# Patient Record
Sex: Female | Born: 2000 | Race: White | Hispanic: No | Marital: Married | Smoking: Never smoker
Health system: Southern US, Community
[De-identification: ages and names within clinical notes are randomized; demographics above are authoritative.]

## PROBLEM LIST (undated history)

## (undated) DIAGNOSIS — E0590 Thyrotoxicosis, unspecified without thyrotoxic crisis or storm: Secondary | ICD-10-CM

## (undated) HISTORY — DX: Thyrotoxicosis, unspecified without thyrotoxic crisis or storm: E05.90

## (undated) HISTORY — PX: NO PAST SURGERIES: SHX2092

---

## 2020-09-17 NOTE — L&D Delivery Note (Addendum)
Delivery Note:   [id number] at [redacted]w[redacted]d  Admitting diagnosis: Encounter for induction of labor [Z34.90] Risks:  History of IUFD Language barrier  Hypothyroidism no meds Anemia affecting pregnancy in third trimester-folate/b12 and Fe deficiency;  Polyhydramnios affecting pregnancy  Onset of labor: 2312 IOL/Augmentation: AROM and Pitocin ROM: 12/28 0103  Complete dilation at 09/13/2021  0102 Onset of pushing at 0108 FHR second stage 105  Analgesia /Anesthesia intrapartum:Epidural  Pushing in lithotomy  position with CNM and L&D staff support, husband present for birth and supportive.  Delivery of a Live born female  Birth Weight:  3430g APGAR: 8, 9  Newborn Delivery   Birth date/time: 09/13/2021 01:22:00 Delivery type: Vaginal, Spontaneous      in cephalic presentation, position ROA. Gentle traction applied. Shoulder dystocia noted and NICU called to room. Maneuvers used McRoberts, suprapubic pressure, and posterior arm delivered. Then baby was delivered and placed onto mom's chest. Baby was pink and crying. Placenta delivered with ease.   APGAR:1 min-8 , 5 min-9   Nuchal Cord: No  Cord double clamped after cessation of pulsation, cut by husband.  Collection of cord blood for typing completed. Cord blood donation-None  Arterial cord blood sample-No    Placenta delivered-Spontaneous  with 3 vessels . Uterotonics: IV Pitocin Placenta to L&D Uterine tone firm-midline  Bleeding light  None  laceration identified.  Episiotomy:None  Local analgesia: N/A  Repair: N/A Est. Blood Loss (mL):300.00   Complications: None  Mom to postpartum.  Baby boy Shoaibullah to Couplet care / Skin to Skin.  Delivery Report:  Review the Delivery Report for details.    Iline Oven White,SNM 09/13/2021, 1:34 AM   I was present and gloved with the student for the entire delivery. I agree with the note above.  Thressa Sheller DNP, CNM  09/13/21  4:17 AM

## 2020-10-18 ENCOUNTER — Ambulatory Visit (HOSPITAL_COMMUNITY)
Admission: EM | Admit: 2020-10-18 | Discharge: 2020-10-18 | Disposition: A | Discharge status: Home / Self Care | Payer: Self-pay | Attending: Emergency Medicine | Admitting: Emergency Medicine

## 2020-10-18 ENCOUNTER — Encounter (HOSPITAL_COMMUNITY): Payer: Self-pay | Admitting: Urgent Care

## 2020-10-18 ENCOUNTER — Other Ambulatory Visit: Payer: Self-pay

## 2020-10-18 DIAGNOSIS — H00015 Hordeolum externum left lower eyelid: Secondary | ICD-10-CM

## 2020-10-18 DIAGNOSIS — Z3202 Encounter for pregnancy test, result negative: Secondary | ICD-10-CM

## 2020-10-18 DIAGNOSIS — N926 Irregular menstruation, unspecified: Secondary | ICD-10-CM

## 2020-10-18 LAB — POCT URINALYSIS DIPSTICK, ED / UC
Bilirubin Urine: NEGATIVE
Glucose, UA: NEGATIVE mg/dL
Leukocytes,Ua: NEGATIVE
Nitrite: NEGATIVE
Protein, ur: NEGATIVE mg/dL
Specific Gravity, Urine: >=1.03 (ref 1.005–1.030)
Urobilinogen, UA: 0.2 mg/dL (ref 0.0–1.0)
pH: 5 (ref 5.0–8.0)

## 2020-10-18 LAB — POC URINE PREG, ED: Preg Test, Ur: NEGATIVE

## 2020-10-18 MED ORDER — ERYTHROMYCIN 5 MG/GM OP OINT: TOPICAL_OINTMENT | OPHTHALMIC | 0 refills | Status: DC

## 2020-10-18 NOTE — Discharge Instructions (Addendum)
Start doing warm compresses 4 times a day. You may get some baby shampoo, dilute this with warm water, and gently wipe your upper and lower eyelid while you are taking a shower. This will help prevent the recurrence of any styes or chalazions. Follow up with ophthalmologist if this does not get better within several weeks Try not to rub your eyes. You may use Systane as often as you want for comfort. Return immediately to the ER for fever above 100.4, if you have pain moving your eyes, any visual changes, nausea, vomiting, headaches, a rash around your eye, or any other concerns.  Go to www.goodrx.com to look up your medications. This will give you a list of where you can find your prescriptions at the most affordable prices. Or ask the pharmacist what the cash price is, or if they have any other discount programs available to help make your medication more affordable. This can be less expensive than what you would pay with insurance.    Below is a list of primary care practices who are taking new patients for you to follow-up with.  [facility] internal medicine clinic Ground Floor - [facility], [address] [place], [place], [place] [address] [phone]  [facility] Primary Care at [facility] [address] [place], [place] [address] [phone]  [facility] [address] [place], [place] [address] [phone]  [facility] [phone] [address] [place] [address]  [facility]: [address] [place] [address]  [phone]  [facility]: [address] [place] [address]   [phone]  [facility] Family Medicine: [address] [place] [address]  [phone]  [facility] : [address] [place] [address] [phone]  [facility] Primary  Care: [address] [place] [address] [phone]  [facility]: [address] [place] [address] [phone]  Dr. Oneal Grout [address] [facility] [place] [address]  [phone]  Dr. Jackie Plum, [facility]. [address] [place], [place] [address]  [phone]

## 2020-10-18 NOTE — ED Triage Notes (Signed)
With assistance of interpreter Pt asked questions for Pt intake. Pt has a bump on Lt lower eye lid . Pt reported both eyes are watery and hurt sometimes. Pt reports she does not wear glasses.

## 2020-10-18 NOTE — ED Provider Notes (Signed)
HPI  SUBJECTIVE:  Jessica Robbins is a 20 y.o. female who presents with 4 days of painful swelling of her left lower eyelid.  She states the pain is located in the eyelid, not the eye.  She reports increased tearing.  No purulent drainage.  No visual changes.  She does not wear contacts or glasses.  Symptoms worse with palpation.  No alleviating factors.  She has not tried anything for this.  Past medical history: None.  LMP: 1.5 months ago.  Unsure if she could be pregnant.  PMD: None.  She arrived from [place] a month and a half ago.  All history obtained through language line.    History reviewed. No pertinent past medical history.  History reviewed. No pertinent surgical history.  History reviewed. No pertinent family history.     No current facility-administered medications for this encounter.  Current Outpatient Medications:  .  erythromycin ophthalmic ointment, 1 cm ribbon to affected eyelid qid x 10 days, Disp: 5 g, Rfl: 0  No Known Allergies   ROS  As noted in HPI.   Physical Exam  BP 113/85 (BP Location: Left Arm)   Pulse 79   Temp 98 F (36.7 C) (Oral)   Resp 16   LMP  (LMP Unknown) Comment: Pt reports unsure if she is preg.  SpO2 97%   Constitutional: Well developed, well nourished, no acute distress Eyes:  EOMI, conjunctiva normal bilaterally PERRLA.  No direct or consensual photophobia.  Large tender incoming stye left lower eyelid.     HENT: Normocephalic, atraumatic,mucus membranes moist Respiratory: Normal inspiratory effort Cardiovascular: Normal rate GI: nondistended skin: No rash, skin intact Musculoskeletal: no deformities Neurologic: Alert & oriented x 3, no focal neuro deficits Psychiatric: Speech and behavior appropriate   ED Course   Medications - No data to display  Orders Placed This Encounter  Procedures  . POC Urinalysis dipstick    Standing Status:   Standing    Number of Occurrences:   1  . POC urine pregnancy    Standing  Status:   Standing    Number of Occurrences:   1    Results for orders placed or performed during the hospital encounter of 10/18/20 (from the past 24 hour(s))  POC Urinalysis dipstick     Status: Abnormal   Collection Time: 10/18/20  4:52 PM  Result Value Ref Range   Glucose, UA NEGATIVE NEGATIVE mg/dL   Bilirubin Urine NEGATIVE NEGATIVE   Ketones, ur TRACE (A) NEGATIVE mg/dL   Specific Gravity, Urine >=1.030 1.005 - 1.030   Hgb urine dipstick TRACE (A) NEGATIVE   pH 5.0 5.0 - 8.0   Protein, ur NEGATIVE NEGATIVE mg/dL   Urobilinogen, UA 0.2 0.0 - 1.0 mg/dL   Nitrite NEGATIVE NEGATIVE   Leukocytes,Ua NEGATIVE NEGATIVE  POC urine pregnancy     Status: None   Collection Time: 10/18/20  4:54 PM  Result Value Ref Range   Preg Test, Ur NEGATIVE NEGATIVE   No results found.  ED Clinical Impression  1. Hordeolum externum of left lower eyelid   2. Missed period   3. Negative pregnancy test      ED Assessment/Plan  Patient is not pregnant.  Patient has a stye in the left lower eyelid.  Advised warm compresses, lid hygiene, erythromycin ointment.  Will order primary care referral for continued care and provide primary care list.  Follow-up with Dr. Allyne Gee, ophthalmology on-call if not better in about 2 weeks  Using the language line,  discussed labs, MDM, treatment plan, and plan for follow-up with patient.  Answered all questions. patient agrees with plan.   Meds ordered this encounter  Medications  . erythromycin ophthalmic ointment    Sig: 1 cm ribbon to affected eyelid qid x 10 days    Dispense:  5 g    Refill:  0    *This clinic note was created using Scientist, clinical (histocompatibility and immunogenetics). Therefore, there may be occasional mistakes despite careful proofreading.   ?    Domenick Gong, MD 10/18/20 1731

## 2020-10-19 ENCOUNTER — Ambulatory Visit (INDEPENDENT_AMBULATORY_CARE_PROVIDER_SITE_OTHER): Payer: Self-pay | Admitting: Obstetrics and Gynecology

## 2020-10-19 ENCOUNTER — Encounter: Payer: Self-pay | Admitting: Obstetrics and Gynecology

## 2020-10-19 DIAGNOSIS — N912 Amenorrhea, unspecified: Secondary | ICD-10-CM | POA: Diagnosis not present

## 2020-10-19 DIAGNOSIS — Z1159 Encounter for screening for other viral diseases: Secondary | ICD-10-CM

## 2020-10-19 NOTE — Progress Notes (Signed)
   History:  Ms. Jessica Robbins is a 20 y.o. [id number] who presents to clinic today for assessment of amenorrhea.    Neg UPT yesterday. No period for past month and a half. She just finished breastfeeding in the past month. Not sexually active at the moment. Reports that her husband 'does not want her getting pregnant' but also that he does not want her taking birth control.   Arrived from [place] 1.5 months ago. Also endorses headaches and fatigue. [place]. Reports that she has had other times in her life where she has missed her period. Endorses food insecurity prior to arrival in [place]. Currently supported, has great host family.   The following portions of the patient's history were reviewed and updated as appropriate: allergies, current medications, family history, past medical history, social history, past surgical history and problem list.  Review of Systems:  Review of Systems  Constitutional: Positive for malaise/fatigue. Negative for chills and fever.  Cardiovascular: Negative for chest pain.  Gastrointestinal: Negative for abdominal pain and diarrhea.  Neurological: Positive for headaches. Negative for dizziness.      Objective:  Physical Exam LMP  (LMP Unknown) Comment: Pt reports unsure if she is preg. Physical Exam Constitutional:      Appearance: Normal appearance.  HENT:     Head:     Comments: Stye left eye  Pulmonary:     Effort: Pulmonary effort is normal.  Abdominal:     General: Abdomen is flat. There is no distension.     Palpations: Abdomen is soft. There is no mass.     Tenderness: There is no abdominal tenderness.  Neurological:     Mental Status: She is alert.       Labs and Imaging Results for orders placed or performed during the hospital encounter of 10/18/20 (from the past 24 hour(s))  POC Urinalysis dipstick     Status: Abnormal   Collection Time: 10/18/20  4:52 PM  Result Value Ref Range   Glucose, UA NEGATIVE  NEGATIVE mg/dL   Bilirubin Urine NEGATIVE NEGATIVE   Ketones, ur TRACE (A) NEGATIVE mg/dL   Specific Gravity, Urine >=1.030 1.005 - 1.030   Hgb urine dipstick TRACE (A) NEGATIVE   pH 5.0 5.0 - 8.0   Protein, ur NEGATIVE NEGATIVE mg/dL   Urobilinogen, UA 0.2 0.0 - 1.0 mg/dL   Nitrite NEGATIVE NEGATIVE   Leukocytes,Ua NEGATIVE NEGATIVE  POC urine pregnancy     Status: None   Collection Time: 10/18/20  4:54 PM  Result Value Ref Range   Preg Test, Ur NEGATIVE NEGATIVE    No results found.   Assessment & Plan:   Amenorrhea - suspect due to stress, recent move to [place]. Also had been breastfeeding until recently. Will check CBC, CMP, TSH given associated symptoms. -also discussed birth control options, etc. She feels safe with husband. Declines BC at this time.    Health Maintenance - welcomed to practice, discussed routine screening. Declined pap smear today. Will also screen for HCV, HIV.   Gita Kudo, MD 10/19/2020 1:05 PM    Casper Harrison, MD [facility] Family Medicine Fellow, [facility] for [facility], [facility]

## 2020-10-19 NOTE — Patient Instructions (Signed)
I think that you are not having a period right now due to stress. This can be a normal reaction to stressful events. If you continue to not have a period for 3 or more months, please follow up for further evaluation. We checked some basic labs today and I will follow up with you with the results.

## 2020-10-19 NOTE — Progress Notes (Signed)
BP 117/81 pulse 82 weight 150 lb 5'1 height  Patient reports period 1.5 months ago and has never missed a period before.

## 2020-10-20 LAB — COMPREHENSIVE METABOLIC PANEL
ALT: 18 IU/L (ref 0–32)
AST: 19 IU/L (ref 0–40)
Albumin/Globulin Ratio: 1.3 (ref 1.2–2.2)
Albumin: 4.7 g/dL (ref 3.9–5.0)
Alkaline Phosphatase: 99 IU/L (ref 44–121)
BUN/Creatinine Ratio: 17 (ref 9–23)
BUN: 9 mg/dL (ref 6–20)
Bilirubin Total: 0.2 mg/dL (ref 0.0–1.2)
CO2: 20 mmol/L (ref 20–29)
Calcium: 9.7 mg/dL (ref 8.7–10.2)
Chloride: 101 mmol/L (ref 96–106)
Creatinine, Ser: 0.54 mg/dL — ABNORMAL LOW (ref 0.57–1.00)
GFR calc Af Amer: 155 mL/min/{1.73_m2} (ref >59)
GFR calc non Af Amer: 134 mL/min/{1.73_m2} (ref >59)
Globulin, Total: 3.7 g/dL (ref 1.5–4.5)
Glucose: 101 mg/dL — ABNORMAL HIGH (ref 65–99)
Potassium: 4.9 mmol/L (ref 3.5–5.2)
Sodium: 139 mmol/L (ref 134–144)
Total Protein: 8.4 g/dL (ref 6.0–8.5)

## 2020-10-20 LAB — HCV AB W REFLEX TO QUANT PCR: HCV Ab: <0.1 s/co ratio (ref 0.0–0.9)

## 2020-10-20 LAB — CBC
Hematocrit: 31.8 % — ABNORMAL LOW (ref 34.0–46.6)
Hemoglobin: 10.8 g/dL — ABNORMAL LOW (ref 11.1–15.9)
MCH: 27.3 pg (ref 26.6–33.0)
MCHC: 34 g/dL (ref 31.5–35.7)
MCV: 80 fL (ref 79–97)
Platelets: 201 10*3/uL (ref 150–450)
RBC: 3.96 x10E6/uL (ref 3.77–5.28)
RDW: 13.4 % (ref 11.7–15.4)
WBC: 5.8 10*3/uL (ref 3.4–10.8)

## 2020-10-20 LAB — HCV INTERPRETATION

## 2020-10-20 LAB — THYROID CASCADE PROFILE: TSH: 19.1 u[IU]/mL — ABNORMAL HIGH (ref 0.450–4.500)

## 2020-10-20 LAB — HIV ANTIBODY (ROUTINE TESTING W REFLEX): HIV Screen 4th Generation wRfx: NONREACTIVE

## 2020-10-20 LAB — THYROXINE (T4) FREE, DIRECT, S: T4,Free (Direct): 0.8 ng/dL — ABNORMAL LOW (ref 0.82–1.77)

## 2020-10-21 ENCOUNTER — Other Ambulatory Visit: Payer: Self-pay | Admitting: Obstetrics and Gynecology

## 2020-10-21 MED ORDER — LEVOTHYROXINE SODIUM 100 MCG PO TABS: 100.0000 ug | ORAL_TABLET | Freq: Every day | ORAL | 11 refills | Status: DC

## 2020-10-24 ENCOUNTER — Telehealth: Payer: Self-pay | Admitting: Lactation Services

## 2020-10-24 NOTE — Telephone Encounter (Signed)
-----   Message from Gita Kudo, MD sent at 10/21/2020  1:56 PM EST ----- Patient's labs demonstrate that she is hypothyroid. I have sent levothyroxine to the pharmacy for her. Please instruct her to start taking this medication in the morning and that she should have a follow up visit with repeat labs in six weeks.    Casper Harrison, MD

## 2020-10-24 NOTE — Telephone Encounter (Signed)
Attempted to call patient with results of Thyroid labs. Not able to get a [name] via [employer] at this time, will need to try again at a later time.

## 2020-10-25 NOTE — Telephone Encounter (Signed)
Called patient with assistance of [employer], # [id number].   Female answered the phone and reports he is on the bus and is on the way to the house and will be there in about 20 minutes. He reports he will be at home the rest of the afternoon and patient can be called at that time. Will need to call again in the afternoon.

## 2020-10-25 NOTE — Telephone Encounter (Signed)
Called pt with [employer] # [phone] and informed pt that her labs show that she has hypothyroidism and that a prescription of Synthroid has been prescribed and that we have sent it her [facility] on [address] [facility] [employer]  Informed pt that someone from the front office will call her with an appt in 6 weeks to make sure that medication dosage effective. I also advised pt that she will take daily in the am.  Pt verbalized understanding.    Leonette Nutting  10/25/20

## 2020-12-06 ENCOUNTER — Ambulatory Visit: Payer: Self-pay | Admitting: Obstetrics and Gynecology

## 2020-12-14 ENCOUNTER — Encounter: Payer: Self-pay | Admitting: Obstetrics and Gynecology

## 2020-12-14 ENCOUNTER — Other Ambulatory Visit: Payer: Self-pay

## 2020-12-14 ENCOUNTER — Ambulatory Visit (INDEPENDENT_AMBULATORY_CARE_PROVIDER_SITE_OTHER): Payer: Self-pay | Admitting: Obstetrics and Gynecology

## 2020-12-14 VITALS — BP 101/66 | HR 80 | Wt 158.6 lb

## 2020-12-14 DIAGNOSIS — E039 Hypothyroidism, unspecified: Secondary | ICD-10-CM

## 2020-12-14 DIAGNOSIS — L299 Pruritus, unspecified: Secondary | ICD-10-CM

## 2020-12-14 DIAGNOSIS — N912 Amenorrhea, unspecified: Secondary | ICD-10-CM

## 2020-12-14 MED ORDER — HYDROXYZINE HCL 25 MG PO TABS: 25.0000 mg | ORAL_TABLET | Freq: Four times a day (QID) | ORAL | 2 refills | Status: DC | PRN

## 2020-12-14 MED ORDER — CETIRIZINE HCL 10 MG PO TABS: 10.0000 mg | ORAL_TABLET | Freq: Every day | ORAL | 0 refills | Status: DC

## 2020-12-14 MED ORDER — HYDROCORTISONE 1 % EX OINT: 1.0000 "application " | TOPICAL_OINTMENT | Freq: Two times a day (BID) | CUTANEOUS | 0 refills | Status: DC

## 2020-12-14 NOTE — Patient Instructions (Addendum)
  Cetaphil moisturizing lotion pr any lotion without a scent  START Zyrtec 10mg  daily Continue Synthroid daily  Take Hydroxyzine AS NEEDED for itching. Can take up to four times daily.   Cetaphil ?  ?    ?~   10 ??   ~?  100 ?~ ??   ? ?~??    ? ?   ~?. ?

## 2020-12-14 NOTE — Progress Notes (Signed)
Patient complains of rashes all over the body, headaches and back pain

## 2020-12-14 NOTE — Progress Notes (Signed)
   History:  Ms. Jessica Robbins is a 20 y.o. [id number] who presents to clinic today for follow up for hypothyroidism.  Patient seen back in February for amenorrhea. Had recently stopped breastfeeding, also had recently migrated from [place] and under significant stress. Labs drawn demonstrated hypothyroidism (TSH 19.100, T4  0.80). She was started on levothyroxine 100 mcg. Reports doing well since taking medication. Period started a few days ago. No reported side effects of medication.   Patient mainly wants to discuss diffuse itching. Says it started when she arrived in [place] and has not stopped. No one else in household is experiencing itching. No bugs or concern for infestation at home. Reports sometimes having a slight red rash on back and extremities but it is mild, never severe.   The following portions of the patient's history were reviewed and updated as appropriate: allergies, current medications, family history, past medical history, social history, past surgical history and problem list.  Review of Systems:  Review of Systems  Constitutional: Negative for chills and fever.  Cardiovascular: Negative for chest pain.  Gastrointestinal: Negative for abdominal pain.  Skin: Positive for itching and rash.      Objective:  Physical Exam There were no vitals taken for this visit. Physical Exam Vitals and nursing note reviewed.  Constitutional:      Appearance: Normal appearance.  Pulmonary:     Effort: Pulmonary effort is normal.  Abdominal:     Palpations: Abdomen is soft.  Skin:    Comments: Areas of excoriation noted on bilateral shoulders, hips. Possibly ?healing macular rash on chest, back. No papules or burrows noted.   Neurological:     Mental Status: She is alert.  Psychiatric:        Mood and Affect: Mood normal.        Behavior: Behavior normal.       Labs and Imaging No results found for this or any previous visit (from the past 24 hour(s)).  No results  found.   Assessment & Plan:  1. Hypothyroidism, unspecified type -recheck Thyroid labs today, continue synthroid   2. Amenorrhea -Improved  3. Pruritus -unclear etiology, possibly atopic dermatitis, seasonal allergies. No clear offending agent but discussed using neutral soaps, detergents. Will start daily zyretc, prn hydroxyzine, topical hydrocortisone. If no improvement would consider referral to dermatology.     Gita Kudo, MD 12/14/2020 8:57 AM    Casper Harrison, MD [facility] Family Medicine Fellow, [facility] for [facility], [facility]

## 2020-12-15 LAB — THYROID PEROXIDASE (TPO) AB: Thyroid Peroxidase (TPO) Ab: 485 IU/mL — ABNORMAL HIGH (ref 0–34)

## 2020-12-15 LAB — THYROID CASCADE PROFILE: TSH: 6.71 u[IU]/mL — ABNORMAL HIGH (ref 0.450–4.500)

## 2020-12-15 LAB — THYROXINE (T4) FREE, DIRECT, S: T4,Free (Direct): 1.23 ng/dL (ref 0.82–1.77)

## 2021-02-07 ENCOUNTER — Ambulatory Visit (INDEPENDENT_AMBULATORY_CARE_PROVIDER_SITE_OTHER): Payer: Medicaid Other

## 2021-02-07 ENCOUNTER — Other Ambulatory Visit: Payer: Self-pay

## 2021-02-07 VITALS — BP 105/65 | HR 90 | Wt 154.5 lb

## 2021-02-07 DIAGNOSIS — Z3201 Encounter for pregnancy test, result positive: Secondary | ICD-10-CM | POA: Diagnosis not present

## 2021-02-07 DIAGNOSIS — R4589 Other symptoms and signs involving emotional state: Secondary | ICD-10-CM

## 2021-02-07 LAB — POCT PREGNANCY, URINE: Preg Test, Ur: POSITIVE — AB

## 2021-02-07 MED ORDER — PROMETHAZINE HCL 25 MG PO TABS: 25.0000 mg | ORAL_TABLET | Freq: Four times a day (QID) | ORAL | 0 refills | Status: DC | PRN

## 2021-02-07 NOTE — Progress Notes (Signed)
Pt here today for pregnancy test resulting positive.  Pt reports last period was two months ago. Per chart review pt reported LMP on 12/14/20 visit of 12/13/20.  Pt denies vaginal bleeding or pain.  Pt reports nausea- Phenergan e-prescribed per standing protocol.  Pt encouraged to start PNV.  Medications/allergies reviewed.  Pt reports dizziness at times.  I advised pt to make sure that she is eating small frequent meals and drinking plenty of water.  Pt advised to start prenatal care.  Pt states that she stopped taking the Synthroid.  I encouraged her to continue taking the Synthroid as pregnancy can change the hormone imbalance and that the provider at her OB visit will reevaluate.  Pt verbalized understanding with no further questions.   Addison Naegeli, RN  02/07/21

## 2021-02-07 NOTE — Progress Notes (Signed)
I agree with the findings and care plan as documented in the RN note.    Franny Selvage, MD OB Family Medicine Fellow, [facility], [facility]  

## 2021-02-23 DIAGNOSIS — Z0289 Encounter for other administrative examinations: Secondary | ICD-10-CM | POA: Diagnosis not present

## 2021-02-23 DIAGNOSIS — Z114 Encounter for screening for human immunodeficiency virus [HIV]: Secondary | ICD-10-CM | POA: Diagnosis not present

## 2021-02-27 ENCOUNTER — Ambulatory Visit (INDEPENDENT_AMBULATORY_CARE_PROVIDER_SITE_OTHER): Payer: Medicaid Other | Admitting: *Deleted

## 2021-02-27 ENCOUNTER — Other Ambulatory Visit: Payer: Self-pay

## 2021-02-27 DIAGNOSIS — Z789 Other specified health status: Secondary | ICD-10-CM | POA: Insufficient documentation

## 2021-02-27 DIAGNOSIS — O0990 Supervision of high risk pregnancy, unspecified, unspecified trimester: Secondary | ICD-10-CM

## 2021-02-27 DIAGNOSIS — Z8759 Personal history of other complications of pregnancy, childbirth and the puerperium: Secondary | ICD-10-CM

## 2021-02-27 NOTE — Progress Notes (Addendum)
New OB Intake  Patient in office today with interpreter. Sponsor in lobby at patient preference.   I explained I am completing New OB Intake today. We discussed her EDD of 09/19/21 that is based on LMP of 12/13/20. Pt is G4/P2102. I reviewed her allergies, medications, Medical/Surgical/OB history, and appropriate screenings. I informed her of [facility] services. Based on history, this is a/an  pregnancy complicated by history IUFD  .  Patient states not taking any medications . Asked her to bring all meds she has taken or takes sometimes to next ob appointment for review.   Patient Active Problem List   Diagnosis Date Noted   Supervision of high risk pregnancy, antepartum 02/27/2021   History of IUFD 02/27/2021   Language barrier 02/27/2021    Concerns addressed today  Delivery Plans:  Plans to deliver at [facility].    Blood Pressure Cuff  Unclear what insurance patient has will follow up at next visit.  Explained after first prenatal appt pt will check  bp weekly   Weight scale: Patient    does not have weight scale. Will need to order Weight scale once insurance clarified.   Anatomy US Explained first scheduled [place] will be around 19 weeks. Anatomy US scheduled for 04/25/21 at 2:15. Pt notified to arrive at 2:00.  Labs Discussed Avelina Laine genetic screening with patient. Patient declines genetic testing.  Routine prenatal labs needed and drawn today.   Covid Vaccine Patient has covid vaccine.   Social Determinants of Health Food Insecurity: Patient denies food insecurity. WIC Referral: Patient is not interested in referral to [facility]. Patient already has WIC.  Transportation: Patient denies transportation needs. Childcare: Discussed no children allowed at ultrasound appointments. Offered childcare services; patient declines childcare services at this time.  First visit review I reviewed new OB appt with pt. I explained she will have a pelvic exam, and PAP smear. Explained pt will be seen by  Nolene Bernheim  at first visit; encounter routed to appropriate provider. Explained that patient will be seen by pregnancy navigator following visit with provider. [facility] information placed in AVS.   Alla Sloma,RN 02/27/2021  4:00 PM    Patient was assessed and managed by nursing staff during this encounter. I have reviewed the chart and agree with the documentation and plan.   Currie Paris, NP 02/27/2021 11:15 PM

## 2021-02-28 LAB — CBC/D/PLT+RPR+RH+ABO+RUBIGG...
Antibody Screen: NEGATIVE
Basophils Absolute: 0 10*3/uL (ref 0.0–0.2)
Basos: 0 %
EOS (ABSOLUTE): 0.3 10*3/uL (ref 0.0–0.4)
Eos: 3 %
HCV Ab: <0.1 s/co ratio (ref 0.0–0.9)
HIV Screen 4th Generation wRfx: NONREACTIVE
Hematocrit: 35.1 % (ref 34.0–46.6)
Hemoglobin: 11.8 g/dL (ref 11.1–15.9)
Hepatitis B Surface Ag: NEGATIVE
Immature Grans (Abs): 0 10*3/uL (ref 0.0–0.1)
Immature Granulocytes: 1 %
Lymphocytes Absolute: 2.2 10*3/uL (ref 0.7–3.1)
Lymphs: 25 %
MCH: 27.5 pg (ref 26.6–33.0)
MCHC: 33.6 g/dL (ref 31.5–35.7)
MCV: 82 fL (ref 79–97)
Monocytes Absolute: 0.6 10*3/uL (ref 0.1–0.9)
Monocytes: 7 %
Neutrophils Absolute: 5.7 10*3/uL (ref 1.4–7.0)
Neutrophils: 64 %
Platelets: 324 10*3/uL (ref 150–450)
RBC: 4.29 x10E6/uL (ref 3.77–5.28)
RDW: 14 % (ref 11.7–15.4)
RPR Ser Ql: NONREACTIVE
Rh Factor: POSITIVE
Rubella Antibodies, IGG: 28.6 index (ref >0.99)
WBC: 8.9 10*3/uL (ref 3.4–10.8)

## 2021-02-28 LAB — HEMOGLOBIN A1C
Est. average glucose Bld gHb Est-mCnc: 111 mg/dL
Hgb A1c MFr Bld: 5.5 % (ref 4.8–5.6)

## 2021-02-28 LAB — HCV INTERPRETATION

## 2021-02-28 NOTE — BH Specialist Note (Signed)
Integrated Behavioral Health Initial In-Person Visit  MRN: [id number] Name: Lakina Mcintire  Number of Integrated Behavioral Health Clinician visits:: 1/6 Session Start time: 9:25  Session End time: 10:30 Total time:  65  minutes  Types of Service: Individual psychotherapy  Interpretor:Yes.   Interpretor Name and Language: Personal assistant, in-person, Hameed Safi   Warm Hand Off Completed.         Subjective: Henreitta Spittler is a 20 y.o. female accompanied by  Sponsor and [name] Patient was referred by Nolene Bernheim, NP for positive depression screening. Patient reports the following symptoms/concerns: Pt states her primary concerns today are:  feeling scared at night alone with children while husband is at work,  wanting to find out gender of baby,  nausea/vomiting,  headache for past two days that did not go away after taking Tylenol,  numbness and tingling in hands and legs,  blue spots coming up on her legs (not bruises) when stressed,  difficulty hearing and seeing upon waking Has not been taking medication prescribed (Synthroid), as it's making her feel more nausea Has not been taking prenatal vitamin Duration of problem: Current pregnancy; Severity of problem:  moderately severe  Objective: Mood: Depressed and Affect: Depressed Risk of harm to self or others: No plan to harm self or others  Life Context: Family and Social: Pt lives with her husband and children (about 1yo and 3yo boys) School/Work: - Self-Care: - Life Changes: Current pregnancy; recent move to [place] from [place]  Patient and/or Family's Strengths/Protective Factors: Sense of purpose  Goals Addressed: Patient will: Reduce symptoms of: anxiety, depression, and stress Increase knowledge and/or ability of: healthy habits  Demonstrate ability to: Increase healthy adjustment to current life circumstances, Increase motivation to adhere to plan of care, and Improve medication  compliance  Progress towards Goals: Ongoing  Interventions: Interventions utilized: Solution-Focused Strategies and Psychoeducation and/or Health Education  Standardized Assessments completed:  PHQ9/GAD7 given in past two weeks  Patient and/or Family Response: Pt agrees to talk to medical person today regarding medical concerns, and agrees to follow up visit with [facility]  Patient Centered Plan: Patient is on the following Treatment Plan(s):  IBH  Assessment: Patient currently experiencing Adjustment disorder with mixed anxious and depressed mood.   Patient may benefit from psychoeducation and brief therapeutic interventions regarding coping with symptoms of depression and anxiety .  Plan: Follow up with behavioral health clinician on : Two weeks Behavioral recommendations:  -Talk to medical provider today regarding medical concerns -Begin taking all medications as prescribed (prenatal vitamin, phenergan, levothyroxine Referral(s): Integrated [employer] [facility] (In Clinic)  [facility] [place], [place]  Depression screen [facility] 2/9 02/27/2021 12/14/2020  Decreased Interest 3 1  Down, Depressed, Hopeless 3 1  PHQ - 2 Score 6 2  Altered sleeping 3 2  Tired, decreased energy 3 2  Change in appetite 3 2  Feeling bad or failure about yourself  0 2  Trouble concentrating 1 2  Moving slowly or fidgety/restless 0 0  Suicidal thoughts 0 0  PHQ-9 Score 16 12   GAD 7 : Generalized Anxiety Score 02/27/2021 12/14/2020  Nervous, Anxious, on Edge 3 2  Control/stop worrying 3 2  Worry too much - different things 3 2  Trouble relaxing 0 2  Restless 0 2  Easily annoyed or irritable 1 2  Afraid - awful might happen 3 2  Total GAD 7 Score 13 14

## 2021-03-01 LAB — CULTURE, OB URINE

## 2021-03-01 LAB — URINE CULTURE, OB REFLEX

## 2021-03-06 ENCOUNTER — Ambulatory Visit (INDEPENDENT_AMBULATORY_CARE_PROVIDER_SITE_OTHER): Payer: Medicaid Other | Admitting: Nurse Practitioner

## 2021-03-06 ENCOUNTER — Other Ambulatory Visit (HOSPITAL_COMMUNITY)
Admission: RE | Admit: 2021-03-06 | Discharge: 2021-03-06 | Disposition: A | Discharge status: Home / Self Care | Payer: Medicaid Other | Source: Ambulatory Visit | Attending: Nurse Practitioner | Admitting: Nurse Practitioner

## 2021-03-06 ENCOUNTER — Other Ambulatory Visit: Payer: Self-pay

## 2021-03-06 VITALS — BP 102/64 | HR 93 | Wt 155.2 lb

## 2021-03-06 DIAGNOSIS — L299 Pruritus, unspecified: Secondary | ICD-10-CM | POA: Diagnosis not present

## 2021-03-06 DIAGNOSIS — Z3A11 11 weeks gestation of pregnancy: Secondary | ICD-10-CM

## 2021-03-06 DIAGNOSIS — Z5941 Food insecurity: Secondary | ICD-10-CM | POA: Diagnosis not present

## 2021-03-06 DIAGNOSIS — R519 Headache, unspecified: Secondary | ICD-10-CM

## 2021-03-06 DIAGNOSIS — O0990 Supervision of high risk pregnancy, unspecified, unspecified trimester: Secondary | ICD-10-CM | POA: Diagnosis not present

## 2021-03-06 DIAGNOSIS — O26899 Other specified pregnancy related conditions, unspecified trimester: Secondary | ICD-10-CM | POA: Diagnosis not present

## 2021-03-06 DIAGNOSIS — O99891 Other specified diseases and conditions complicating pregnancy: Secondary | ICD-10-CM

## 2021-03-06 DIAGNOSIS — E039 Hypothyroidism, unspecified: Secondary | ICD-10-CM | POA: Diagnosis not present

## 2021-03-06 DIAGNOSIS — Z124 Encounter for screening for malignant neoplasm of cervix: Secondary | ICD-10-CM | POA: Insufficient documentation

## 2021-03-06 DIAGNOSIS — M549 Dorsalgia, unspecified: Secondary | ICD-10-CM | POA: Diagnosis not present

## 2021-03-06 DIAGNOSIS — Z603 Acculturation difficulty: Secondary | ICD-10-CM

## 2021-03-06 DIAGNOSIS — Z789 Other specified health status: Secondary | ICD-10-CM

## 2021-03-06 MED ORDER — VITAFOL ULTRA 29-0.6-0.4-200 MG PO CAPS: 1.0000 | ORAL_CAPSULE | Freq: Every day | ORAL | 11 refills | Status: AC

## 2021-03-06 MED ORDER — BLOOD PRESSURE KIT DEVI: 1.0000 | 0 refills | Status: DC

## 2021-03-06 MED ORDER — ACETAMINOPHEN 325 MG PO TABS: 650.0000 mg | ORAL_TABLET | Freq: Four times a day (QID) | ORAL | 1 refills | Status: DC | PRN

## 2021-03-06 MED ORDER — DOXYLAMINE-PYRIDOXINE 10-10 MG PO TBEC: DELAYED_RELEASE_TABLET | ORAL | 2 refills | Status: DC

## 2021-03-06 NOTE — Progress Notes (Signed)
Subjective:   Jessica Robbins is a 20 y.o. [id number] at 24w6dby LMP being seen today for her first obstetrical visit.  Her obstetrical history is significant for  refugee from [place] language barrier, hx of IUFD in first pregnancy, hypothyroidism - not currently taking medication . Patient does intend to breast feed. Pregnancy history fully reviewed.  Patient reports  headache for 3 days, nausea and vomiting especially when she is cooking .  HISTORY: OB History  Gravida Para Term Preterm AB Living  _0 0 2  SAB IAB Ectopic Multiple Live Births  0 0 0 0 2    # Outcome Date GA Lbr Len/2nd Weight Sex Delivery Anes PTL Lv  4 Current           3 Preterm  310w0d  Vag-Spont   FD  2 Term      Vag-Spont   LIV  1 Term      Vag-Spont   LIV   Past Medical History:  Diagnosis Date   Hyperthyroidism    Past Surgical History:  Procedure Laterality Date   NO PAST SURGERIES     No family history on file. Social History   Tobacco Use   Smoking status: Never   Smokeless tobacco: Never  Vaping Use   Vaping Use: Never used  Substance Use Topics   Alcohol use: Never   Drug use: Never   No Known Allergies Current Outpatient Medications on File Prior to Visit  Medication Sig Dispense Refill   cetirizine (ZYRTEC ALLERGY) 10 MG tablet Take 1 tablet (10 mg total) by mouth daily. (Patient not taking: No sig reported) 30 tablet 0   erythromycin ophthalmic ointment 1 cm ribbon to affected eyelid qid x 10 days (Patient not taking: No sig reported) 5 g 0   hydrocortisone 1 % ointment Apply 1 application topically 2 (two) times daily. (Patient not taking: No sig reported) 30 g 0   hydrOXYzine (ATARAX/VISTARIL) 25 MG tablet Take 1 tablet (25 mg total) by mouth every 6 (six) hours as needed for itching. (Patient not taking: Reported on 03/06/2021) 30 tablet 2   levothyroxine (SYNTHROID) 100 MCG tablet Take 1 tablet (100 mcg total) by mouth daily. (Patient not taking: Reported on 03/06/2021) 30  tablet 11   promethazine (PHENERGAN) 25 MG tablet Take 1 tablet (25 mg total) by mouth every 6 (six) hours as needed for nausea or vomiting. (Patient not taking: No sig reported) 30 tablet 0   No current facility-administered medications on file prior to visit.     Exam   Vitals:   03/06/21 0911  BP: 102/64  Pulse: 93  Weight: 155 lb 3.2 oz (70.4 kg)   Fetal Heart Rate (bpm): 156  Uterus:     Pelvic Exam: Perineum: no hemorrhoids, normal perineum   Vulva: normal external genitalia, no lesions   Vagina:  normal mucosa, normal discharge   Cervix: no lesions and normal, pap smear done.    Adnexa: normal adnexa and no mass, fullness, tenderness   Bony Pelvis: average  System: General: well-developed, well-nourished female in no acute distress   Breast:  normal appearance, no masses or tenderness   Skin: normal coloration and turgor, no rashes   Neurologic: oriented, normal, negative, normal mood   Extremities: normal strength, tone, and muscle mass, ROM of all joints is normal   HEENT extraocular movement intact and sclera clear, anicteric   Mouth/Teeth mucous membranes moist, pharynx normal without lesions  and dental hygiene goo   Neck supple and no masses, normal thyroid   Cardiovascular: regular rate and rhythm   Respiratory:  no respiratory distress, normal breath sounds   Abdomen: soft, non-tender; no masses,  no organomegaly     Assessment:   Pregnancy: [id number] Patient Active Problem List   Diagnosis Date Noted   Supervision of high risk pregnancy, antepartum 02/27/2021   History of IUFD 02/27/2021   Language barrier 02/27/2021     Plan:  1. Supervision of high risk pregnancy, antepartum Has had a dental cleaning. Consents to Pap smear today - she was quite anxious and expecting it to be painful, but tolerated well and reports it was not painful.  Cervix barely visualized, but pap completed.  - CBC/D/Plt+RPR+Rh+ABO+RubIgG... - Hemoglobin A1c - Culture, OB  Urine - Genetic Screening - TSH - T3 - T4, free - Blood Pressure Monitoring (BLOOD PRESSURE KIT) DEVI; 1 each by Does not apply route once a week.  Dispense: 1 each; Refill: 0  2. Language barrier In person interpreter present for the entire visit Requests AVS to be entered into google translate but office is not currently using this process  3. Headache in pregnancy, antepartum Likely cause is dehydration and lack of food due to nausea. Has not taken any medication to relieve headache. Drink at least 8 8-oz glasses of water every day. Take Tylenol 325 mg 2 tablets by mouth every 4 hours if needed for pain. Client has many ideas about her headache - thinking it came from hydroxyzine and seems reluctant to try these measures to relieve her headache  4. Back pain affecting pregnancy, antepartum Reports a fall in the past few months that is continuing to cause back pain and cramping No bruising seen on her back Likely her cramping is from mild dehydration but client does not make the connection  5. Itching Does not take the prescribed meds for itching No rash seen - skin clear Perhaps itching is due to dry skin from hypothyroidism  6. Hypothyroidism, unspecified type Only took one pill of levothroxine when prescribed - stopped taking as she was not sure she needed it. Reviewed the importance of taking this medication daily.  - TSH - T3 - T4, free  7. Food insecurity Went to the food market at her visit today.  - AMBULATORY REFERRAL TO [facility]  8.  History of IUFD This was her first pregnancy with stillbirth at 53 months of pregnancy. Has had 2 normal pregnancies since then. Has 2 boys who are almost 22 year old and 22 years of age.  Initial labs drawn. Continue prenatal vitamins. Genetic Screening discussed, NIPS: ordered. Ultrasound discussed; fetal anatomic survey:  scheduled . Problem list reviewed and updated. The nature of [facility] with multiple MDs and other Advanced Practice Providers was explained to patient; also emphasized that residents, students are part of our team. Routine obstetric precautions reviewed. Return in about 4 weeks (around 04/03/2021) for in person ROB .  Total face-to-face time with patient: 40 minutes.  Over 50% of encounter was spent on counseling and coordination of care.     Earlie Server, FNP Family Nurse Practitioner, [facility] for [employer], [facility] 03/06/2021 12:14 PM

## 2021-03-06 NOTE — Patient Instructions (Addendum)
64 ounces of water a day is needed to treat a headache Take 2 tablets of Tylenol every 6 hours as needed for headache. Take your Levothyroxine every day Vitalfol is the prenatal vitamin to take every day.

## 2021-03-06 NOTE — Progress Notes (Signed)
Patient complains of period like cramps in pelvic and that she experience this pain everyday along with lower back pains. Patient also stated that she has been having intense headaches for that past couple of days  Jessica Robbins [facility]  03/06/21

## 2021-03-07 LAB — HCV INTERPRETATION

## 2021-03-07 LAB — CBC/D/PLT+RPR+RH+ABO+RUBIGG...
Antibody Screen: NEGATIVE
Basophils Absolute: 0 10*3/uL (ref 0.0–0.2)
Basos: 0 %
EOS (ABSOLUTE): 0.2 10*3/uL (ref 0.0–0.4)
Eos: 3 %
HCV Ab: <0.1 s/co ratio (ref 0.0–0.9)
HIV Screen 4th Generation wRfx: NONREACTIVE
Hematocrit: 34.6 % (ref 34.0–46.6)
Hemoglobin: 12 g/dL (ref 11.1–15.9)
Hepatitis B Surface Ag: NEGATIVE
Immature Grans (Abs): 0 10*3/uL (ref 0.0–0.1)
Immature Granulocytes: 0 %
Lymphocytes Absolute: 1.7 10*3/uL (ref 0.7–3.1)
Lymphs: 20 %
MCH: 28 pg (ref 26.6–33.0)
MCHC: 34.7 g/dL (ref 31.5–35.7)
MCV: 81 fL (ref 79–97)
Monocytes Absolute: 0.6 10*3/uL (ref 0.1–0.9)
Monocytes: 7 %
Neutrophils Absolute: 5.9 10*3/uL (ref 1.4–7.0)
Neutrophils: 70 %
Platelets: 329 10*3/uL (ref 150–450)
RBC: 4.28 x10E6/uL (ref 3.77–5.28)
RDW: 12.9 % (ref 11.7–15.4)
RPR Ser Ql: NONREACTIVE
Rh Factor: POSITIVE
Rubella Antibodies, IGG: 26.5 index (ref >0.99)
WBC: 8.5 10*3/uL (ref 3.4–10.8)

## 2021-03-07 LAB — HEMOGLOBIN A1C
Est. average glucose Bld gHb Est-mCnc: 105 mg/dL
Hgb A1c MFr Bld: 5.3 % (ref 4.8–5.6)

## 2021-03-07 LAB — T4, FREE: Free T4: 1 ng/dL (ref 0.82–1.77)

## 2021-03-07 LAB — T3: T3, Total: 233 ng/dL — ABNORMAL HIGH (ref 71–180)

## 2021-03-07 LAB — TSH: TSH: 4.23 u[IU]/mL (ref 0.450–4.500)

## 2021-03-08 ENCOUNTER — Encounter (HOSPITAL_COMMUNITY): Payer: Self-pay | Admitting: Emergency Medicine

## 2021-03-08 ENCOUNTER — Other Ambulatory Visit: Payer: Self-pay | Admitting: *Deleted

## 2021-03-08 ENCOUNTER — Emergency Department (HOSPITAL_COMMUNITY)
Admission: EM | Admit: 2021-03-08 | Discharge: 2021-03-08 | Disposition: A | Discharge status: Home / Self Care | Payer: Medicaid Other | Attending: Emergency Medicine | Admitting: Emergency Medicine

## 2021-03-08 ENCOUNTER — Encounter: Payer: Self-pay | Admitting: *Deleted

## 2021-03-08 ENCOUNTER — Ambulatory Visit (INDEPENDENT_AMBULATORY_CARE_PROVIDER_SITE_OTHER): Payer: Medicaid Other | Admitting: Clinical

## 2021-03-08 ENCOUNTER — Other Ambulatory Visit: Payer: Self-pay

## 2021-03-08 DIAGNOSIS — M79641 Pain in right hand: Secondary | ICD-10-CM | POA: Diagnosis not present

## 2021-03-08 DIAGNOSIS — O219 Vomiting of pregnancy, unspecified: Secondary | ICD-10-CM | POA: Insufficient documentation

## 2021-03-08 DIAGNOSIS — M79606 Pain in leg, unspecified: Secondary | ICD-10-CM | POA: Insufficient documentation

## 2021-03-08 DIAGNOSIS — Z3201 Encounter for pregnancy test, result positive: Secondary | ICD-10-CM

## 2021-03-08 DIAGNOSIS — O26891 Other specified pregnancy related conditions, first trimester: Secondary | ICD-10-CM | POA: Diagnosis not present

## 2021-03-08 DIAGNOSIS — R202 Paresthesia of skin: Secondary | ICD-10-CM | POA: Diagnosis not present

## 2021-03-08 DIAGNOSIS — Z3A11 11 weeks gestation of pregnancy: Secondary | ICD-10-CM | POA: Diagnosis not present

## 2021-03-08 DIAGNOSIS — O99891 Other specified diseases and conditions complicating pregnancy: Secondary | ICD-10-CM | POA: Diagnosis not present

## 2021-03-08 DIAGNOSIS — R2240 Localized swelling, mass and lump, unspecified lower limb: Secondary | ICD-10-CM | POA: Diagnosis not present

## 2021-03-08 DIAGNOSIS — R230 Cyanosis: Secondary | ICD-10-CM | POA: Diagnosis not present

## 2021-03-08 DIAGNOSIS — F4323 Adjustment disorder with mixed anxiety and depressed mood: Secondary | ICD-10-CM | POA: Diagnosis not present

## 2021-03-08 LAB — CYTOLOGY - PAP
Chlamydia: NEGATIVE
Comment: NEGATIVE
Comment: NORMAL
Diagnosis: NEGATIVE
Neisseria Gonorrhea: NEGATIVE

## 2021-03-08 LAB — CULTURE, OB URINE

## 2021-03-08 LAB — URINE CULTURE, OB REFLEX

## 2021-03-08 MED ORDER — PROMETHAZINE HCL 25 MG PO TABS: 25.0000 mg | ORAL_TABLET | Freq: Four times a day (QID) | ORAL | 0 refills | Status: DC | PRN

## 2021-03-08 NOTE — ED Provider Notes (Signed)
[facility] EMERGENCY DEPARTMENT Provider Note   CSN: [id number] Arrival date & time: 03/08/21  1101     History No chief complaint on file.   Jessica Robbins is a 20 y.o. female.  HPI Patient is a 20 year old G21 female who is about [redacted] weeks pregnant who presents to the emergency department with multiple complaints.  History obtained via [name].  She states she has been experiencing intermittent numbness and tingling in the hands that is worse with activity.  Also complaining of leg cramping as well as intermittent leg discoloration.  She states that when she stands for long periods of time she will get leg swelling but this quickly resolves when off of her feet.  She is right-hand dominant.  No trauma.  She has been also experiencing intermittent nausea and vomiting since she became pregnant.  She was taking doxylamine pyridoxine but when her nausea/vomiting resolved she discontinued taking this medication.  Patient is a refugee from [place] and her sponsor is at bedside.  Her sponsor states that she had an abnormal TSH and was started on Synthroid but she discontinued this medication due to worsening nausea/vomiting when taking it.  She states that she recently had her TSH rechecked and was told she could discontinue this.  She denies any URI symptoms, chest pain, shortness of breath, abdominal pain, vaginal bleeding, vaginal discharge.    Past Medical History:  Diagnosis Date   Hyperthyroidism     Patient Active Problem List   Diagnosis Date Noted   Supervision of high risk pregnancy, antepartum 02/27/2021   History of IUFD 02/27/2021   Language barrier 02/27/2021    Past Surgical History:  Procedure Laterality Date   NO PAST SURGERIES       OB History     Gravida  4   Para  3   Term  2   Preterm  1   AB  0   Living  2      SAB  0   IAB  0   Ectopic  0   Multiple  0   Live Births  2           No family history on  file.  Social History   Tobacco Use   Smoking status: Never   Smokeless tobacco: Never  Vaping Use   Vaping Use: Never used  Substance Use Topics   Alcohol use: Never   Drug use: Never    Home Medications Prior to Admission medications   Medication Sig Start Date End Date Taking? Authorizing Provider  acetaminophen (TYLENOL) 325 MG tablet Take 2 tablets (650 mg total) by mouth every 6 (six) hours as needed. 03/06/21   Virginia Rochester, NP  Blood Pressure Monitoring (BLOOD PRESSURE KIT) DEVI 1 each by Does not apply route once a week. 03/06/21   Burleson, Rona Ravens, NP  cetirizine (ZYRTEC ALLERGY) 10 MG tablet Take 1 tablet (10 mg total) by mouth daily. Patient not taking: No sig reported 12/14/20   Janet Berlin, MD  Doxylamine-Pyridoxine (DICLEGIS) 10-10 MG TBEC Take 2 tablets at bedtime and one in the morning and one in the afternoon as needed for nausea. 03/06/21   Virginia Rochester, NP  erythromycin ophthalmic ointment 1 cm ribbon to affected eyelid qid x 10 days Patient not taking: No sig reported 10/18/20   Melynda Ripple, MD  hydrocortisone 1 % ointment Apply 1 application topically 2 (two) times daily. Patient not taking: No sig reported 12/14/20  Janet Berlin, MD  hydrOXYzine (ATARAX/VISTARIL) 25 MG tablet Take 1 tablet (25 mg total) by mouth every 6 (six) hours as needed for itching. Patient not taking: Reported on 03/06/2021 12/14/20   Janet Berlin, MD  levothyroxine (SYNTHROID) 100 MCG tablet Take 1 tablet (100 mcg total) by mouth daily. Patient not taking: Reported on 03/06/2021 10/21/20 10/21/21  Janet Berlin, MD  Prenat-Fe Poly-Methfol-FA-DHA (VITAFOL ULTRA) 29-0.6-0.4-200 MG CAPS Take 1 capsule by mouth daily. 03/06/21 04/05/21  Virginia Rochester, NP  promethazine (PHENERGAN) 25 MG tablet Take 1 tablet (25 mg total) by mouth every 6 (six) hours as needed for nausea or vomiting. 03/08/21   Griffin Basil, MD    Allergies    Patient has no known  allergies.  Review of Systems   Review of Systems  All other systems reviewed and are negative. Ten systems reviewed and are negative for acute change, except as noted in the HPI.   Physical Exam Updated Vital Signs BP (!) 101/59 (BP Location: Right Arm)   Pulse 78   Temp (!) 97.4 F (36.3 C) (Oral)   Resp 15   LMP 12/13/2020 (Exact Date)   SpO2 99%   Physical Exam Vitals and nursing note reviewed.  Constitutional:      General: She is not in acute distress.    Appearance: Normal appearance. She is normal weight. She is not ill-appearing, toxic-appearing or diaphoretic.  HENT:     Head: Normocephalic and atraumatic.     Right Ear: External ear normal.     Left Ear: External ear normal.     Nose: Nose normal.     Mouth/Throat:     Mouth: Mucous membranes are moist.     Pharynx: Oropharynx is clear. No oropharyngeal exudate or posterior oropharyngeal erythema.  Eyes:     Extraocular Movements: Extraocular movements intact.  Cardiovascular:     Rate and Rhythm: Normal rate and regular rhythm.     Pulses: Normal pulses.  Pulmonary:     Effort: Pulmonary effort is normal.  Abdominal:     General: Abdomen is flat.     Palpations: Abdomen is soft.     Tenderness: There is no abdominal tenderness.  Musculoskeletal:        General: Normal range of motion.     Cervical back: Normal range of motion and neck supple. No tenderness.     Right lower leg: No edema.     Left lower leg: No edema.     Comments: No pedal edema appreciated.  2+ DP pulses.  No discoloration noted in the lower extremities.  Skin:    General: Skin is warm and dry.  Neurological:     General: No focal deficit present.     Mental Status: She is alert and oriented to person, place, and time.     Comments: A&O x3.  Moving all 4 extremities with ease.  Strength is 5/5 in all 4 extremities.  Distal sensation intact.  Positive Tinel's sign in the right wrist.  Psychiatric:        Mood and Affect: Mood normal.         Behavior: Behavior normal.   ED Results / Procedures / Treatments   Labs (all labs ordered are listed, but only abnormal results are displayed) Labs Reviewed - No data to display  EKG None  Radiology No results found.  Procedures Procedures   Medications Ordered in ED Medications - No data to display  ED Course  I have reviewed the triage vital signs and the nursing notes.  Pertinent labs & imaging results that were available during my care of the patient were reviewed by me and considered in my medical decision making (see chart for details).    MDM Rules/Calculators/A&P                          Patient is a 20 year old female who presents to the emergency department due to numbness/tingling in the hands as well as intermittent leg cramping.  Symptoms all worsen with activity.  Physical exam mostly reassuring.  She does have a positive Tinel's sign on the right.  She is also right-hand dominant and states that she frequently makes bread and her symptoms worsen after doing this.  Feel that she likely is experiencing a degree of carpal tunnel syndrome in both hands, right greater than left.  We discussed stretching the wrist.  Reduction in activity.  She was given a wrist splint for comfort.  She is a refugee from [place] and her sponsor is at bedside.  Her sponsor states she is going to speak to her husband about helping out with chores around the home and states that she will also help find her a bread maker to help reduce stress to the hands.  Patient also experiencing intermittent nausea and vomiting since her pregnancy began.  She was taking doxylamine pyridoxine but states that she discontinued this medication when her nausea/vomiting resolved.  I told patient as well as her sponsor that she needs to restart this medication and for it to adequately work she will need to continually take this.  Per records, it appears that she was also prescribed a course of Phenergan  for nausea/vomiting.  Per records, patient had her TSH and T4 rechecked 2 days ago and they were within normal limits.  Agreed that patient can discontinue synthroid and discussed this with patient and her sponsor.   Recommended that she follow-up with her OB/GYN as needed.  Return to the emergency department if her symptoms should worsen.  Feel that she is stable for discharge at this time and both patient and her sponsor are agreeable.  They verbalized understanding of the above plan.  Their questions were answered and they were amicable at the time of discharge.  Final Clinical Impression(s) / ED Diagnoses Final diagnoses:  Right hand pain   Rx / DC Orders ED Discharge Orders     None        Rayna Sexton, PA-C 03/08/21 1251    Dorie Rank, MD 03/09/21 [phone]

## 2021-03-08 NOTE — ED Triage Notes (Signed)
Pt here from home with c/o right arm pain and numbness along with pain in her legs when she walks ,pt is [redacted] weeks pregnant

## 2021-03-08 NOTE — Progress Notes (Addendum)
Patient in office seeing [facility]  with complaints of headache, pain and numbness, blue spot on leg, needs refill. Asked to talk to patient about these complaints. Jessica Robbins has her sponsor present and Interpreter [employer], Language resources. Jessica Robbins c/o headache for a few days,severe at times, takes tylenol sometimes without relief. Also requests refill phenergan, which I informed her I can send in. She also c/o pain in right shoulder and numbness in legs , right hand sometimes. I discussed with Dr. Donavan Foil and we discussed she is [redacted] weeks pregnant with hypothyroid recently restarted meds.  We discussed this is likely not an ob problem and we recommend she go to [facility] ER to be evaluated for the pain and numbness, and headache. Patient worried about her other children. Her sponsor said she will work with husband to work something out and will take patient to [facility] ER now. Patient voices understanding. Kari Kerth,RN  Addendum: patient walking, talking, without difficulty. Shyheem Whitham,RN

## 2021-03-08 NOTE — Discharge Instructions (Addendum)
I have placed you in a right wrist splint.  Please wear this throughout the day for support and hopefully to help with your pain.  I would recommend you continue stretching the right wrist as well.  If you develop worsening symptoms you can always come back to the emergency department.  Please restart taking your nausea/vomiting medication that you are prescribed by your doctor.  It was a pleasure to meet you both.  ###############  ??? ????? ?? ????? ????? ??????. ???? ?????? ??? ???? ????? ?????? ??? ????? ????? ?? ????? ?? ????. ??? ?? ????? ?????????? ?? ?? ???? ?????? ?????.  ??? ???? ???? ????? ??????? ? ?????? ?????? ?????? ??? ??? ???????.  ???? ????? ????? ???? ??????? / ????? ???? ???? ?? ?????.  ??? ?? ????? ????? ?????????. laqad wadaetuk fi jabirat miesamuk alyumnaa. yurjaa airtida'an hadha tawal alyawm lilhusul ealaa aldaem wanamal 'an tusaeid fi 'alumka. 'awadu 'an 'uwsiak bialaistimrar fi madi rusghik al'ayman aydan.

## 2021-03-14 NOTE — BH Specialist Note (Deleted)
Integrated Behavioral Health Follow Up In-Person Visit  MRN: [id number] Name: Jessica Robbins  Number of Integrated Behavioral Health Clinician visits: 2/6 Session Start time: 2:45***  Session End time: 3:45*** Total time: {IBH Total Time:21014050} minutes  Types of Service: Individual psychotherapy  Interpretor:Yes.   Interpretor Name and Language: ***  Subjective: Jessica Robbins is a 20 y.o. female accompanied by {Patient accompanied [phone]} Patient was referred by *** for ***. Patient reports the following symptoms/concerns: *** Duration of problem: ***; Severity of problem: {Mild/Moderate/Severe:20260}  Objective: Mood: {BHH MOOD:22306} and Affect: {BHH AFFECT:22307} Risk of harm to self or others: {CHL AMB BH Suicide Current Mental Status:21022748}  Life Context: Family and Social: *** School/Work: *** Self-Care: *** Life Changes: ***  Patient and/or Family's Strengths/Protective Factors: {CHL AMB BH PROTECTIVE FACTORS:[phone]}  Goals Addressed: Patient will:  Reduce symptoms of: {IBH Symptoms:21014056}   Increase knowledge and/or ability of: {IBH Patient Tools:21014057}   Demonstrate ability to: {IBH Goals:21014053}  Progress towards Goals: {CHL AMB BH PROGRESS TOWARDS [phone]}  Interventions: Interventions utilized:  {IBH Interventions:21014054} Standardized Assessments completed: {IBH Screening Tools:21014051}  Patient and/or Family Response: ***  Patient Centered Plan: Patient is on the following Treatment Plan(s): *** Assessment: Patient currently experiencing ***.   Patient may benefit from ***.  Plan: Follow up with behavioral health clinician on : *** Behavioral recommendations: *** Referral(s): {IBH Referrals:21014055} "From scale of 1-10, how likely are you to follow plan?": ***  Jessica Close Mareta Chesnut, LCSW

## 2021-03-16 ENCOUNTER — Other Ambulatory Visit: Payer: Self-pay

## 2021-03-17 ENCOUNTER — Encounter: Payer: Self-pay | Admitting: *Deleted

## 2021-03-23 ENCOUNTER — Other Ambulatory Visit: Payer: Self-pay

## 2021-03-23 ENCOUNTER — Encounter: Payer: Self-pay | Admitting: *Deleted

## 2021-03-28 ENCOUNTER — Other Ambulatory Visit: Payer: Self-pay

## 2021-03-29 NOTE — BH Specialist Note (Addendum)
Integrated Behavioral Health Follow Up In-Person Visit  MRN: [id number] Name: Jessica Robbins  Number of Integrated Behavioral Health Clinician visits: 2/6 Session Start time: 9:30  Session End time: 10:30 Total time: 60 minutes  Types of Service: Individual psychotherapy  Interpretor:Yes.   Interpretor Name and Language: In-person, Personal assistant (did not get name)  Subjective: Jessica Robbins is a 20 y.o. female accompanied by  [name] Interpreter Patient was referred by Nolene Bernheim, NP for anxiety and depression. Patient reports the following symptoms/concerns: Pt states her primary concerns today are fear of "feeling like someone is behind me", difficulty with memory, lack of concentration, anxious, worry, depressed, sleep difficulty (5-6 hours throughout day/night), and numbness in hands prior to headache; noticing more nausea and vomiting with headache; symptoms all began after arriving in [place].  Duration of problem: Increase over past 6 months; Severity of problem:  moderately severe  Objective: Mood: Anxious and Depressed and Affect: Appropriate Risk of harm to self or others: No plan to harm self or others  Life Context: Family and Social: Pt lives with her husband and children (1yo; 3yo boys) School/Work: - Self-Care: - Life Changes: Move to the [place] from [place] and current pregnancy   Patient and/or Family's Strengths/Protective Factors: Social connections  Goals Addressed: Patient will:  Reduce symptoms of: anxiety, depression, insomnia, and stress   Increase knowledge and/or ability of: healthy habits and self-management skills   Demonstrate ability to: Increase healthy adjustment to current life circumstances and Increase motivation to adhere to plan of care  Progress towards Goals: Ongoing  Interventions: Interventions utilized:  Mindfulness or Management consultant, Sleep Hygiene, and Psychoeducation and/or Health Education Standardized Assessments completed: Not  Needed  Patient and/or Family Response: Pt agrees to treatment plan  Patient Centered Plan: Patient is on the following Treatment Plan(s): IBH Assessment: Patient currently experiencing continued psychoeducation and brief therapeutic interventions regarding coping with symptoms of anxiety and depression .   Patient may benefit from continued psychoeducation and brief therapeutic interventions regarding coping with symptoms of anxiety, depression, stress .  Plan: Follow up with behavioral health clinician on : Two weeks Behavioral recommendations:  -CALM relaxation breathing exercise twice daily(morning; at bedtime); consider using this when feeling stress in body -Begin taking prenatal vitamin and prescribed medications as directed by medical provider -Consider keeping track of headache days (how often are they happening and does anything make them better or worse?) Referral(s): Integrated [facility] (In Clinic)  Valetta Close [place], [place]  Depression screen [facility] 2/9 03/06/2021 02/27/2021 12/14/2020  Decreased Interest 1 3 1   Down, Depressed, Hopeless 1 3 1   PHQ - 2 Score 2 6 2   Altered sleeping 1 3 2   Tired, decreased energy 1 3 2   Change in appetite 0 3 2  Feeling bad or failure about yourself  0 0 2  Trouble concentrating 3 1 2   Moving slowly or fidgety/restless 0 0 0  Suicidal thoughts 0 0 0  PHQ-9 Score 7 16 12    GAD 7 : Generalized Anxiety Score 03/06/2021 02/27/2021 12/14/2020  Nervous, Anxious, on Edge 2 3 2   Control/stop worrying 2 3 2   Worry too much - different things 2 3 2   Trouble relaxing 2 0 2  Restless 2 0 2  Easily annoyed or irritable 2 1 2   Afraid - awful might happen 2 3 2   Total GAD 7 Score 14 13 14

## 2021-04-04 ENCOUNTER — Other Ambulatory Visit: Payer: Self-pay

## 2021-04-04 ENCOUNTER — Encounter: Payer: Medicaid Other | Admitting: Student

## 2021-04-04 ENCOUNTER — Ambulatory Visit (INDEPENDENT_AMBULATORY_CARE_PROVIDER_SITE_OTHER): Payer: Medicaid Other | Admitting: Student

## 2021-04-04 VITALS — BP 108/69 | HR 90 | Wt 155.3 lb

## 2021-04-04 DIAGNOSIS — E0590 Thyrotoxicosis, unspecified without thyrotoxic crisis or storm: Secondary | ICD-10-CM | POA: Insufficient documentation

## 2021-04-04 DIAGNOSIS — O0990 Supervision of high risk pregnancy, unspecified, unspecified trimester: Secondary | ICD-10-CM

## 2021-04-04 DIAGNOSIS — Z8759 Personal history of other complications of pregnancy, childbirth and the puerperium: Secondary | ICD-10-CM

## 2021-04-04 DIAGNOSIS — Z3A16 16 weeks gestation of pregnancy: Secondary | ICD-10-CM

## 2021-04-04 LAB — POCT URINALYSIS DIP (DEVICE)
Bilirubin Urine: NEGATIVE
Glucose, UA: NEGATIVE mg/dL
Hgb urine dipstick: NEGATIVE
Ketones, ur: NEGATIVE mg/dL
Leukocytes,Ua: NEGATIVE
Nitrite: NEGATIVE
Protein, ur: NEGATIVE mg/dL
Specific Gravity, Urine: 1.02 (ref 1.005–1.030)
Urobilinogen, UA: 0.2 mg/dL (ref 0.0–1.0)
pH: 7 (ref 5.0–8.0)

## 2021-04-04 NOTE — Progress Notes (Signed)
   PRENATAL VISIT NOTE  Subjective:  Jessica Robbins is a 20 y.o. [id number] at [redacted]w[redacted]d being seen today for ongoing prenatal care.  She is currently monitored for the following issues for this high-risk pregnancy and has Supervision of high risk pregnancy, antepartum; History of IUFD; Language barrier; and Hyperthyroidism on their problem list.  Patient reports backache and feeling dizzy, anxious.  . She reports no appetite. She reports that she is only taking prenatal and "another one". SHe does not take her other medicines: vistaril, certizine, synthroid. She does not know when she last took her medicine. She has a rash that is bothering her but she has not been using her cream.   Contractions: Not present. Vag. Bleeding: None.  Movement: Absent. Denies leaking of fluid.   The following portions of the patient's history were reviewed and updated as appropriate: allergies, current medications, past family history, past medical history, past social history, past surgical history and problem list.   Objective:   Vitals:   04/04/21 0905  BP: 108/69  Pulse: 90  Weight: 155 lb 4.8 oz (70.4 kg)    Fetal Status: Fetal Heart Rate (bpm): 148   Movement: Absent     General:  Alert, oriented and cooperative. Patient is in no acute distress.  Skin: Skin is warm and dry. No rash noted.   Cardiovascular: Normal heart rate noted  Respiratory: Normal respiratory effort, no problems with respiration noted  Abdomen: Soft, gravid, appropriate for gestational age.  Pain/Pressure: Present     Pelvic: Cervical exam deferred        Extremities: Normal range of motion.  Edema: None  Mental Status: Normal mood and affect. Normal behavior. Normal judgment and thought content.   Assessment and Plan:  Pregnancy: [id number] at [redacted]w[redacted]d 1. Supervision of high risk pregnancy, antepartum -will redraw TSH labs today; explained that her thyroid may be out of order and that could cause some of her symptoms. Also encouraged her to  drink liquids and take her nausea medicine.  -patient to bring all of her medicines with her next visit to discuss with provider -patient to go to drugstore and get prescriptions filled ; explained that her RX are waiting for her and she should fill them and take them -discussed death of baby 5 years ago; support and reassurance given, explained that we will be doing [place] and testing at 24 weeks  Preterm labor symptoms and general obstetric precautions including but not limited to vaginal bleeding, contractions, leaking of fluid and fetal movement were reviewed in detail with the patient. Please refer to After Visit Summary for other counseling recommendations.   Return in about 1 week (around 04/11/2021), or RN to talk about medicines and go over what she is taking.  Future Appointments  Date Time Provider Department Center  04/11/2021  9:15 AM [facility] HEALTH CLINICIAN [facility]  04/14/2021  9:55 AM Allayne Stack, DO [facility]  04/27/2021  8:45 AM [facility] NURSE [facility]  04/27/2021  9:00 AM [facility] US1 [facility]    Marylene Land, CNM

## 2021-04-04 NOTE — Patient Instructions (Signed)
AREA PEDIATRIC/FAMILY PRACTICE PHYSICIANS  Central/Southeast [place] ([address]) [facility] Chambliss, MD; Eniola, MD; Hale, MD; Hensel, MD; McDiarmid, MD; McIntyer, MD; Neal, MD; Walden, MD [address] [place], [place] [address] [phone] Mon-Fri 8:30-12:30, 1:30-5:00 Providers come to see babies at [facility] Accepting Medicaid [facility] providers who accept newborns: Koirala, MD; Morrow, MD; Wolters, MD [address], [place], [place] [address] [phone] Mon-Fri 8:00-5:30 Babies seen by providers at [facility] Does NOT accept Medicaid Please call early in hospitalization for appointment (limited availability)  [facility] Mulberry, MD [address] [place], [place] [address] [phone] Mon, Tue, Thur, Fri 8:30-5:00, Wed 10:00-7:00 (closed 1-2pm) Babies seen by [facility] providers Accepting Medicaid Rubin - Pediatrician Rubin, MD [address], [place], [place] [address] [phone] Mon-Fri 8:30-5:00, Sat 8:30-12:00 Provider comes to see babies at [facility] Accepting Medicaid Must have been referred from current patients or contacted office prior to delivery [facility] ([facility]) Brown, MD; Chandler, MD; Ettefagh, MD; Grant, MD; Lester, MD; McCormick, MD; McQueen, MD; Prose, MD; Simha, MD; Stanley, MD; Stryffeler, NP; Tebben, NP [address], [place], [place] [address] [phone] Mon, Tue, Thur, Fri 8:30-5:30, Wed 9:30-5:30, Sat 8:30-12:30 Babies seen by [facility] providers Accepting Medicaid Only accepting infants of first-time parents or siblings of current patients Hospital discharge coordinator will make follow-up appointment Jack Amos [address], [place], [place]  [address] [phone]   Fax - [phone] [facility] [address], [place], [place]  [address] Phone - [phone]   Fax - [phone] Shilpa Gosrani [address], [place], [place]  [address] [phone]  East/Northeast [place] ([address]) [facility] Bates, MD; Brassfield, MD; Cooper, Cox, MD; MD; Davis, MD; Dovico, MD; Ettefaugh, MD; Little, MD; Lowe, MD; Keiffer, MD; Melvin, MD; Sumner, MD; Williams, MD [address], [place], [place] [address] [phone] Mon-Fri 8:30-5:00 (extended evenings Mon-Thur as needed), Sat-Sun 10:00-1:00 Providers come to see babies at [facility] Accepting Medicaid for families of first-time babies and families with all children in the household age 3 and under. Must register with office prior to making appointment (M-F only). [facility] Henson, NP; Knapp, MD; Lalonde, MD; Tysinger, PA [address] [place], [place] [address] [phone] Mon-Fri 8:00-5:00 Babies seen by providers at [facility] Does NOT accept Medicaid/Commercial Insurance Only [facility] ([facility])  Artis, MD; Barnes, MD; Bratton, MD; Coccaro, MD; Lockett Gardner, MD; Kramer, MD; Marshall, MD; Netherton, MD; Poleto, MD; Skinner, MD [address] [place], [place] [address] [phone] Mon-Fri 8:30-5:30, Sat (Oct.-Mar.) 9:00-1:00 Babies seen by providers at [facility] Accepting Medicaid  [place] ([address]) [facility] Reid, MD; Warner, MD [address], [place], [place] [address] [phone] Mon-Fri 8:30-5:00, Sat 8:30-12:00 Providers come to see babies at [facility] Does NOT accept Medicaid [facility] Becker, PA; Hagler, MD; Scifres, PA; Sun, MD; Swayne, MD [address], [place], [place] [address] [phone] Mon-Fri 8:00-5:00 Babies seen by providers at [facility] Does NOT accept Medicaid Only accepting babies of parents who  are patients Please call early in hospitalization for appointment (limited availability) [facility] Clark, MD; Frye, MD; Kelleher, MD; Mack, NP; Miller, MD; O'Keller, MD; Patterson, NP; Pudlo, MD; Puzio, MD; Thomas, MD; Tucker, MD; Twiselton, MD [address], [place], [place] [address] [phone] Mon-Fri 8:00-5:00, Sat 9:00-12:00 Providers come to see babies at [facility] Does NOT accept Medicaid  [place] ([address]) [facility] providers accepting new patients: Brake, NP; Wharton, PA [address], [place], [place] [address] [phone] Mon-Fri 8:00-5:00 Babies seen by providers at [facility] Does NOT accept Medicaid Only accepting babies of parents who are patients Please call early in hospitalization for appointment (limited availability) [facility] Gay, MD; Quinlan, MD [address] [place], [place] [address] [phone] (press 1 to schedule appointment) Mon-Fri 8:00-5:00 Providers come to see babies at [facility] Does NOT accept Medicaid [facility] Mazer, MD [address] [place], [place] [address] [phone] Mon-Fri 8:30-5:00 (lunch 12:30-1:00), extended hours by appointment only Wed 5:00-6:30 Babies seen by [facility] providers Accepting Medicaid [facility] Banks, MD; Jordan, MD; Koberlein, MD [address], [place], [place] [address] [phone] Mon-Fri 8:00-5:00 Babies seen by [facility] providers Does NOT accept Medicaid [facility] Parker, MD; Hunter, MD; Wallace, DO [address] [place], [place] [address] [phone] Mon-Fri 8:00-5:00 Babies seen by [facility] providers Does NOT accept Medicaid [facility] Brandon, PA; Brecken, PA; Christy, NP; Dees, MD; DeClaire, MD; DeWeese, MD; Hansen, NP; Mills, NP; Parrish, NP; Smoot, NP; Summer, MD; Vapne,  MD [address] [place], [place] [address] [phone] Mon-Fri 8:30-5:00, Sat 10:00-1:00 Providers come to see babies at [facility] Does NOT accept Medicaid Free prenatal information session Tuesdays at 4:45pm [facility] Bouska, MD; Gordon, PA; Jeffery, PA; Weber, PA [address] [place] [address] [phone] Mon-Fri 7:30-5:30 Babies seen by [facility] providers [facility] [address], [place], [place]  [address] [phone]   Fax - [phone]  [place] ([address] & [address]) [facility] [place] [address] [place], [place] [address] [phone] Mon-Thur 8:00-6:00 Providers come to see babies at [facility] Accepting Medicaid [facility] Anderson, NP; Badger, MD; Beal, PA; Spencer, PA [address] [place], [place] [address] [phone] Mon-Thur 7:30-7:30, Fri 7:30-4:30 Babies seen by [facility] providers Accepting Medicaid [facility] Agbuya, MD; Klett, NP; Romgoolam, MD [address], [place], [place] [address] [phone] Mon-Fri 8:30-5:00, Sat 8:30-12:00 Providers come to see babies at [facility] Accepting Medicaid Must have "Meet & Greet" appointment at office prior to delivery [facility] ([facility]) McCord, MD; Wallace, MD; Wood, MD [address], [place], [place] [address] [phone] Mon-Wed 8:00-6:00, Thur-Fri 8:00-5:00, Sat 9:00-12:00 Providers come to see babies at [facility] Does NOT accept Medicaid Only accepting siblings of current patients [facility]  [address], [place], [place]  [address] [phone]   Fax - [phone] [facility] [address], [place], [place]  [address] [phone]   Fax -  [phone]  [place]west [place] ([address] & [address]) [facility] Cirigliano, DO; Matthews, DO [address] [place], [place] [address] [phone] Mon-Fri 7:00-5:00 Babies seen by [facility] providers Does NOT accept Medicaid [facility] Briscoe, MD; Howley, PA; Moreira, PA [address], [place],  [address] [phone] Mon-Fri 8:00-5:00 Babies seen by [facility] providers Accepting Medicaid [facility] Boyd, MD; Church, PA; Jones, NP; Osborn, PA [address], ,  [address] [phone] Mon-Fri 8:00-5:00 Babies seen by providers at [facility] Accepting Medicaid  [place] ([address]) [facility] [place] Wendling, DO [address] [place], [place] [address] [phone] Mon-Fri 8:00-5:00 Babies seen by [facility] providers Does NOT accept Medicaid Limited availability, please call early in hospitalization to schedule follow-up [facility] Calderon, PA; Cummings, MD; Dillard, MD; Martin, PA; Olson, MD; VanDeven, PA [address], [place], [place] [address] [phone] Mon-Fri 8:30-5:00, Sat 9:00-12:00 Babies seen by providers at [facility] Accepting Medicaid Please register online then schedule online or call office [email or web address]) Hunter, NP; Kumar, MD; Martin Rogers, PA [address], [place], [place] [address] [phone] Mon-Fri 8:00-5:00 Babies seen by providers at [facility] Accepting Medicaid [facility]) Glasgow, MD; Kristi Fleenor, NP; West, MD [address], [place], [place] [address] [phone] Mon-Fri 8:00-5:30, Sat&Sun by appointment (phones open at  8:30) Babies seen by [facility] providers Accepting Medicaid Must be a first-time baby or sibling of current patient [facility] [place]  [address], [place], [place]  [address] [phone]   Fax - [phone]  [place] ([address]) [facility] [place] [facility] Brown, PA; Cowen, PA; Rice, MD; Helton, PA; Spry, MD [address] [place], [place] [address] [phone] Mon-Thur 8:00-7:00, Fri 8:00-5:00, Sat 8:00-12:00, Sun 9:00-12:00 Babies seen by [facility] providers Accepting Medicaid [facility] [place] Coe-Goins, MD; Marshall, MD; Pierre-Louis, MD [address], [place], [place] [address] [phone] Mon-Thur 8:00-5:00 Babies seen by providers at [facility] Accepting Medicaid [facility] [place] Brat[place], MD; Coe-Goins, MD; Hayes, MD; Lewis, MD; List, MD; Lott, MD; Marshall, MD; Moran, MD; O'Neal, MD; Pierre-Louis, MD; Pitonzo, MD; Scholer, MD; Spangle, MD [address] [place], [place] [address] [phone] Mon-Fri 8:00-5:30, Sat (Oct.-Mar.) 9:00-1:00 Babies seen by providers at [facility] Accepting Medicaid Must fill out new patient packet, available online at [email or web address] [facility] ([facility]) Friddle, NP; Harris, NP; Kelly, NP; Logan, MD; Melvin, PA; Poth, MD; Ramadoss, MD; Stanton, NP [address], [place], [place] [address] [phone] Mon-Thur 8:00-5:30, Fri 8:00-5:00 Babies seen by providers at [facility] Accepting Medicaid  [place] ([address]) [facility] Dixon, PA; Kalaheo, MD; Pickard, MD; Tapia, PA [address], [place], [place] [address] [phone] Mon-Fri 8:00-5:00 Babies seen by providers at [facility] Accepting Medicaid   [place] ([address]) [facility] [place] Masneri, DO; Meyers, MD; Nelson, PA [address], [place], [place] [address] [phone] Mon-Fri 8:00-5:00 Babies seen by providers at [facility] Does NOT accept Medicaid Limited appointment availability, please call early in hospitalization   [facility] [place] Kunedd, DO; McGowen, MD [address], [place], [place] [address] [phone] Mon-Fri 8:00-5:00 Babies seen by [facility] providers Does NOT accept Medicaid [facility] [place] Cameron, MD; MacDonald, MD; Michaels, PA; Nayak, MD [address] [place] [address], [place], [place] [address] [phone] Mon-Fri 8:00-5:00 After hours clinic ([address] [place], [place] [address]) [phone] Mon-Fri 5:00-8:00, Sat 12:00-6:00, Sun 10:00-4:00 Babies seen by [facility] providers Accepting Medicaid [facility] [place] [address] [place], [place]  [address] [phone]   Fax - [phone]  [place] [phone]) Adult nurse [facility], MD [address] [place], [place], [place] [address] [phone] Mon-Fri 8:00-5:00 Babies seen by [facility] providers Does NOT accept Medicaid [facility] [place] [facility] [place]) Rene Kocher, MD [address], [place], [place] [address] [phone] Mon-Thur 8:00-7:00, Fri 8:00-5:00, Sat 8:00-12:00 Babies seen by providers at [facility] Accepting Medicaid - but does not have vaccinations in office (must be received elsewhere) Limited availability, please call early in hospitalization  [facility] [phone]) [facility]  Wyvonne Lenz, MD [address], [place] [address] [phone]  Fax [phone]  [facility]  Lyndel Safe, MD, [place], [place], [place], [place] [address] [place], [place]  [address] [phone] [facility]  [address] Sherian Maroon [place], [place] [address] [phone] [address], [place], [place] [address] [phone] [facility] Office)  [facility] [address] [place], [place] [address] [phone] [facility] [address], [place], [place] [address] [phone] [facility] [address], [place], [place] [address] [phone] [facility] [address], [place], [place] [address] [phone] [facility] [address], [place], [place] [address] [phone] [facility] [address], [place], [place] [address] [phone] [facility] Pediatrics  [address], [place], [place] [address] [phone] Dr. Belia Heman. Little [address], [place], [place] [address] [phone] [facility] [address], [place], [place] [address] [phone] [facility] [place] [address] [place], [place] [address] [phone]        AREA FAMILY PRACTICE PHYSICIANS  Central/Southeast [place] [phone]) [facility] [address] [place], [place] [address] [phone] Mon-Fri 8:30-12:30, 1:30-5:00 Accepting [facility] [address], [place], [place] [address] [phone] Mon-Fri 8:00-5:30 [facility] [address], [place], [place] [address] [phone] Farris Has, Thur, Fri 8:30-5:00, Wed 10:00-7:00 (closed 1-2pm) Accepting Medicaid [facility] [address], [place], [place]  [address] Phone - [phone]   Fax - [phone]  East/[place]ortheast [place] [phone]) [facility] [address] [place], [place] [address] [phone] Mon-Fri 8:00-5:00 [facility])  [address] Sherian Mar[place], [place]  [address] [phone] Mon-Fri 8:30-5:30, Sat (Oct.-Mar.) 9:00-1:00 Accepting Medicaid  [place] [phone]) [facility] [address] [place], [place] [address] [phone] Mon-Fri 8:00-5:00  [place] [phone]) [facility] [address], [place], [place] [address] [phone] Mon-Fri 8:00-5:00 [facility] [address] [place], [place], [place] [address] [phone] Mon-Fri 8:00-5:00 [facility] [address] [place], [place] [address] [phone] Mon-Fri 8:00-5:00 [facility] [address] [place] [address] [phone] Mon-Fri 7:30-5:30  [place] [phone] & [phone]) [facility] [address] [place], [place] [address] [phone] Mon-Thur 8:00-6:00 Accepting [facility] [address] [place], [place] [address] [phone] Mon-Thur 7:30-7:30, Fri 7:30-4:30 Accepting [facility] [address], [place], [place]  [address] [phone]   Fax - [phone]  [place] [phone] & [phone]) Adult nurse [facility] [address] [place], [place] [address] [phone] Mon-Fri 7:00-5:00 [facility] [address], [place],  [place] [address] [phone] Mon-Fri 8:00-5:00 Accepting Medicaid [facility] [address] [place], [place], [place] [address] [phone] Mon-Fri 8:00-5:00 Accepting Medicaid  [facility] [place] [phone]) [facility] [address] [place], [place] [address] [phone] Mon-Fri 8:00-5:00 [facility] at [facility]) [address], [place], [place]  [address] [phone] Mon-Fri 8:00-5:00 Accepting Medicaid [facility] [place] ([facility]) [address], [place], [place] [address] [phone] Mon-Fri 8:00-5:30, Sat&Sun by appointment (phones open at 8:30) Accepting [facility] [phone] & [phone]) [facility] [address] [place], [place] [address] [phone] Mon-Thur 8:00-7:00, Fri 8:00-5:00, Sat 8:00-12:00, Sun 9:00-12:00 Accepting Medicaid [facility] at [facility] [address] Matt[address] [place], [place] [address] [phone] Mon-Thur 8:00-5:00 Accepting Medicaid [facility] [place] [address] Sherian Maroon [place], [place] [address] [phone] Mon-Fri 8:00-5:30, Sat (Oct.-Mar.) 9:00-1:00 Accepting [facility]  [place] [phone]) [facility] [address] Edis [place], [place] [address] [phone] Mon-Fri 8:00-5:00 Accepting [facility]   [place] [phone]) [place] [facility] [address], [place], [place] [address] [phone] Mon-Fri 8:00-5:00 [facility] [address] Clint Lipps [place], [place] [address] [phone] Mon-Fri 8:00-5:00 [facility] [place] [address], [place], [place] [address] [phone] Mon-Fri 8:00-5:00 After hours clinic [facility] [place], [place] [address]) [phone] Mon-Fri 5:00-8:00, Sat 12:00-6:00, Sun 10:00-4:00 Accepting Medicaid [facility]. [address] [place], [place]  [address] [phone]   Fax - [phone]  [place] [phone]) Adult nurse HealthCare at [facility] [address] [place], [place], [place] [address] [phone] Mon-Fri 8:00-5:00 [facility] [place] [facility] [place]) [address] [place] [address] [place], [place] [address] [phone] Mon-Thur  8:00-7:00, Fri 8:00-5:00, Sat 8:00-12:00

## 2021-04-04 NOTE — Progress Notes (Signed)
Pt states is having stomach & back pain, also is feeling dizziness & weak.Very stressed ,having anxiety.Pt has appt with Asher Muir on 04/11/21.

## 2021-04-05 LAB — TSH: TSH: 3.37 u[IU]/mL (ref 0.450–4.500)

## 2021-04-05 LAB — T4, FREE: Free T4: 0.92 ng/dL (ref 0.82–1.77)

## 2021-04-05 LAB — T3: T3, Total: 285 ng/dL — ABNORMAL HIGH (ref 71–180)

## 2021-04-11 ENCOUNTER — Other Ambulatory Visit: Payer: Self-pay

## 2021-04-11 ENCOUNTER — Other Ambulatory Visit: Payer: Self-pay | Admitting: Family Medicine

## 2021-04-11 ENCOUNTER — Ambulatory Visit (INDEPENDENT_AMBULATORY_CARE_PROVIDER_SITE_OTHER): Payer: Medicaid Other | Admitting: Clinical

## 2021-04-11 DIAGNOSIS — F4323 Adjustment disorder with mixed anxiety and depressed mood: Secondary | ICD-10-CM | POA: Diagnosis not present

## 2021-04-11 DIAGNOSIS — O0990 Supervision of high risk pregnancy, unspecified, unspecified trimester: Secondary | ICD-10-CM

## 2021-04-11 DIAGNOSIS — Z658 Other specified problems related to psychosocial circumstances: Secondary | ICD-10-CM

## 2021-04-11 MED ORDER — PREPLUS 27-1 MG PO TABS: 1.0000 | ORAL_TABLET | Freq: Every day | ORAL | 10 refills | Status: DC

## 2021-04-11 MED ORDER — PRENATAL 27-0.8 MG PO TABS: 1.0000 | ORAL_TABLET | Freq: Every day | ORAL | 0 refills | Status: DC

## 2021-04-11 NOTE — BH Specialist Note (Signed)
Integrated Behavioral Health Follow Up In-Person Visit  MRN: [id number] Name: Jessica Robbins  Number of Integrated Behavioral Health Clinician visits: 3/6 Session Start time: 9:56  Session End time: 10:55 Total time:  59  minutes (part of time with clinical person in room for medical concerns)  Types of Service: Individual psychotherapy  Interpretor:Yes.   Interpretor Name and Language: Mahin, Language Resources, in-person, Dari Language  Subjective: Jessica Robbins is a 20 y.o. female accompanied by  in-person interpreter Patient was referred by Nolene Bernheim, NP for anxiety and depression. Patient reports the following symptoms/concerns: Pt states her primary concern today is sadness over father's recent cancer diagnosis, as well as increase in headaches, waking with "ringing in ears", lack of quality sleep(wakes up suddenly after 1-2 hours) numbness in hands, pain on sides of stomach. Pt requesting only female providers, at ob/gyn office and at hospital when she has baby.  Duration of problem: Increase in pregnancy; Severity of problem: moderate  Objective: Mood: Anxious and Depressed and Affect: Tearful Risk of harm to self or others: No plan to harm self or others  Life Context: Family and Social: Pt lives with husband and two sons;  (1yo; 3yo) School/Work: - Self-Care: - Life Changes: Current pregnancy; move from [place] to [place]; father's recent cancer diagnosis  Patient and/or Family's Strengths/Protective Factors: Social connections, Concrete supports in place (healthy food, safe environments, etc.), and Sense of purpose  Goals Addressed: Patient will:  Reduce symptoms of: anxiety, depression, insomnia, and stress   Increase knowledge and/or ability of: stress reduction   Demonstrate ability to: Increase healthy adjustment to current life circumstances and Increase motivation to adhere to plan of care  Progress towards Goals: Ongoing  Interventions: Interventions  utilized:  Sleep Hygiene, Psychoeducation and/or Health Education, and Supportive Reflection Standardized Assessments completed: GAD-7 and PHQ 9  Patient and/or Family Response: Pt agrees with treatment plan  Patient Centered Plan: Patient is on the following Treatment Plan(s): IBH Assessment: Patient currently experiencing Adjustment disorder with mixed anxious and depressed mood and Psychosocial stress.   Patient may benefit from continued psychoeducation and brief therapeutic interventions regarding coping with symptoms of anxiety, depression, life stress .  Plan: Follow up with behavioral health clinician on : Two weeks Behavioral recommendations:  -Follow medical provider advice today (obtain wrist splints; begin taking Flexeril as prescribed) -Continue taking prenatal vitamin daily -Notice if Flexeril helps improve sleep the next two weeks -Use breathing exercise as needed throughout the day -Consider prioritizing self-care in next two weeks (healthy sleep, healthy meals, stay well-hydrated) Referral(s): [facility] [employer] [facility] (In Clinic)   Valetta Close [place], [place]  Depression screen [facility] 2/9 03/06/2021 02/27/2021 12/14/2020  Decreased Interest 1 3 1   Down, Depressed, Hopeless 1 3 1   PHQ - 2 Score 2 6 2   Altered sleeping 1 3 2   Tired, decreased energy 1 3 2   Change in appetite 0 3 2  Feeling bad or failure about yourself  0 0 2  Trouble concentrating 3 1 2   Moving slowly or fidgety/restless 0 0 0  Suicidal thoughts 0 0 0  PHQ-9 Score 7 16 12    GAD 7 : Generalized Anxiety Score 03/06/2021 02/27/2021 12/14/2020  Nervous, Anxious, on Edge 2 3 2   Control/stop worrying 2 3 2   Worry too much - different things 2 3 2   Trouble relaxing 2 0 2  Restless 2 0 2  Easily annoyed or irritable 2 1 2   Afraid - awful might happen 2 3 2   Total  GAD 7 Score 14 13 14

## 2021-04-14 ENCOUNTER — Other Ambulatory Visit: Payer: Self-pay

## 2021-04-14 ENCOUNTER — Ambulatory Visit (INDEPENDENT_AMBULATORY_CARE_PROVIDER_SITE_OTHER): Payer: Medicaid Other | Admitting: Family Medicine

## 2021-04-14 ENCOUNTER — Encounter: Payer: Self-pay | Admitting: Family Medicine

## 2021-04-14 VITALS — BP 110/61 | HR 81 | Wt 155.9 lb

## 2021-04-14 DIAGNOSIS — Z789 Other specified health status: Secondary | ICD-10-CM

## 2021-04-14 DIAGNOSIS — E038 Other specified hypothyroidism: Secondary | ICD-10-CM

## 2021-04-14 DIAGNOSIS — E063 Autoimmune thyroiditis: Secondary | ICD-10-CM

## 2021-04-14 DIAGNOSIS — Z5941 Food insecurity: Secondary | ICD-10-CM

## 2021-04-14 DIAGNOSIS — E039 Hypothyroidism, unspecified: Secondary | ICD-10-CM | POA: Insufficient documentation

## 2021-04-14 DIAGNOSIS — O0990 Supervision of high risk pregnancy, unspecified, unspecified trimester: Secondary | ICD-10-CM

## 2021-04-14 DIAGNOSIS — Z8759 Personal history of other complications of pregnancy, childbirth and the puerperium: Secondary | ICD-10-CM

## 2021-04-14 DIAGNOSIS — Z3A17 17 weeks gestation of pregnancy: Secondary | ICD-10-CM

## 2021-04-14 DIAGNOSIS — Z603 Acculturation difficulty: Secondary | ICD-10-CM

## 2021-04-14 NOTE — Progress Notes (Signed)
    Subjective:  Jessica Robbins is a 20 y.o. [id number] at [redacted]w[redacted]d being seen today for ongoing prenatal care.  She is currently monitored for the following issues for this high-risk pregnancy and has Supervision of high risk pregnancy, antepartum; History of IUFD; Language barrier; and Hypothyroidism on their problem list.  1 week follow up to check in on medications. She bought in her synthroid and prenatal vitamins. Taking prenatal everyday. Synthroid was from 12/2020 for #30 and had 29 tablets remaining. States she takes one more medicine for nausea but not sure what it is. Not taking zyrtec or hydroxyzine for her generalized pruritis-but states she isn't have itching recently. Did not divulge her mood concerns any further today (following with behavioral specialist).   Patient reports backache and nausea. Lower lumbar back pain without change in bowel/bladder, weakness, or numbness. Come and goes (unclear on duration, at least since last visit). Tender with palpation, massage with oil makes it better. Worse with going up stairs. Contractions: Not present. Vag. Bleeding: None.  Movement: Absent. Denies leaking of fluid.   The following portions of the patient's history were reviewed and updated as appropriate: allergies, current medications, past family history, past medical history, past social history, past surgical history and problem list.   Objective:   Vitals:   04/14/21 1002 04/14/21 1041  BP: (!) 75/57 110/61  Pulse: 87 81  Weight: 155 lb 14.4 oz (70.7 kg)     Fetal Status: Fetal Heart Rate (bpm): 139   Movement: Absent     General:  Alert, oriented and cooperative. Patient is in no acute distress.  Skin: Skin is warm and dry. No rash noted.   Cardiovascular: Normal heart rate noted  Respiratory: Normal respiratory effort, no problems with respiration noted  Abdomen: Soft, gravid, appropriate for gestational age. Pain/Pressure: Present     Pelvic:  Cervical exam deferred         Extremities: Lumbar: tenderness to palpation to lumbar paraspinal musculature around L4-L5 without overlying erythema, swelling, or bruising. Normal ROM and 5/5 strength. Normal gait.  Edema: None  Mental Status: Normal mood and affect. Normal behavior. Normal judgment and thought content.    Assessment and Plan:  Pregnancy: [id number] at [redacted]w[redacted]d  1. Supervision of high risk pregnancy, antepartum Anatomy U/S scheduled for 8/11.  2. History of IUFD Will start growth U/S at 24 weeks, antenatal testing at 46.    3. Language barrier [name] used for duration of visit.   4. Hypothyroidism due to Hashimoto's thyroiditis Reviewed thyroid labs from 7/19 with TSH in appropriate range with previous elevated TPO antibodies. Off of synthroid since 12/2020 and asx. Will continue off for now and recheck in TSH in the next 4 or so weeks. Discussed with Dr. Vergie Living.   5. Food insecurity "Grab and go" bag given.  - AMBULATORY REFERRAL TO [facility]  6. Low back pain MSK in nature. Provided with lumbar exercises. Encouraging daily walking. May continue to massage with oil.   7. Low blood pressure on initial read Suspect incorrect reading, recheck shortly after appropriate. No concerning sx.   Preterm labor symptoms and general obstetric precautions including but not limited to vaginal bleeding, contractions, leaking of fluid and fetal movement were reviewed in detail with the patient. Please refer to After Visit Summary for other counseling recommendations.  Return in about 2 weeks (around 04/28/2021) for HROB.   Allayne Stack, DO

## 2021-04-14 NOTE — Progress Notes (Signed)
Patient complains of lower back pain 

## 2021-04-14 NOTE — Patient Instructions (Signed)
Do not take the synthroid right now, but we will check your thyroid every couple of weeks to make sure it is stable.   Try the exercises as needed everyday.

## 2021-04-25 ENCOUNTER — Ambulatory Visit: Payer: Medicaid Other

## 2021-04-25 ENCOUNTER — Other Ambulatory Visit: Payer: Self-pay

## 2021-04-25 ENCOUNTER — Ambulatory Visit (INDEPENDENT_AMBULATORY_CARE_PROVIDER_SITE_OTHER): Payer: Medicaid Other | Admitting: Clinical

## 2021-04-25 DIAGNOSIS — F4323 Adjustment disorder with mixed anxiety and depressed mood: Secondary | ICD-10-CM

## 2021-04-25 DIAGNOSIS — O0990 Supervision of high risk pregnancy, unspecified, unspecified trimester: Secondary | ICD-10-CM

## 2021-04-25 DIAGNOSIS — Z658 Other specified problems related to psychosocial circumstances: Secondary | ICD-10-CM

## 2021-04-25 MED ORDER — CYCLOBENZAPRINE HCL 10 MG PO TABS
10.0000 mg | ORAL_TABLET | Freq: Three times a day (TID) | ORAL | 0 refills | Status: DC | PRN
Start: 2021-04-25 — End: 2021-08-17

## 2021-04-25 NOTE — Progress Notes (Signed)
Patient here today for [facility] appt. Pt reports concerns to [facility], who gives reports to Camelia Eng, NP and Fleet Contras, Charity fundraiser. Terri NP recommends Flexeril for headache and wrist splint for carpel tunnel like symptoms. RN to pt's room to talk with patient. Concerns addressed listed below:   Headache: patient reports ongoing headaches to [facility]. Does not improve with Tylenol. Pt endorses eating and drinking regularly. Terri, NP gives verbal order for Flexeril.  Round ligament pain: patient reporting pain to both sides of abdomen. Explained this is likely round ligament pain. Severity of this can worsen with each pregnancy. Encouraged pt to take warm baths and take Tylenol as needed. Explained this may not relieve symptoms as sometimes this will only resolve with time. Instructed pt to present to MAU for any severe pain. Shortness of breath and increased heart rate with exertion: pt reports shortness of breath only with vigorous activity. Explained that as pregnancy progresses lung space decreases due to size of uterus. Encouraged pt that she should contact the office with any shortness of breath or heart racing at rest. Numbness is hands when waking up: reviewed possibility of carpel tunnel. Recommended patient purchase wrist splint to wear overnight. Picture of correct splint provided in AVS.   Fleet Contras RN 04/25/21

## 2021-04-25 NOTE — Patient Instructions (Signed)
Purchase 2 wrist splints to wear at night while sleeping.
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
## 2021-04-27 ENCOUNTER — Other Ambulatory Visit: Payer: Medicaid Other

## 2021-04-27 ENCOUNTER — Ambulatory Visit: Payer: Medicaid Other

## 2021-05-01 ENCOUNTER — Other Ambulatory Visit: Payer: Self-pay | Admitting: Obstetrics

## 2021-05-01 ENCOUNTER — Other Ambulatory Visit: Payer: Self-pay

## 2021-05-01 ENCOUNTER — Encounter: Payer: Self-pay | Admitting: *Deleted

## 2021-05-01 ENCOUNTER — Ambulatory Visit: Payer: Medicaid Other | Admitting: *Deleted

## 2021-05-01 ENCOUNTER — Ambulatory Visit: Payer: Medicaid Other | Attending: Nurse Practitioner

## 2021-05-01 VITALS — BP 105/55 | HR 73

## 2021-05-01 DIAGNOSIS — Z789 Other specified health status: Secondary | ICD-10-CM

## 2021-05-01 DIAGNOSIS — E039 Hypothyroidism, unspecified: Secondary | ICD-10-CM

## 2021-05-01 DIAGNOSIS — O0990 Supervision of high risk pregnancy, unspecified, unspecified trimester: Secondary | ICD-10-CM | POA: Diagnosis not present

## 2021-05-01 DIAGNOSIS — Z8759 Personal history of other complications of pregnancy, childbirth and the puerperium: Secondary | ICD-10-CM | POA: Diagnosis not present

## 2021-05-01 DIAGNOSIS — O09299 Supervision of pregnancy with other poor reproductive or obstetric history, unspecified trimester: Secondary | ICD-10-CM

## 2021-05-01 IMAGING — US US MFM OB DETAIL+14 WK
1 series · 12 of 28 positions shown · non-contrast
Comparison: none

[Series 1: us mfm ob detail+14 wk · 12 of 133 slices shown]
[im 5/133]
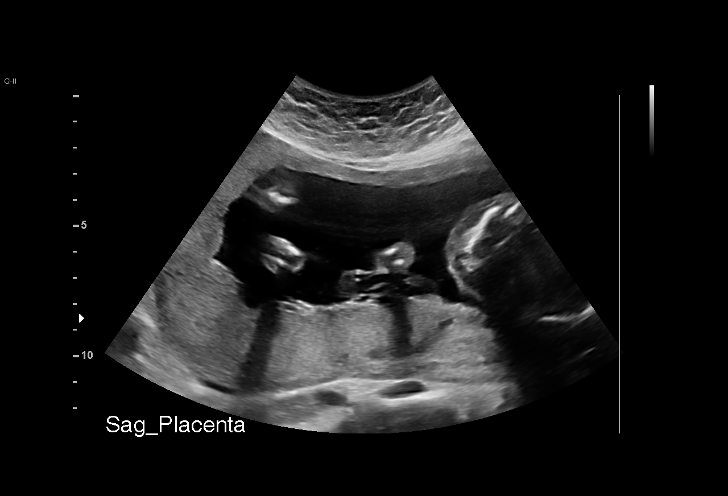
[im 15/133]
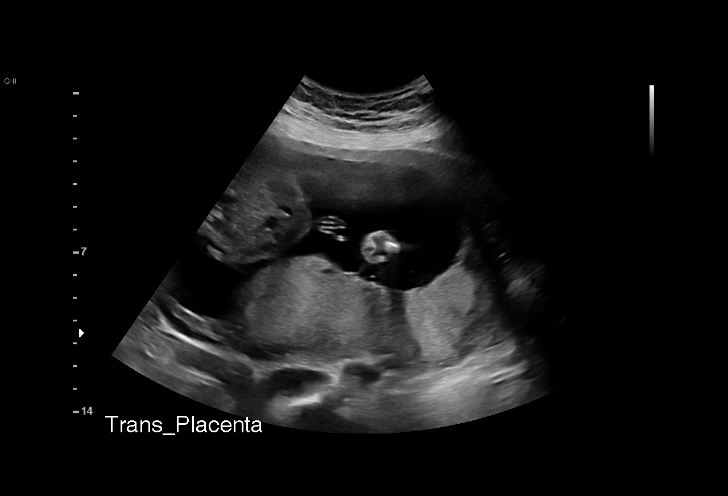
[im 25/133]
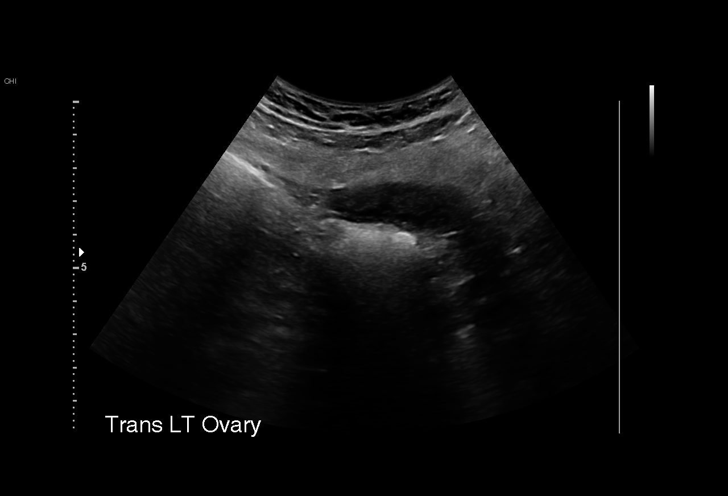
[im 40/133]
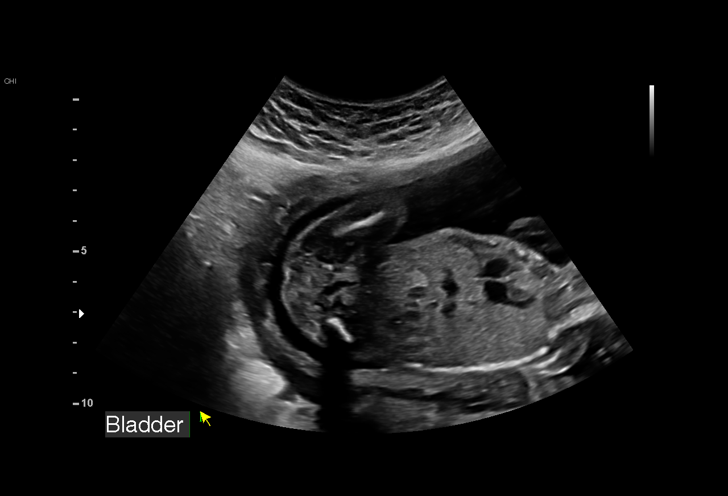
[im 49/133]
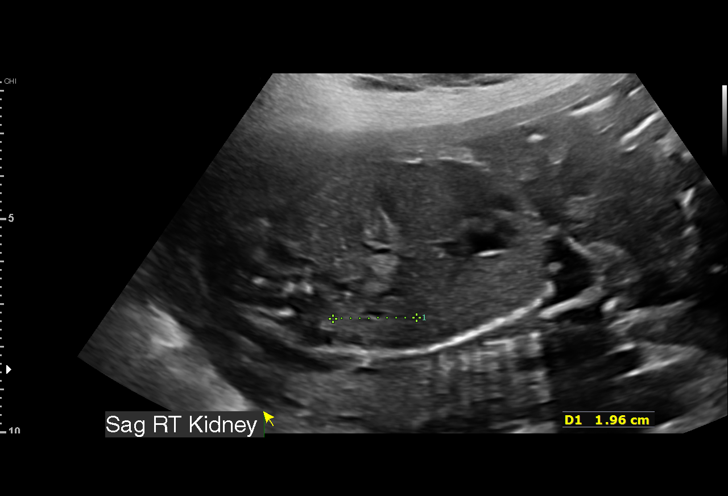
[im 59/133]
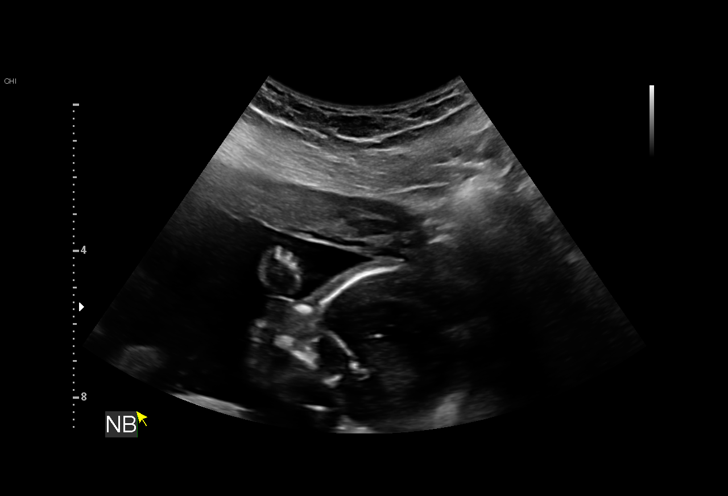
[im 74/133]
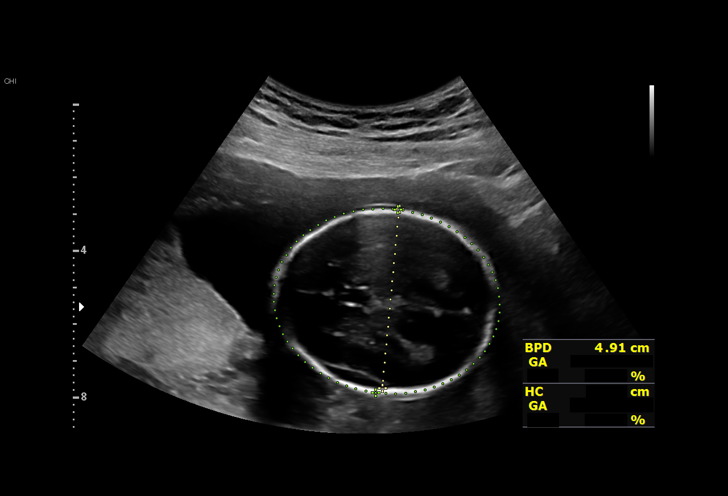
[im 84/133]
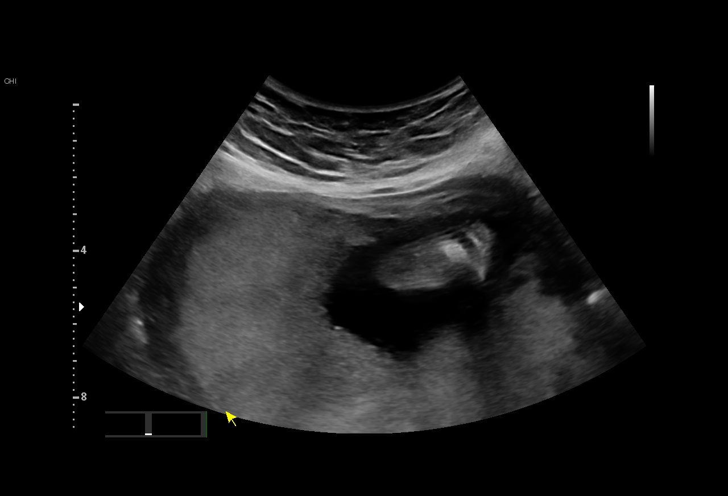
[im 93/133]
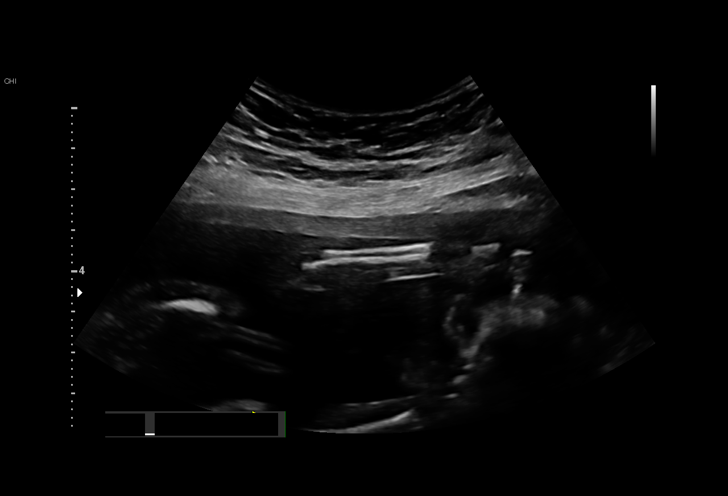
[im 108/133]
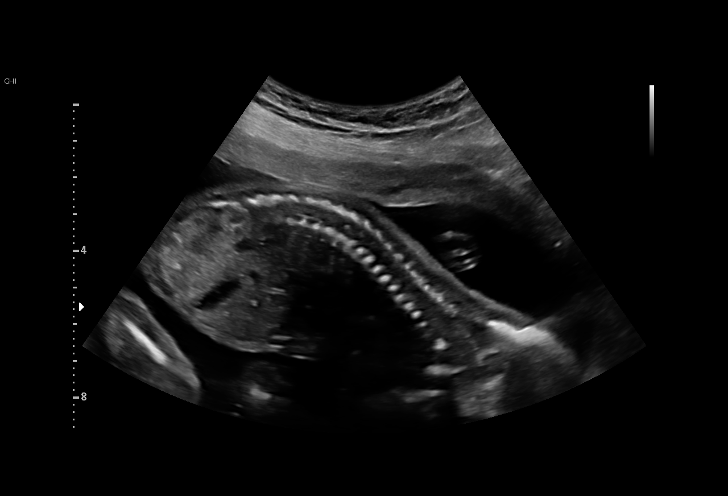
[im 118/133]
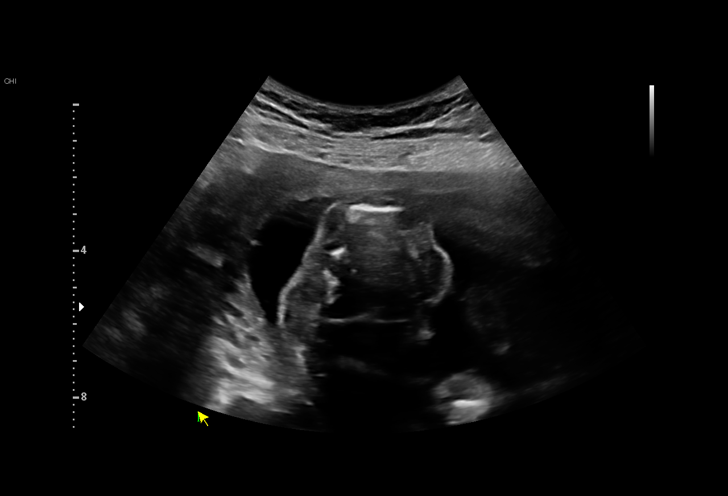
[im 128/133]
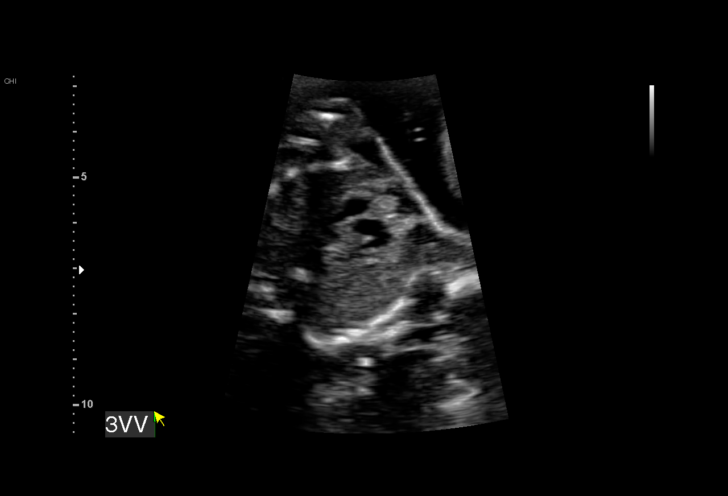

[12 of 28 positions shown; findings below may reference images not displayed]

TURQUI NP

Indications

 Poor obstetric history: Previous IUFD          [UE]
 (stillbirth)
 Thyroid disease in pregnancy (hypothyroid)     O99.280, [UE]
 Encounter for antenatal screening for          [UE]
 malformations
 19 weeks gestation of pregnancy
 No genetic testing.
Fetal Evaluation

 Num Of Fetuses:         1
 Fetal Heart Rate(bpm):  158
 Cardiac Activity:       Observed
 Presentation:           Cephalic
 Placenta:               Posterior
 P. Cord Insertion:      Visualized, central

 Amniotic Fluid
 AFI FV:      Within normal limits

                             Largest Pocket(cm)

Biometry

 BPD:        49  mm     G. Age:  20w 6d         85  %    CI:        75.74   %    70 - 86
                                                         FL/HC:      18.6   %    16.8 -
 HC:      178.5  mm     G. Age:  20w 2d         64  %    HC/AC:      1.12        1.09 -
 AC:      158.8  mm     G. Age:  21w 0d         80  %    FL/BPD:     67.8   %
 FL:       33.2  mm     G. Age:  20w 3d         62  %    FL/AC:      20.9   %    20 - 24
 HUM:      32.4  mm     G. Age:  20w 6d         80  %
 CER:      21.3  mm     G. Age:  20w 1d         81  %
 NFT:       3.7  mm

 LV:        8.7  mm
 CM:          6  mm

 Est. FW:     371  gm    0 lb 13 oz      88  %
OB History

 Gravidity:    4         Term:   2        Prem:   1
 Living:       2
Gestational Age

 LMP:           19w 6d        Date:  [DATE]                 EDD:   [DATE]
 U/S Today:     20w 5d                                        EDD:   [DATE]
 Best:          19w 6d     Det. By:  LMP  ([DATE])          EDD:   [DATE]
Anatomy

 Cranium:               Appears normal         LVOT:                   Appears normal
 Cavum:                 Appears normal         Aortic Arch:            Appears normal
 Ventricles:            Right lat vent ULN     Ductal Arch:            Appears normal
 Choroid Plexus:        Appears normal         Diaphragm:              Not well visualized
 Cerebellum:            Appears normal         Stomach:                Appears normal, left
                                                                       sided
 Posterior Fossa:       Appears normal         Abdomen:                Appears normal
 Nuchal Fold:           Appears normal         Abdominal Wall:         Appears nml (cord
                                                                       insert, abd wall)
 Face:                  Appears normal         Cord Vessels:           Appears normal (3
                        (orbits and profile)                           vessel cord)
 Lips:                  Appears normal         Kidneys:                Appear normal
 Palate:                Not well visualized    Bladder:                Appears normal
 Thoracic:              Appears normal         Spine:                  Appears normal
 Heart:                 Appears normal         Upper Extremities:      Appears normal
                        (4CH, axis, and
                        situs)
 RVOT:                  Appears normal         Lower Extremities:      Appears normal

 Other:  Fetus appears to be a male. Nasal bone and lenses visualized.
         Heels/feet and open hands visualized.  3VV visualized. Technically
         difficult due to fetal position.
Cervix Uterus Adnexa

 Cervix
 Length:           3.13  cm.
 Normal appearance by transabdominal scan.

 Uterus
 No abnormality visualized.

 Right Ovary
 Within normal limits.

 Left Ovary
 Within normal limits.
 Cul De Sac
 No free fluid seen.

 Adnexa
 No abnormality visualized.
Comments

 This patient was seen for a detailed fetal anatomy scan due
 to a history of hypothyroidism and prior history of an IUFD at
 around 8 months.  The patient reports that the IUFD occurred
 in [place].  The cause of the IUFD remains
 undetermined.  Her most recent thyroid function tests were
 within normal limits and she is not treated with any
 medications for hypothyroidism.
 She denies any other significant past medical history and
 denies any problems in her current pregnancy.
 She had a cell free DNA test earlier in her pregnancy which
 indicated a low risk for trisomy 21, 18, and 13. A female fetus
 is predicted. An MSAFP was  MoM.
 She was informed that the fetal growth and amniotic fluid
 level were appropriate for her gestational age.
 There were no obvious fetal anomalies noted on today's
 ultrasound exam.  However, today's exam was limited due to
 the fetal position.
 The views of the fetal anatomy were obtained by the
 sonographer only as I was not allowed to scan the patient
 myself due to her religious beliefs.
 The patient was informed that anomalies may be missed due
 to technical limitations. If the fetus is in a suboptimal position
 or maternal habitus is increased, visualization of the fetus in
 the maternal uterus may be impaired.
 Due to her history of hypothyroidism and IUFD, we will
 continue to follow her with growth ultrasounds throughout her
 pregnancy.  Weekly fetal testing should also be started at
 around 32 weeks.
 A follow-up exam was scheduled in 4 weeks to complete the
 views of the fetal anatomy and to assess the fetal growth.
 All conversations were held with the patient today with the
 help of an TURQUI interpreter.

## 2021-05-04 ENCOUNTER — Encounter: Payer: Medicaid Other | Admitting: Obstetrics and Gynecology

## 2021-05-08 ENCOUNTER — Other Ambulatory Visit: Payer: Self-pay | Admitting: Family Medicine

## 2021-05-08 DIAGNOSIS — O0990 Supervision of high risk pregnancy, unspecified, unspecified trimester: Secondary | ICD-10-CM

## 2021-05-09 ENCOUNTER — Ambulatory Visit: Payer: Medicaid Other | Admitting: Clinical

## 2021-05-09 ENCOUNTER — Other Ambulatory Visit: Payer: Self-pay

## 2021-05-09 ENCOUNTER — Ambulatory Visit (INDEPENDENT_AMBULATORY_CARE_PROVIDER_SITE_OTHER): Payer: Medicaid Other | Admitting: Certified Nurse Midwife

## 2021-05-09 VITALS — BP 114/64 | HR 87 | Wt 157.2 lb

## 2021-05-09 DIAGNOSIS — Z3A21 21 weeks gestation of pregnancy: Secondary | ICD-10-CM

## 2021-05-09 DIAGNOSIS — O0992 Supervision of high risk pregnancy, unspecified, second trimester: Secondary | ICD-10-CM

## 2021-05-09 DIAGNOSIS — L240 Irritant contact dermatitis due to detergents: Secondary | ICD-10-CM

## 2021-05-09 DIAGNOSIS — F4323 Adjustment disorder with mixed anxiety and depressed mood: Secondary | ICD-10-CM

## 2021-05-09 NOTE — Patient Instructions (Addendum)
Please use hydrocortisone cream on the itchy areas of your fingers and use gloves when washing dishes until the itching/irritation has gone away.  Next visit she will need to be fasting, nothing to eat or drink after midnight the morning of her appointment.

## 2021-05-10 NOTE — Progress Notes (Signed)
   PRENATAL VISIT NOTE  Subjective:  Jessica Robbins is a 20 y.o. [id number] at [redacted]w[redacted]d being seen today for ongoing prenatal care.  She is currently monitored for the following issues for this high-risk pregnancy and has Supervision of high risk pregnancy, antepartum; History of IUFD; Language barrier; and Hypothyroidism on their problem list.  Patient reports  no pregnancy complaints. Has a small cut (like a papercut) on her left index finger, noted that after she got the cut she was washing dishes and developed an itchy rash around the cut and on her fingertips.   Contractions: Not present. Vag. Bleeding: None.  Movement: Present. Denies leaking of fluid.   The following portions of the patient's history were reviewed and updated as appropriate: allergies, current medications, past family history, past medical history, past social history, past surgical history and problem list.   [name] Raynelle Fanning) present throughout visit for interpretation services. Objective:   Vitals:   05/09/21 1019  BP: 114/64  Pulse: 87  Weight: 157 lb 3.2 oz (71.3 kg)   Fetal Status: Fetal Heart Rate (bpm): 142   Movement: Present     General:  Alert, oriented and cooperative. Patient is in no acute distress.  Skin: Skin is warm and dry. No rash noted.   Cardiovascular: Normal heart rate noted  Respiratory: Normal respiratory effort, no problems with respiration noted  Abdomen: Soft, gravid, appropriate for gestational age.  Pain/Pressure: Absent     Pelvic: Cervical exam deferred        Extremities: Normal range of motion.  Edema: None  Mental Status: Normal mood and affect. Normal behavior. Normal judgment and thought content.   Assessment and Plan:  Pregnancy: [id number] at [redacted]w[redacted]d 1. Supervision of high risk pregnancy in second trimester - Doing well, feeling regular and vigorous fetal movement  2. [redacted] weeks gestation of pregnancy - Routine OB care - Has U/S scheduled for 9/13 - Next visit at 26wks,  anticipatory guidance given re GTT and need for fasting, also put in AVS  3. Irritant contact dermatitis due to detergent - Suggested use of hydrocortisone cream and dishwashing gloves until the area clears up  Preterm labor symptoms and general obstetric precautions including but not limited to vaginal bleeding, contractions, leaking of fluid and fetal movement were reviewed in detail with the patient. Please refer to After Visit Summary for other counseling recommendations.   Return in about 5 weeks (around 06/13/2021) for IN-PERSON, LOB/GTT.  Future Appointments  Date Time Provider Department Center  05/30/2021  2:45 PM [facility] NURSE [facility]  05/30/2021  3:00 PM [facility] US1 [facility]  06/21/2021  9:35 AM Anyanwu, Jethro Bastos, MD [facility]  06/21/2021 10:45 AM [facility] CLINICIAN [facility]    Bernerd Limbo, CNM

## 2021-05-30 ENCOUNTER — Ambulatory Visit: Payer: Medicaid Other | Admitting: *Deleted

## 2021-05-30 ENCOUNTER — Other Ambulatory Visit: Payer: Self-pay | Admitting: *Deleted

## 2021-05-30 ENCOUNTER — Ambulatory Visit: Payer: Medicaid Other | Attending: Obstetrics

## 2021-05-30 ENCOUNTER — Encounter: Payer: Self-pay | Admitting: *Deleted

## 2021-05-30 ENCOUNTER — Other Ambulatory Visit: Payer: Self-pay

## 2021-05-30 VITALS — BP 125/56 | HR 83

## 2021-05-30 DIAGNOSIS — Z362 Encounter for other antenatal screening follow-up: Secondary | ICD-10-CM | POA: Diagnosis not present

## 2021-05-30 DIAGNOSIS — Z789 Other specified health status: Secondary | ICD-10-CM

## 2021-05-30 DIAGNOSIS — E079 Disorder of thyroid, unspecified: Secondary | ICD-10-CM | POA: Diagnosis not present

## 2021-05-30 DIAGNOSIS — O0990 Supervision of high risk pregnancy, unspecified, unspecified trimester: Secondary | ICD-10-CM | POA: Diagnosis present

## 2021-05-30 DIAGNOSIS — Z3A24 24 weeks gestation of pregnancy: Secondary | ICD-10-CM

## 2021-05-30 DIAGNOSIS — E039 Hypothyroidism, unspecified: Secondary | ICD-10-CM | POA: Diagnosis not present

## 2021-05-30 DIAGNOSIS — Z8759 Personal history of other complications of pregnancy, childbirth and the puerperium: Secondary | ICD-10-CM | POA: Insufficient documentation

## 2021-05-30 DIAGNOSIS — O350XX0 Maternal care for (suspected) central nervous system malformation in fetus, not applicable or unspecified: Secondary | ICD-10-CM

## 2021-05-30 DIAGNOSIS — O09292 Supervision of pregnancy with other poor reproductive or obstetric history, second trimester: Secondary | ICD-10-CM | POA: Diagnosis not present

## 2021-05-30 DIAGNOSIS — O09299 Supervision of pregnancy with other poor reproductive or obstetric history, unspecified trimester: Secondary | ICD-10-CM

## 2021-05-30 DIAGNOSIS — O99282 Endocrine, nutritional and metabolic diseases complicating pregnancy, second trimester: Secondary | ICD-10-CM | POA: Diagnosis not present

## 2021-05-30 DIAGNOSIS — IMO0002 Reserved for concepts with insufficient information to code with codable children: Secondary | ICD-10-CM

## 2021-05-30 IMAGING — US US MFM OB FOLLOW-UP
1 series · 13 of 28 positions shown · non-contrast
Comparison: none

[Series 1: us mfm ob follow-up · 13 of 93 slices shown]
[im 4/93]
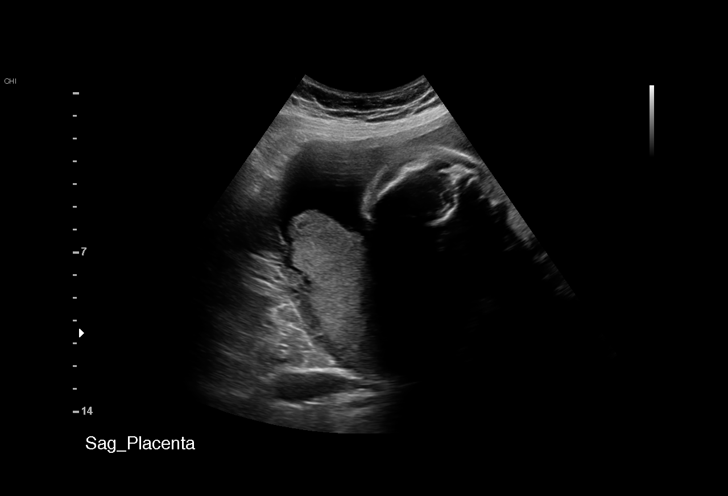
[im 11/93]
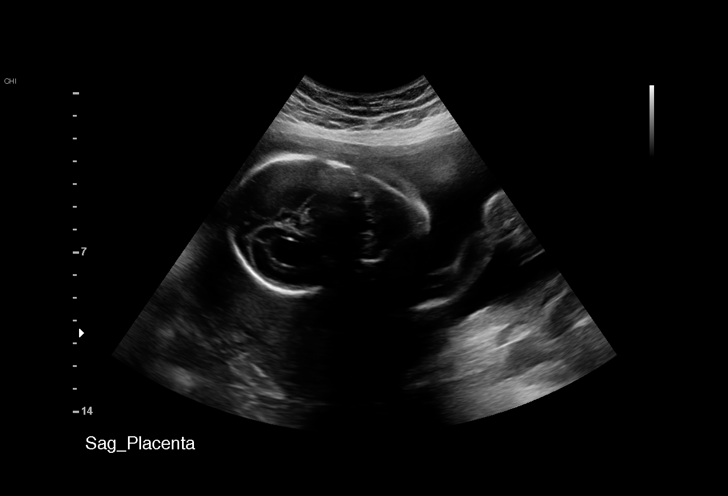
[im 18/93]
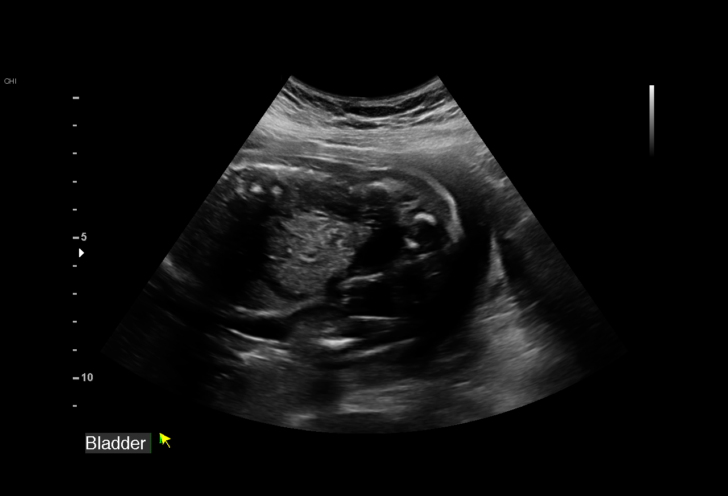
[im 24/93]
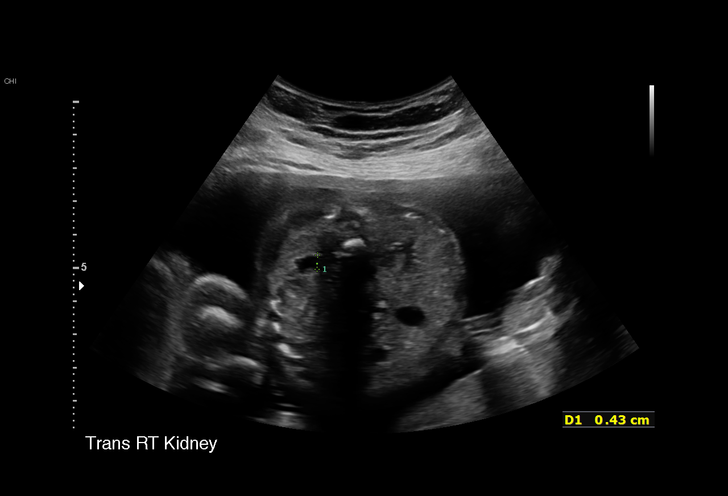
[im 31/93]
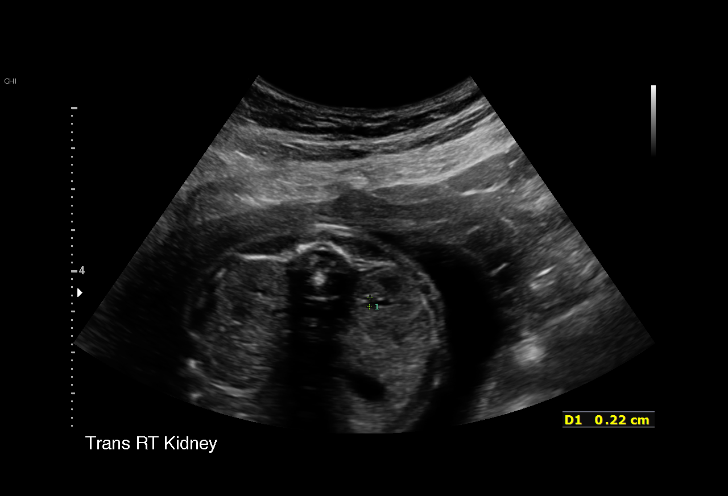
[im 38/93]
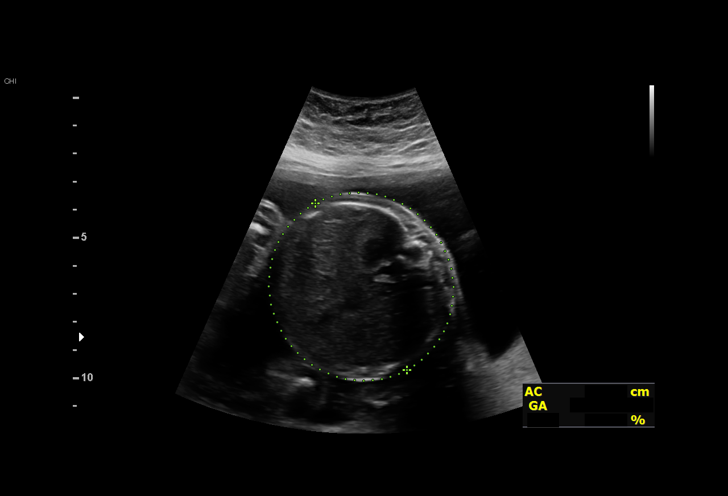
[im 48/93]
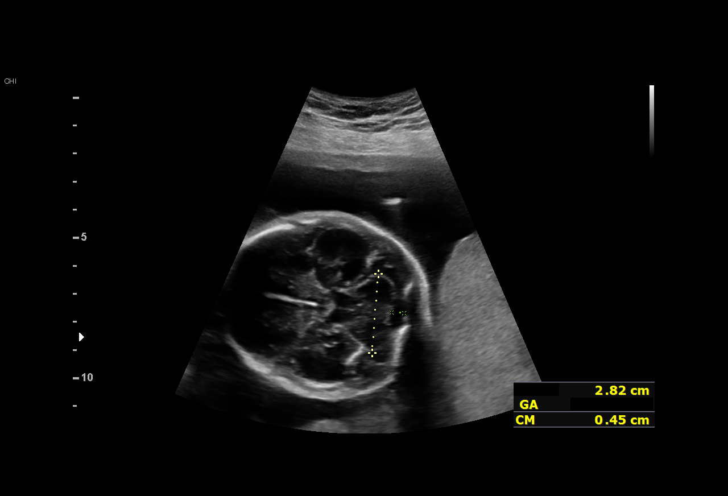
[im 55/93]
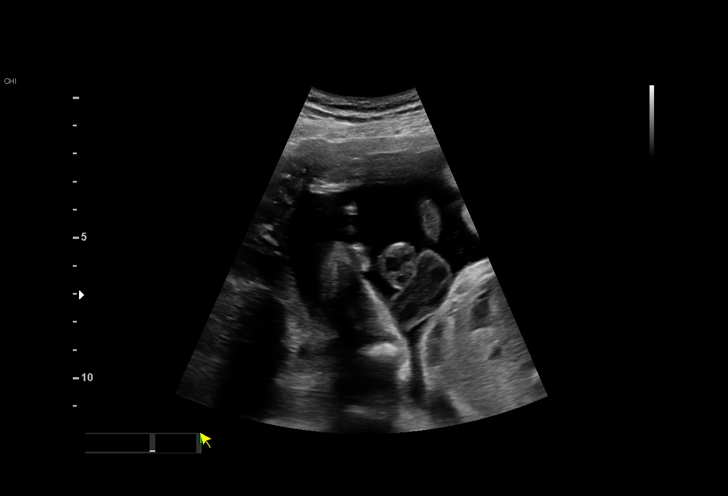
[im 62/93]
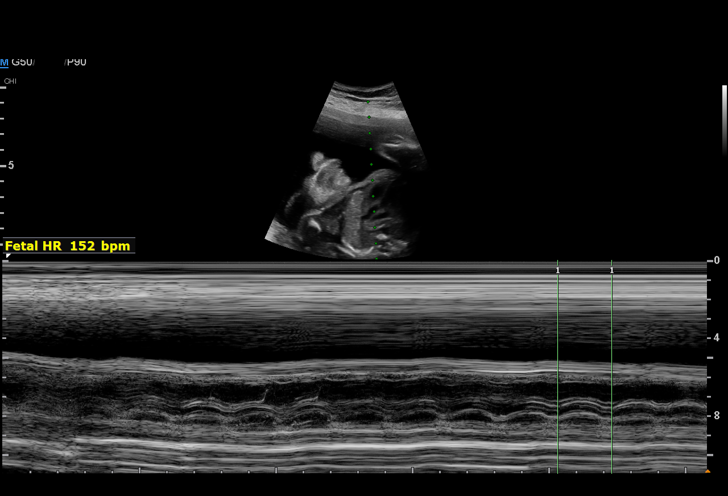
[im 69/93]
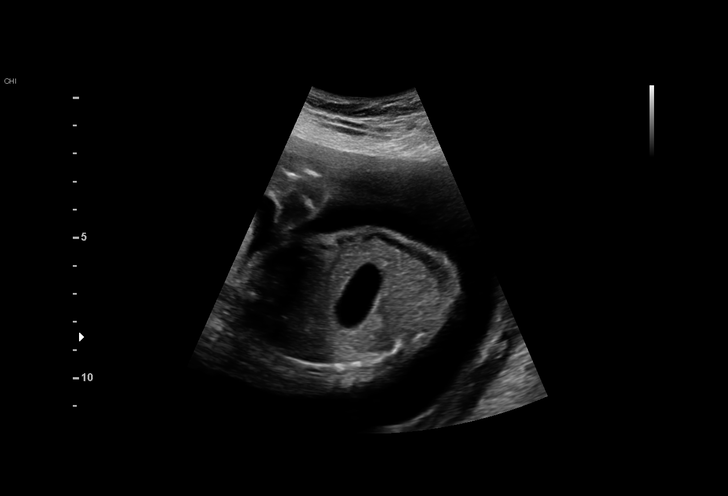
[im 75/93]
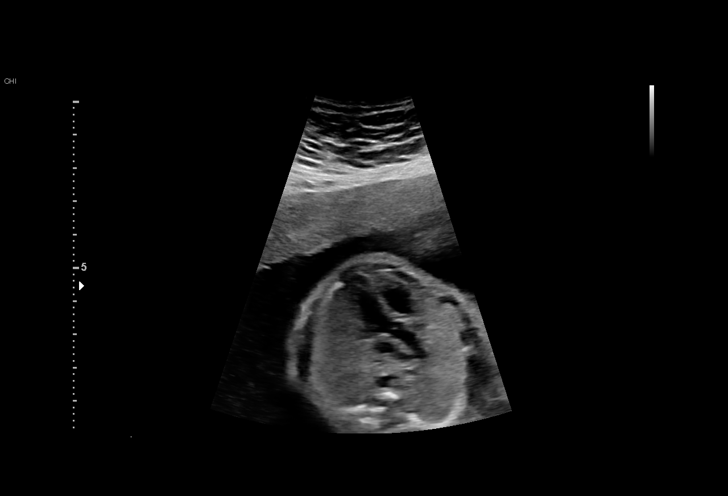
[im 82/93]
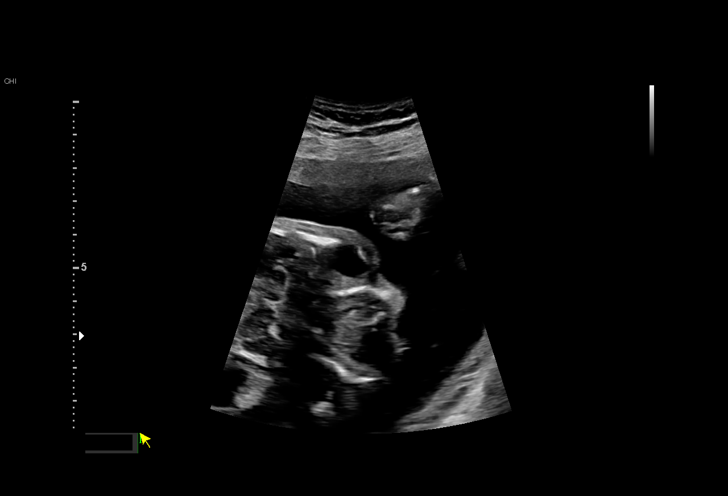
[im 89/93]
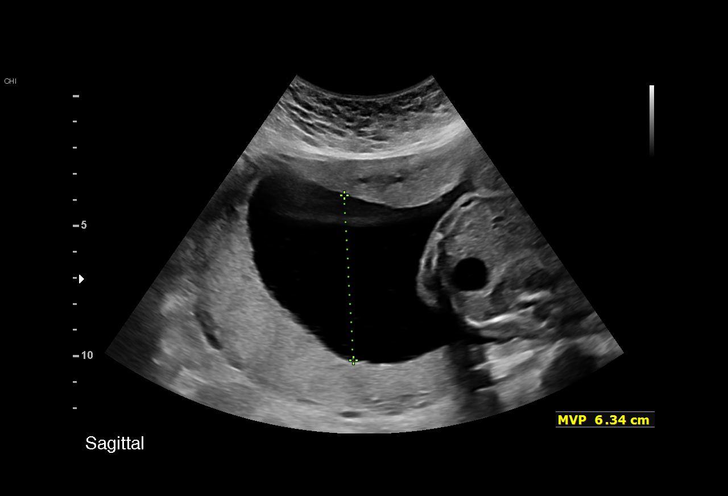

[13 of 28 positions shown; findings below may reference images not displayed]

ZOHAIB NP

Indications

 Poor obstetric history: Previous IUFD          [2U]
 (stillbirth)
 Thyroid disease in pregnancy (hypothyroid)     O99.280, [2U]
 Antenatal follow-up for nonvisualized fetal    [2U]
 anatomy
 24 weeks gestation of pregnancy
 No genetic testing.
Fetal Evaluation

 Num Of Fetuses:         1
 Fetal Heart Rate(bpm):  152
 Cardiac Activity:       Observed
 Presentation:           Breech
 Placenta:               Posterior
 P. Cord Insertion:      Previously Visualized

 Amniotic Fluid
 AFI FV:      Within normal limits

                             Largest Pocket(cm)

Biometry

 BPD:      62.6  mm     G. Age:  25w 3d         87  %    CI:        71.68   %    70 - 86
                                                         FL/HC:      18.3   %    18.7 -
 HC:      235.4  mm     G. Age:  25w 4d         86  %    HC/AC:      1.12        1.05 -
 AC:      209.3  mm     G. Age:  25w 3d         84  %    FL/BPD:     68.8   %    71 - 87
 FL:       43.1  mm     G. Age:  24w 1d         41  %    FL/AC:      20.6   %    20 - 24
 CER:      28.2  mm     G. Age:  25w 0d         85  %
 LV:       11.3  mm
 CM:        4.5  mm

 Est. FW:     757  gm    1 lb 11 oz      85  %
OB History

 Gravidity:    4         Term:   2        Prem:   1
 Living:       2
Gestational Age

 LMP:           24w 0d        Date:  [DATE]                 EDD:   [DATE]
 U/S Today:     25w 1d                                        EDD:   [DATE]
 Best:          24w 0d     Det. By:  LMP  ([DATE])          EDD:   [DATE]
Anatomy

 Cranium:               Appears normal         Aortic Arch:            Previously seen
 Cavum:                 Appears normal         Ductal Arch:            Previously seen
 Ventricles:            tVentriculomegaly,     Diaphragm:              Appears normal
                        atrium =11.4
 Choroid Plexus:        Appears normal         Stomach:                Appears normal, left
                                                                       sided
 Cerebellum:            Appears normal         Abdomen:                Appears normal
 Posterior Fossa:       Appears normal         Abdominal Wall:         Appears nml (cord
                                                                       insert, abd wall)
 Nuchal Fold:           Not applicable (>20    Cord Vessels:           Appears normal (3
                        wks GA)                                        vessel cord)
 Face:                  Orbits and profile     Kidneys:                Appear normal
                        previously seen
 Lips:                  Previously seen        Bladder:                Appears normal
 Thoracic:              Appears normal         Spine:                  Previously seen
 Heart:                 Appears normal         Upper Extremities:      Previously seen
                        (4CH, axis, and
                        situs)
 RVOT:                  Previously seen        Lower Extremities:      Previously seen
 LVOT:                  Appears normal

 Other:  Prev.fetus appears to be a male. Nasal bone and lenses prev.
         visualized. Heels/feet and open hands prev. visualized. 3VV prev vis.
Cervix Uterus Adnexa

 Cervix
 Not visualized (advanced GA >[2U])
Comments

 This patient was seen for a follow up exam due to a prior
 history of an IUFD.  She denies any problems since her last
 exam.
 She was informed that the fetal growth and amniotic fluid
 level appears appropriate for her gestational age.
 On today's exam, mild right ventriculomegaly measuring
 cm was noted in the fetal brain.  The left lateral ventricle
 appeared within normal limits.
 The patient was advised that the mild ventriculomegaly noted
 today may be related to the fetal position and may resolve
 later in her pregnancy.  She was reassured that unilateral
 mild ventriculomegaly is usually associated with a normal
 outcome after birth.  We will continue to follow her with serial
 ultrasounds to assess this finding.
 A follow up exam was scheduled in 4 weeks.
 All conversations were held with the patient today with the
 help of ZOHAIB interpreter.

## 2021-05-31 ENCOUNTER — Ambulatory Visit: Payer: Self-pay

## 2021-06-09 NOTE — BH Specialist Note (Deleted)
Integrated Behavioral Health via Telemedicine Visit  06/09/2021 Jessica Robbins [id number]  Number of Integrated Behavioral Health visits: *** Session Start time: 10:45***  Session End time: 11:15*** Total time: {IBH Total Time:21014050}  Referring Provider: *** Patient/Family location: Home*** [facility] Provider location: [facility]  All persons participating in visit: Patient *** and [facility] Jessica Robbins ***  Types of Service: {CHL AMB TYPE OF [phone]}  I connected with Jessica Robbins and/or Jessica Robbins's {family members:20773} via  Telephone or Engineer, civil (consulting)  (Video is Caregility application) and verified that I am speaking with the correct person using two identifiers. Discussed confidentiality: {YES/NO:21197}  I discussed the limitations of telemedicine and the availability of in person appointments.  Discussed there is a possibility of technology failure and discussed alternative modes of communication if that failure occurs.  I discussed that engaging in this telemedicine visit, they consent to the provision of behavioral healthcare and the services will be billed under their insurance.  Patient and/or legal guardian expressed understanding and consented to Telemedicine visit: {YES/NO:21197}  Presenting Concerns: Patient and/or family reports the following symptoms/concerns: *** Duration of problem: ***; Severity of problem: {Mild/Moderate/Severe:20260}  Patient and/or Family's Strengths/Protective Factors: {CHL AMB BH PROTECTIVE FACTORS:[id number]}  Goals Addressed: Patient will:  Reduce symptoms of: {IBH Symptoms:21014056}   Increase knowledge and/or ability of: {IBH Patient Tools:21014057}   Demonstrate ability to: {IBH Goals:21014053}  Progress towards Goals: {CHL AMB BH PROGRESS TOWARDS GOALS:[phone]}  Interventions: Interventions utilized:  {IBH Interventions:21014054} Standardized  Assessments completed: {IBH Screening Tools:21014051}  Patient and/or Family Response: ***  Assessment: Patient currently experiencing ***.   Patient may benefit from ***.  Plan: Follow up with behavioral health clinician on : *** Behavioral recommendations: *** Referral(s): {IBH Referrals:21014055}  I discussed the assessment and treatment plan with the patient and/or parent/guardian. They were provided an opportunity to ask questions and all were answered. They agreed with the plan and demonstrated an understanding of the instructions.   They were advised to call back or seek an in-person evaluation if the symptoms worsen or if the condition fails to improve as anticipated.  Valetta Close Cassady Stanczak, LCSW

## 2021-06-21 ENCOUNTER — Encounter: Payer: Self-pay | Admitting: Obstetrics & Gynecology

## 2021-06-21 ENCOUNTER — Other Ambulatory Visit: Payer: Self-pay

## 2021-06-21 ENCOUNTER — Ambulatory Visit (INDEPENDENT_AMBULATORY_CARE_PROVIDER_SITE_OTHER): Payer: Medicaid Other | Admitting: Obstetrics & Gynecology

## 2021-06-21 VITALS — BP 102/62 | HR 102 | Wt 161.1 lb

## 2021-06-21 DIAGNOSIS — O99013 Anemia complicating pregnancy, third trimester: Secondary | ICD-10-CM

## 2021-06-21 DIAGNOSIS — E039 Hypothyroidism, unspecified: Secondary | ICD-10-CM

## 2021-06-21 DIAGNOSIS — Z3A27 27 weeks gestation of pregnancy: Secondary | ICD-10-CM

## 2021-06-21 DIAGNOSIS — D509 Iron deficiency anemia, unspecified: Secondary | ICD-10-CM

## 2021-06-21 DIAGNOSIS — R21 Rash and other nonspecific skin eruption: Secondary | ICD-10-CM

## 2021-06-21 DIAGNOSIS — O0990 Supervision of high risk pregnancy, unspecified, unspecified trimester: Secondary | ICD-10-CM

## 2021-06-21 DIAGNOSIS — Z8759 Personal history of other complications of pregnancy, childbirth and the puerperium: Secondary | ICD-10-CM

## 2021-06-21 NOTE — Progress Notes (Signed)
   PRENATAL VISIT NOTE  Subjective:  Jessica Robbins is a 20 y.o. [id number] at [redacted]w[redacted]d being seen today for ongoing prenatal care. [name] present.  She is currently monitored for the following issues for this high-risk pregnancy and has Supervision of high risk pregnancy, antepartum; History of IUFD; Language barrier; and Hypothyroidism on their problem list.  Patient reports itchy rash on second and fourth fingers of her right hand Contractions: Not present. Vag. Bleeding: None.  Movement: Present. Denies leaking of fluid.   The following portions of the patient's history were reviewed and updated as appropriate: allergies, current medications, past family history, past medical history, past social history, past surgical history and problem list.   Objective:   Vitals:   06/21/21 0951  BP: 102/62  Pulse: (!) 102  Weight: 161 lb 1.6 oz (73.1 kg)    Fetal Status: Fetal Heart Rate (bpm): 145 Fundal Height: 27 cm Movement: Present     General:  Alert, oriented and cooperative. Patient is in no acute distress.  Skin: Skin is warm and dry. Mild eryhtematous rash noted on two fingers of her right hand, 2nd and 4th  Cardiovascular: Normal heart rate noted  Respiratory: Normal respiratory effort, no problems with respiration noted  Abdomen: Soft, gravid, appropriate for gestational age.  Pain/Pressure: Absent     Pelvic: Cervical exam deferred        Extremities: Normal range of motion.  Edema: None  Mental Status: Normal mood and affect. Normal behavior. Normal judgment and thought content.   Assessment and Plan:  Pregnancy: G4P2102 at [redacted]w[redacted]d 1. Rash and nonspecific skin eruption Recommended OTC hydrocortisone cream  2. History of IUFD Antenatal testing to start at 32 weeks, MFM scans as recommended  3. Hypothyroidism, unspecified type Labs ordered, will follow up results and manage accordingly. - T4, free; Future - T3, free; Future - TSH; Future  4. [redacted] weeks gestation of  pregnancy 5. Supervision of high risk pregnancy, antepartum Will return for fasting labs next week. - Glucose Tolerance, 2 Hours w/1 Hour; Future - CBC; Future - HIV Antibody (routine testing w rflx); Future - RPR; Future  Preterm labor symptoms and general obstetric precautions including but not limited to vaginal bleeding, contractions, leaking of fluid and fetal movement were reviewed in detail with the patient. Please refer to After Visit Summary for other counseling recommendations.   Return in about 5 days (around 06/26/2021) for Third trimester labs    3 weeks fron now:  OFFICE OB VISIT (MD or APP).  Future Appointments  Date Time Provider Department Center  06/27/2021  8:20 AM [facility] LAB [facility]  06/27/2021  9:15 AM [facility] NURSE [facility]  06/27/2021  9:30 AM [facility] US3 [facility]  06/28/2021  1:15 PM [facility]-BEHAVIORAL HEALTH CLINICIAN [facility]    Jaynie Collins, MD

## 2021-06-21 NOTE — Patient Instructions (Addendum)
Can use hydrocortisone cream  for rash   Return to office for any scheduled appointments. Call the office or go to the MAU at [facility] if: You begin to have strong, frequent contractions Your water breaks.  Sometimes it is a big gush of fluid, sometimes it is just a trickle that keeps getting your panties wet or running down your legs You have vaginal bleeding.  It is normal to have a small amount of spotting if your cervix was checked.  You do not feel your baby moving like normal.  If you do not, get something to eat and drink and lay down and focus on feeling your baby move.   If your baby is still not moving like normal, you should call the office or go to MAU. Any other obstetric concerns.

## 2021-06-26 ENCOUNTER — Ambulatory Visit: Payer: Medicaid Other

## 2021-06-26 ENCOUNTER — Other Ambulatory Visit: Payer: Medicaid Other

## 2021-06-26 NOTE — BH Specialist Note (Deleted)
Integrated Behavioral Health via Telemedicine Visit  06/26/2021 Keeva Reisen [id number]  Number of Integrated Behavioral Health visits: *** Session Start time: ***  Session End time: *** Total time: {IBH Total Time:21014050}  Referring Provider: *** Patient/Family location: *** [facility] Provider location: *** All persons participating in visit: *** Types of Service: {CHL AMB TYPE OF SERVICE:[phone]}  I connected with Bijal Judon and/or Claude Zeoli's {family members:20773} via  Telephone or Engineer, civil (consulting)  (Video is Caregility application) and verified that I am speaking with the correct person using two identifiers. Discussed confidentiality: {YES/NO:21197}  I discussed the limitations of telemedicine and the availability of in person appointments.  Discussed there is a possibility of technology failure and discussed alternative modes of communication if that failure occurs.  I discussed that engaging in this telemedicine visit, they consent to the provision of behavioral healthcare and the services will be billed under their insurance.  Patient and/or legal guardian expressed understanding and consented to Telemedicine visit: {YES/NO:21197}  Presenting Concerns: Patient and/or family reports the following symptoms/concerns: *** Duration of problem: ***; Severity of problem: {Mild/Moderate/Severe:20260}  Patient and/or Family's Strengths/Protective Factors: {CHL AMB BH PROTECTIVE [phone]}  Goals Addressed: Patient will:  Reduce symptoms of: {IBH Symptoms:21014056}   Increase knowledge and/or ability of: {IBH Patient Tools:21014057}   Demonstrate ability to: {IBH Goals:21014053}  Progress towards Goals: {CHL AMB BH PROGRESS TOWARDS [phone]}  Interventions: Interventions utilized:  {IBH Interventions:21014054} Standardized Assessments completed: {IBH Screening Tools:21014051}  Patient and/or Family Response:  ***  Assessment: Patient currently experiencing ***.   Patient may benefit from ***.  Plan: Follow up with behavioral health clinician on : *** Behavioral recommendations: *** Referral(s): {IBH Referrals:21014055}  I discussed the assessment and treatment plan with the patient and/or parent/guardian. They were provided an opportunity to ask questions and all were answered. They agreed with the plan and demonstrated an understanding of the instructions.   They were advised to call back or seek an in-person evaluation if the symptoms worsen or if the condition fails to improve as anticipated.  Valetta Close Atavia Poppe, LCSW

## 2021-06-27 ENCOUNTER — Other Ambulatory Visit: Payer: Self-pay | Admitting: *Deleted

## 2021-06-27 ENCOUNTER — Ambulatory Visit: Payer: Medicaid Other | Admitting: *Deleted

## 2021-06-27 ENCOUNTER — Ambulatory Visit: Payer: Medicaid Other | Attending: Obstetrics

## 2021-06-27 ENCOUNTER — Encounter: Payer: Self-pay | Admitting: *Deleted

## 2021-06-27 ENCOUNTER — Other Ambulatory Visit: Payer: Self-pay

## 2021-06-27 ENCOUNTER — Other Ambulatory Visit: Payer: Medicaid Other

## 2021-06-27 VITALS — BP 105/52 | HR 78

## 2021-06-27 DIAGNOSIS — Z362 Encounter for other antenatal screening follow-up: Secondary | ICD-10-CM | POA: Diagnosis not present

## 2021-06-27 DIAGNOSIS — Z3A28 28 weeks gestation of pregnancy: Secondary | ICD-10-CM

## 2021-06-27 DIAGNOSIS — E079 Disorder of thyroid, unspecified: Secondary | ICD-10-CM

## 2021-06-27 DIAGNOSIS — Z8759 Personal history of other complications of pregnancy, childbirth and the puerperium: Secondary | ICD-10-CM

## 2021-06-27 DIAGNOSIS — E039 Hypothyroidism, unspecified: Secondary | ICD-10-CM

## 2021-06-27 DIAGNOSIS — O09293 Supervision of pregnancy with other poor reproductive or obstetric history, third trimester: Secondary | ICD-10-CM | POA: Diagnosis not present

## 2021-06-27 DIAGNOSIS — Z789 Other specified health status: Secondary | ICD-10-CM

## 2021-06-27 DIAGNOSIS — O358XX0 Maternal care for other (suspected) fetal abnormality and damage, not applicable or unspecified: Secondary | ICD-10-CM | POA: Insufficient documentation

## 2021-06-27 DIAGNOSIS — O0990 Supervision of high risk pregnancy, unspecified, unspecified trimester: Secondary | ICD-10-CM | POA: Diagnosis not present

## 2021-06-27 DIAGNOSIS — Z3689 Encounter for other specified antenatal screening: Secondary | ICD-10-CM

## 2021-06-27 DIAGNOSIS — O99283 Endocrine, nutritional and metabolic diseases complicating pregnancy, third trimester: Secondary | ICD-10-CM | POA: Diagnosis not present

## 2021-06-27 DIAGNOSIS — IMO0002 Reserved for concepts with insufficient information to code with codable children: Secondary | ICD-10-CM

## 2021-06-27 DIAGNOSIS — O3509X0 Maternal care for (suspected) other central nervous system malformation or damage in fetus, not applicable or unspecified: Secondary | ICD-10-CM

## 2021-06-27 DIAGNOSIS — Z3A27 27 weeks gestation of pregnancy: Secondary | ICD-10-CM | POA: Diagnosis not present

## 2021-06-27 IMAGING — US US MFM OB FOLLOW-UP
1 series · 13 of 28 positions shown · non-contrast
Comparison: none

[Series 1: us mfm ob follow-up · 63 acquisitions, 13 frames shown]
[im 3/63]
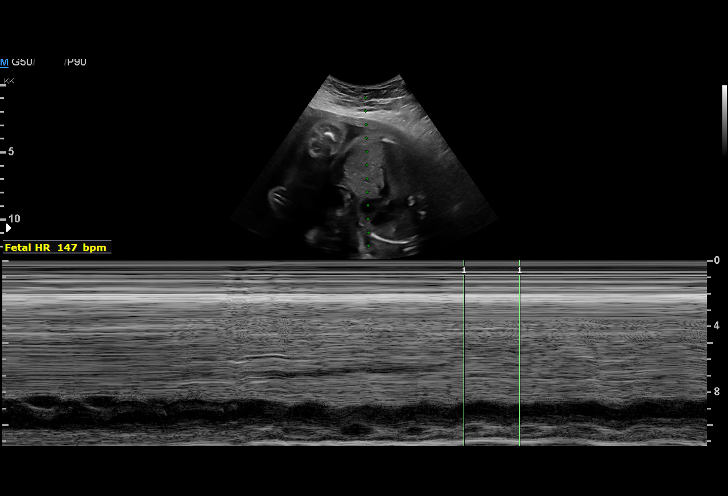
[im 7/63]
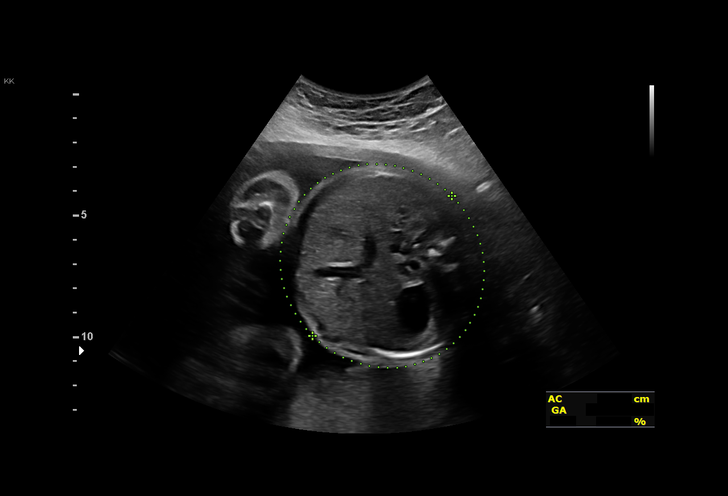
[im 12/63]
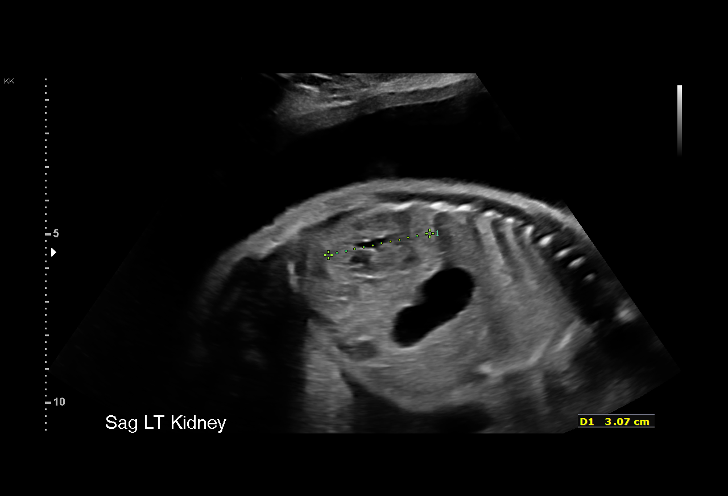
[im 17/63]
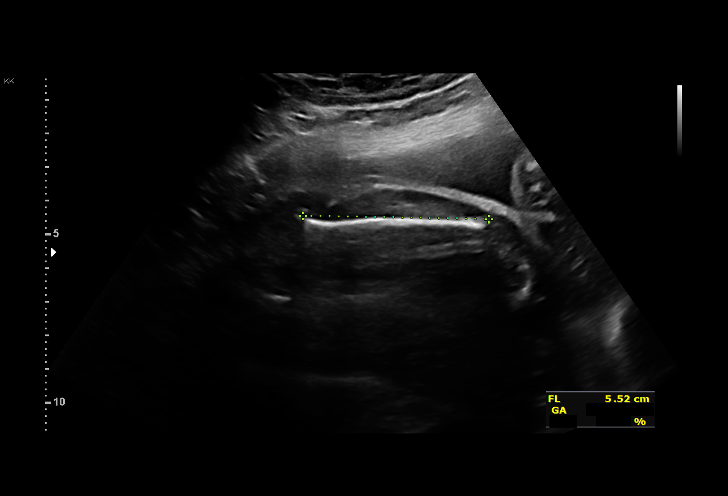
[im 21/63]
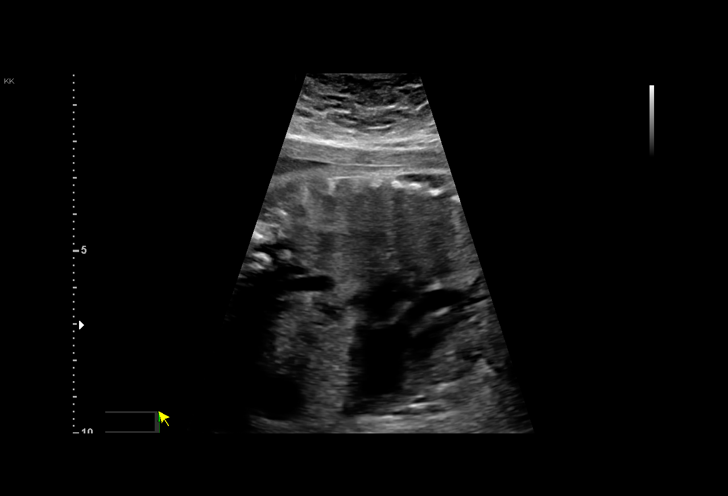
[im 26/63]
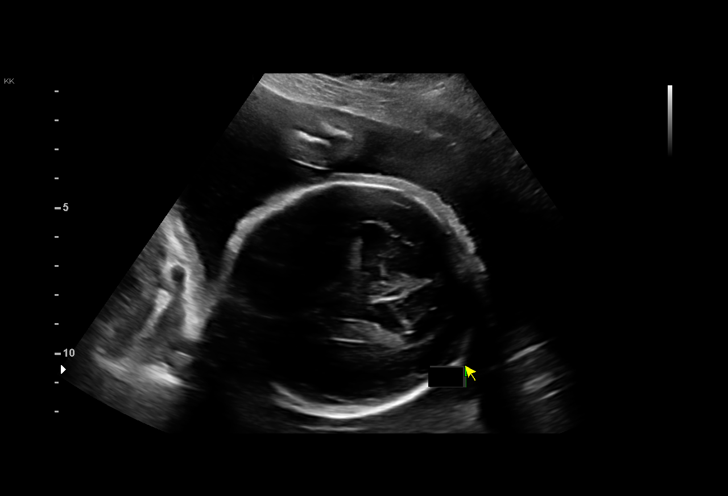
[im 33/63]
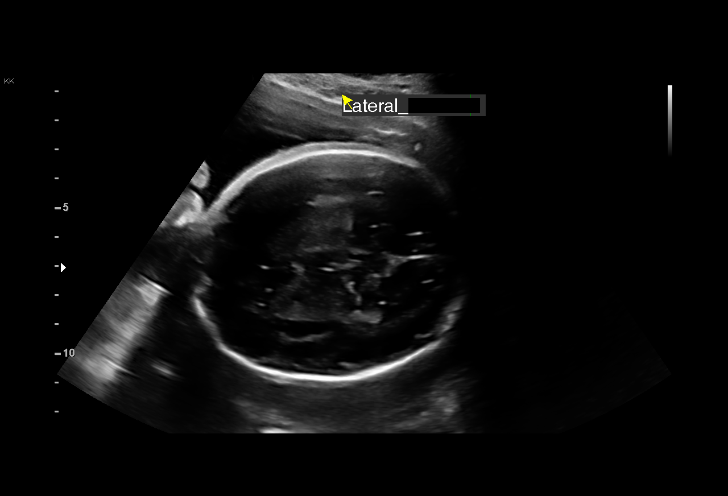
[im 37/63]
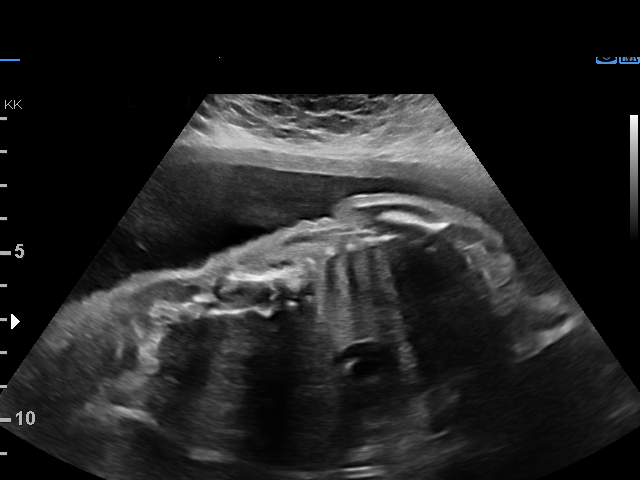
[im 42/63]
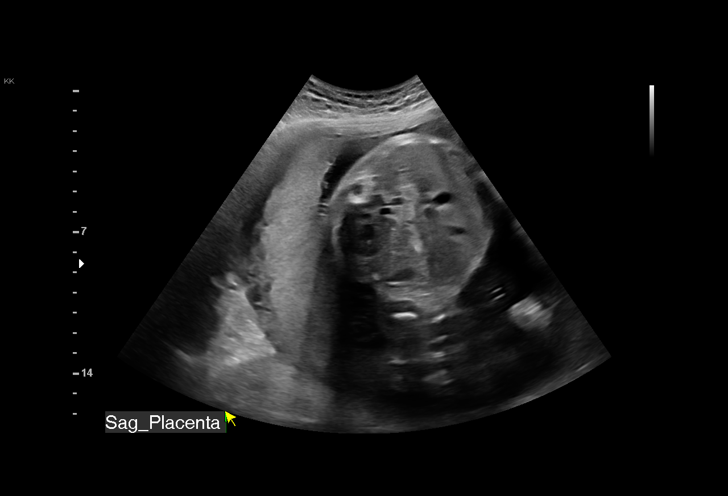
[im 46/63]
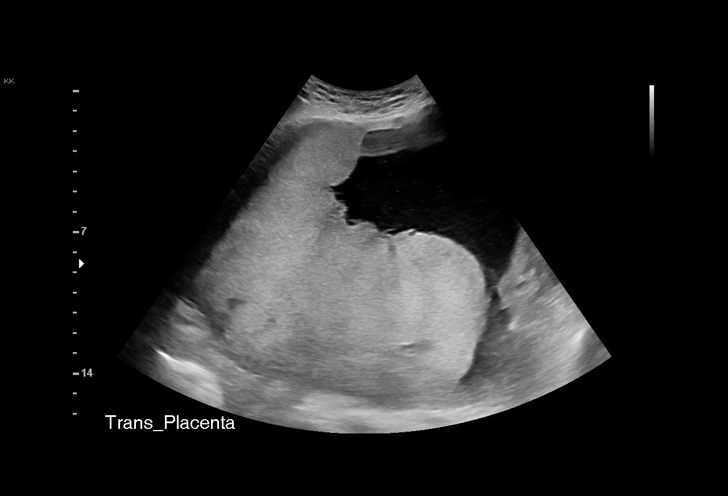
[im 51/63]
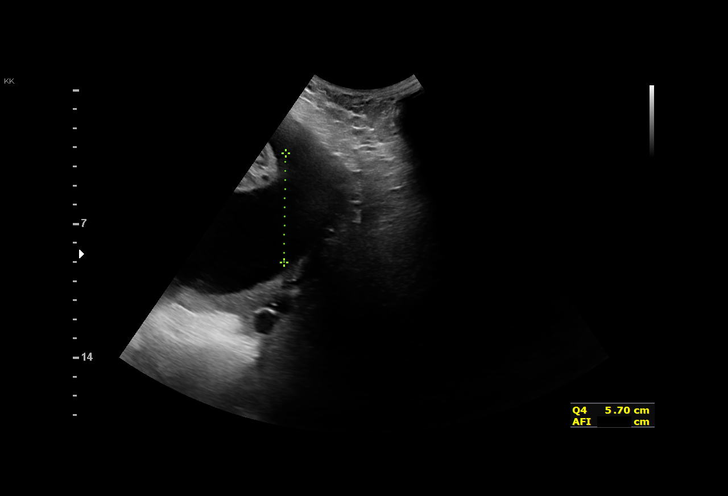
[im 56/63]
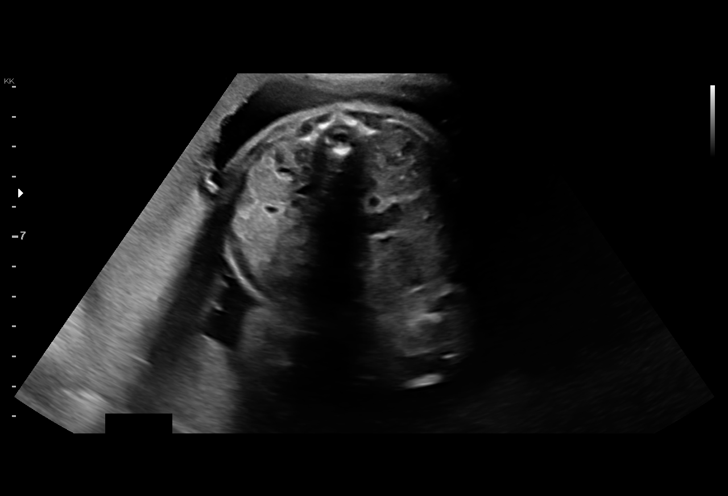
[im 60/63]
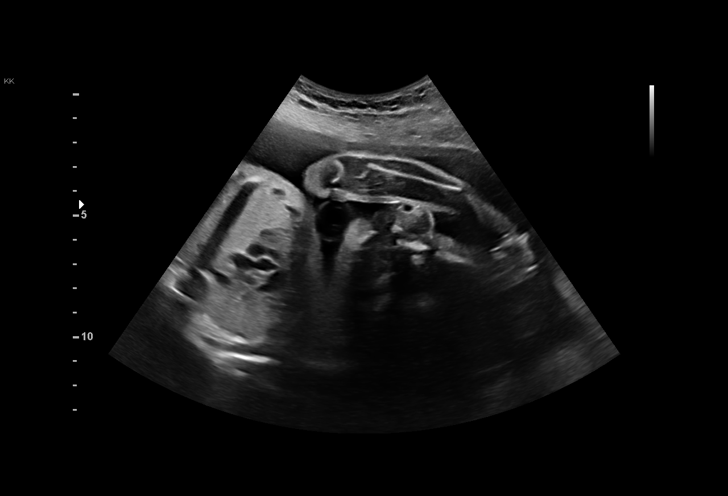

[13 of 28 positions shown; findings below may reference images not displayed]

[facility] NP

Indications

 Cerebral ventriculomegaly                      [CS]
 Poor obstetric history: Previous IUFD          [CS]
 (stillbirth)
 28 weeks gestation of pregnancy
 Thyroid disease in pregnancy (hypothyroid)     O99.280, [CS]
 Low Risk NIPS(Negative Horizon)
 Encounter for other antenatal screening        [CS]
 follow-up
Fetal Evaluation

 Num Of Fetuses:         1
 Fetal Heart Rate(bpm):  147
 Cardiac Activity:       Observed
 Presentation:           Cephalic
 Placenta:               Posterior
 P. Cord Insertion:      Previously Visualized

 Amniotic Fluid
 AFI FV:      Subjectively upper-normal

 AFI Sum(cm)     %Tile       Largest Pocket(cm)
 23.8            96

 RUQ(cm)       RLQ(cm)       LUQ(cm)        LLQ(cm)
 7.4           5.7           7
Biometry
 BPD:      77.6  mm     G. Age:  31w 1d       > 99  %    CI:        78.82   %    70 - 86
                                                         FL/HC:      19.8   %    18.8 -
 HC:      276.4  mm     G. Age:  30w 2d         85  %    HC/AC:      1.04        1.05 -
 AC:      264.6  mm     G. Age:  30w 4d         97  %    FL/BPD:     70.5   %    71 - 87
 FL:       54.7  mm     G. Age:  28w 6d         62  %    FL/AC:      20.7   %    20 - 24
 LV:         10  mm

 Est. FW:    [CS]  gm      3 lb 5 oz     97  %
OB History

 Gravidity:    4         Term:   2        Prem:   1
 Living:       2
Gestational Age

 LMP:           28w 0d        Date:  [DATE]                 EDD:   [DATE]
 U/S Today:     30w 2d                                        EDD:   [DATE]
 Best:          28w 0d     Det. By:  LMP  ([DATE])          EDD:   [DATE]
Anatomy

 Cranium:               Appears normal         Aortic Arch:            Previously seen
 Cavum:                 Appears normal         Ductal Arch:            Previously seen
 Ventricles:            Ventriculomegaly       Diaphragm:              Appears normal
                        Right
 Choroid Plexus:        Previously seen        Stomach:                Appears normal, left
                                                                       sided
 Cerebellum:            Appears normal         Abdomen:                Appears normal
 Posterior Fossa:       Previously seen        Abdominal Wall:         Previously seen
 Nuchal Fold:           Not applicable (>20    Cord Vessels:           Previously seen
                        wks GA)
 Face:                  Orbits and profile     Kidneys:                Appear normal
                        previously seen
 Lips:                  Previously seen        Bladder:                Appears normal
 Thoracic:              Appears normal         Spine:                  Previously seen
 Heart:                 Appears normal         Upper Extremities:      Previously seen
                        (4CH, axis, and
                        situs)
 RVOT:                  Previously seen        Lower Extremities:      Previously seen
 LVOT:                  Appears normal

 Other:  Prev.fetus appears to be a male. Nasal bone and lenses prev.
         visualized. Heels/feet and open hands prev. visualized. 3VV prev vis.
Cervix Uterus Adnexa

 Cervix
 Not visualized (advanced GA >[CS])
Impression

 Patient returned for fetal growth assessment and evaluation
 of intracranial structures.  Ventriculomegaly was seen at
 previous scan.

 On today's ultrasound, the estimated fetal weight is at the
 97th percentile.  Amniotic fluid is normal and good fetal
 activity seen.  Unilateral isolated ventriculomegaly (right)
 measuring 10 mm is seen.  Rest of the intracranial structures
 appear normal.

 I reassured the patient with help of language interpreter ([facility]
 services).
 Patient had screening for GDM today.
Recommendations

 -An appointment was made for her to return in 4 weeks for
 fetal growth assessment.
                 S[facility]OS

## 2021-06-28 ENCOUNTER — Telehealth: Payer: Self-pay

## 2021-06-28 ENCOUNTER — Encounter: Payer: Self-pay | Admitting: Obstetrics & Gynecology

## 2021-06-28 DIAGNOSIS — O99013 Anemia complicating pregnancy, third trimester: Secondary | ICD-10-CM

## 2021-06-28 HISTORY — DX: Anemia complicating pregnancy, third trimester: O99.013

## 2021-06-28 LAB — CBC
Hematocrit: 29.4 % — ABNORMAL LOW (ref 34.0–46.6)
Hemoglobin: 9.7 g/dL — ABNORMAL LOW (ref 11.1–15.9)
MCH: 25.2 pg — ABNORMAL LOW (ref 26.6–33.0)
MCHC: 33 g/dL (ref 31.5–35.7)
MCV: 76 fL — ABNORMAL LOW (ref 79–97)
Platelets: 302 10*3/uL (ref 150–450)
RBC: 3.85 x10E6/uL (ref 3.77–5.28)
RDW: 13.9 % (ref 11.7–15.4)
WBC: 8.6 10*3/uL (ref 3.4–10.8)

## 2021-06-28 LAB — HIV ANTIBODY (ROUTINE TESTING W REFLEX): HIV Screen 4th Generation wRfx: NONREACTIVE

## 2021-06-28 LAB — GLUCOSE TOLERANCE, 2 HOURS W/ 1HR
Glucose, 1 hour: 142 mg/dL (ref 70–179)
Glucose, 2 hour: 109 mg/dL (ref 70–152)
Glucose, Fasting: 76 mg/dL (ref 70–91)

## 2021-06-28 LAB — T4, FREE: Free T4: 0.88 ng/dL (ref 0.82–1.77)

## 2021-06-28 LAB — TSH: TSH: 3.3 u[IU]/mL (ref 0.450–4.500)

## 2021-06-28 LAB — T3, FREE: T3, Free: 3 pg/mL (ref 2.0–4.4)

## 2021-06-28 LAB — RPR: RPR Ser Ql: NONREACTIVE

## 2021-06-28 MED ORDER — FERROUS SULFATE 325 (65 FE) MG PO TABS: 325.0000 mg | ORAL_TABLET | ORAL | 3 refills | Status: DC

## 2021-06-28 NOTE — Telephone Encounter (Signed)
Pt's sponsor [name] called into nurse line and wanting to cancel [facility] appt today at 1:15pm due to husband at work and having one phone. Advised this will be cancelled. Rescheduled [facility] appt for 10/19 at 2:15pm. Agreeable to date and time of appt.  Sponsor also given results of CBC and the need for iron. Pt does not want to do IV iron, will take Iron supplement. Pt aware of Rx sent to pharmacy and to start taking. Pt verbalized understanding and agreeable to plan of care.   Judeth Cornfield, RN

## 2021-06-28 NOTE — BH Specialist Note (Signed)
Integrated Behavioral Health via Telemedicine Visit  06/28/2021 [name] [id number]  Number of Integrated Behavioral Health visits: 5 Session Start time: 2:07  Session End time: 2:22 Total time: 15  Referring Provider: Nolene Bernheim, NP Patient/Family location: Home [facility] Provider location: [facility]  All persons participating in visit: Patient Jessica Robbins and [facility] Ch[facility]mi   Types of Service: Video visit and General Behavioral Integrated Care (BHI)  I connected with Kerrie Pleasure and/or Lovene Mangal's  n/a  via  Telephone or [employer]  (Video is Caregility application) and verified that I am speaking with the correct person using two identifiers. Discussed confidentiality: Yes   I discussed the limitations of telemedicine and the availability of in person appointments.  Discussed there is a possibility of technology failure and discussed alternative modes of communication if that failure occurs.  I discussed that engaging in this telemedicine visit, they consent to the provision of behavioral healthcare and the services will be billed under their insurance.  Patient and/or legal guardian expressed understanding and consented to Telemedicine visit: Yes   Presenting Concerns: Patient and/or family reports the following symptoms/concerns: Lower abdominal pain (on sides) when walking; mild fatigue; no other questions or concerns at this time.  Duration of problem: Current pregnancy; Severity of problem: mild  Patient and/or Family's Strengths/Protective Factors: Social connections, Concrete supports in place (healthy food, safe environments, etc.), and Sense of purpose  Goals Addressed: Patient will:  Maintain reduction of  symptoms of: anxiety and depression   Increase knowledge and/or ability of: healthy habits   Demonstrate ability to: Increase healthy adjustment to current life  circumstances  Progress towards Goals: Ongoing  Interventions: Interventions utilized:  Solution-Focused Strategies Standardized Assessments completed: Not Needed  Patient and/or Family Response: Pt agrees with treatment plan  Assessment: Patient currently experiencing Adjustment disorder with mixed anxious and depressed mood.   Patient may benefit from therapeutic intervention today.  Plan: Follow up with behavioral health clinician on : As needed Behavioral recommendations:  -Discuss with medical provider at upcoming visit concern about lower abdominal side pain - Referral(s): Integrated [employer] (In Clinic)  I discussed the assessment and treatment plan with the patient and/or parent/guardian. They were provided an opportunity to ask questions and all were answered. They agreed with the plan and demonstrated an understanding of the instructions.   They were advised to call back or seek an in-person evaluation if the symptoms worsen or if the condition fails to improve as anticipated.  Rae Lips, LCSW  Depression screen [facility] 2/9 06/21/2021 05/09/2021 04/25/2021 03/06/2021 02/27/2021  Decreased Interest 1 1 1 1 3   Down, Depressed, Hopeless 0 1 0 1 3  PHQ - 2 Score 1 2 1 2 6   Altered sleeping 1 1 1 1 3   Tired, decreased energy 1 1 1 1 3   Change in appetite 1 0 1 0 3  Feeling bad or failure about yourself  0 0 1 0 0  Trouble concentrating 0 0 0 3 1  Moving slowly or fidgety/restless 0 0 0 0 0  Suicidal thoughts 0 0 0 0 0  PHQ-9 Score 4 4 5 7 16   Difficult doing work/chores Not difficult at all - - - -   GAD 7 : Generalized Anxiety Score 06/21/2021 05/09/2021 04/25/2021 03/06/2021  Nervous, Anxious, on Edge 0 0 1 2  Control/stop worrying 0 0 1 2  Worry too much - different things 0  0 1 2  Trouble relaxing 0 0 1 2  Restless 0 0 1 2  Easily annoyed or irritable 0 0 1 2  Afraid - awful might happen 0 0 1 2  Total GAD 7 Score 0 0 7 14  Anxiety Difficulty Not  difficult at all - - -

## 2021-06-28 NOTE — Telephone Encounter (Signed)
-----   Message from Tereso Newcomer, MD sent at 06/28/2021  9:06 AM EDT ----- Given hemoglobin of 9.7, will recommend Venofer but if patient unwilling to do this, oral iron can be tried.  Recheck hemoglobin at 32 weeks.

## 2021-06-28 NOTE — Addendum Note (Signed)
Addended by: Jaynie Collins A on: 06/28/2021 09:06 AM   Modules accepted: Orders, SmartSet

## 2021-07-05 ENCOUNTER — Ambulatory Visit: Payer: Medicaid Other | Admitting: Clinical

## 2021-07-05 DIAGNOSIS — F4323 Adjustment disorder with mixed anxiety and depressed mood: Secondary | ICD-10-CM

## 2021-07-19 ENCOUNTER — Encounter: Payer: Self-pay | Admitting: Obstetrics and Gynecology

## 2021-07-19 ENCOUNTER — Ambulatory Visit (INDEPENDENT_AMBULATORY_CARE_PROVIDER_SITE_OTHER): Payer: Medicaid Other | Admitting: Obstetrics and Gynecology

## 2021-07-19 ENCOUNTER — Other Ambulatory Visit: Payer: Self-pay

## 2021-07-19 VITALS — BP 113/63 | HR 97 | Wt 163.1 lb

## 2021-07-19 DIAGNOSIS — O99013 Anemia complicating pregnancy, third trimester: Secondary | ICD-10-CM

## 2021-07-19 DIAGNOSIS — Z23 Encounter for immunization: Secondary | ICD-10-CM

## 2021-07-19 DIAGNOSIS — Z789 Other specified health status: Secondary | ICD-10-CM

## 2021-07-19 DIAGNOSIS — O0990 Supervision of high risk pregnancy, unspecified, unspecified trimester: Secondary | ICD-10-CM

## 2021-07-19 DIAGNOSIS — Z8759 Personal history of other complications of pregnancy, childbirth and the puerperium: Secondary | ICD-10-CM

## 2021-07-19 NOTE — Progress Notes (Signed)
   PRENATAL VISIT NOTE  Subjective:  Jessica Robbins is a 20 y.o. [id number] at [redacted]w[redacted]d being seen today for ongoing prenatal care.  She is currently monitored for the following issues for this low-risk pregnancy and has Supervision of high risk pregnancy, antepartum; History of IUFD; Language barrier; Hypothyroidism; and Anemia affecting pregnancy in third trimester on their problem list.  Patient reports  carpal tunnel symptoms .  Contractions: Not present. Vag. Bleeding: None.  Movement: Present. Denies leaking of fluid.   The following portions of the patient's history were reviewed and updated as appropriate: allergies, current medications, past family history, past medical history, past social history, past surgical history and problem list.   Objective:   Vitals:   07/19/21 1102  BP: 113/63  Pulse: 97  Weight: 163 lb 1.6 oz (74 kg)    Fetal Status: Fetal Heart Rate (bpm): 139 Fundal Height: 31 cm Movement: Present     General:  Alert, oriented and cooperative. Patient is in no acute distress.  Skin: Skin is warm and dry. No rash noted.   Cardiovascular: Normal heart rate noted  Respiratory: Normal respiratory effort, no problems with respiration noted  Abdomen: Soft, gravid, appropriate for gestational age.  Pain/Pressure: Present     Pelvic: Cervical exam deferred        Extremities: Normal range of motion.  Edema: None  Mental Status: Normal mood and affect. Normal behavior. Normal judgment and thought content.   Assessment and Plan:  Pregnancy: [id number] at [redacted]w[redacted]d 1. Supervision of high risk pregnancy, antepartum - Discussed Nexplanon and IUD again. She will consider. She would like to talk to her spouse.   2. History of IUFD - Scheduled to start NST/BPP next week  3. Language barrier - In person interpreter used  4. Anemia affecting pregnancy in third trimester - Continue PO iron - CBC; Future - check in 2 weeks  Preterm labor symptoms and general obstetric precautions  including but not limited to vaginal bleeding, contractions, leaking of fluid and fetal movement were reviewed in detail with the patient. Please refer to After Visit Summary for other counseling recommendations.   Return in about 2 weeks (around 08/02/2021) for OB VISIT, MD or APP, Needs BPP/NST weekly.  Future Appointments  Date Time Provider Department Center  07/25/2021  3:30 PM [facility] NURSE [facility]  07/25/2021  3:45 PM [facility] US4 [facility]  08/02/2021  9:15 AM [facility] NST [facility]  08/02/2021 10:15 AM Federico Flake, MD [facility]  08/09/2021  9:15 AM [facility] NST [facility]    Milas Hock, MD

## 2021-07-25 ENCOUNTER — Other Ambulatory Visit: Payer: Self-pay

## 2021-07-25 ENCOUNTER — Encounter: Payer: Self-pay | Admitting: *Deleted

## 2021-07-25 ENCOUNTER — Ambulatory Visit: Payer: Medicaid Other | Admitting: *Deleted

## 2021-07-25 ENCOUNTER — Ambulatory Visit: Payer: Medicaid Other | Attending: Obstetrics and Gynecology

## 2021-07-25 VITALS — BP 114/57 | HR 92

## 2021-07-25 DIAGNOSIS — Z789 Other specified health status: Secondary | ICD-10-CM | POA: Diagnosis present

## 2021-07-25 DIAGNOSIS — Z3A32 32 weeks gestation of pregnancy: Secondary | ICD-10-CM

## 2021-07-25 DIAGNOSIS — Z8759 Personal history of other complications of pregnancy, childbirth and the puerperium: Secondary | ICD-10-CM

## 2021-07-25 DIAGNOSIS — E039 Hypothyroidism, unspecified: Secondary | ICD-10-CM | POA: Diagnosis present

## 2021-07-25 DIAGNOSIS — O99280 Endocrine, nutritional and metabolic diseases complicating pregnancy, unspecified trimester: Secondary | ICD-10-CM | POA: Diagnosis present

## 2021-07-25 DIAGNOSIS — O0990 Supervision of high risk pregnancy, unspecified, unspecified trimester: Secondary | ICD-10-CM

## 2021-07-25 DIAGNOSIS — O99013 Anemia complicating pregnancy, third trimester: Secondary | ICD-10-CM

## 2021-07-25 DIAGNOSIS — O99283 Endocrine, nutritional and metabolic diseases complicating pregnancy, third trimester: Secondary | ICD-10-CM

## 2021-07-25 DIAGNOSIS — Z3689 Encounter for other specified antenatal screening: Secondary | ICD-10-CM | POA: Insufficient documentation

## 2021-07-25 DIAGNOSIS — O09293 Supervision of pregnancy with other poor reproductive or obstetric history, third trimester: Secondary | ICD-10-CM | POA: Diagnosis not present

## 2021-07-25 IMAGING — US US MFM FETAL BPP W/O NON-STRESS
1 series · 13 of 28 positions shown · non-contrast
Comparison: none

[Series 1: us mfm fetal bpp w/o non-stress · 68 acquisitions, 13 frames shown]
[im 3/68]
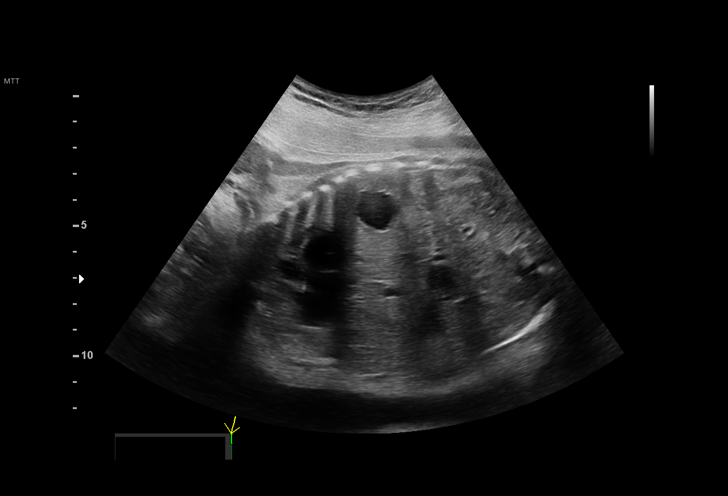
[im 8/68]
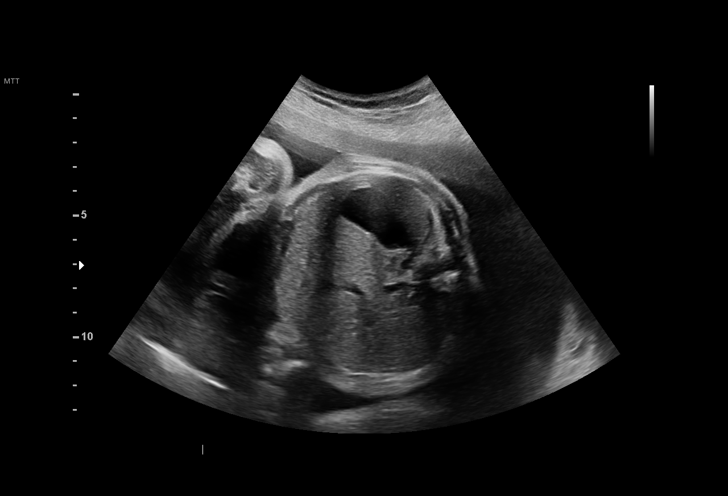
[im 13/68]
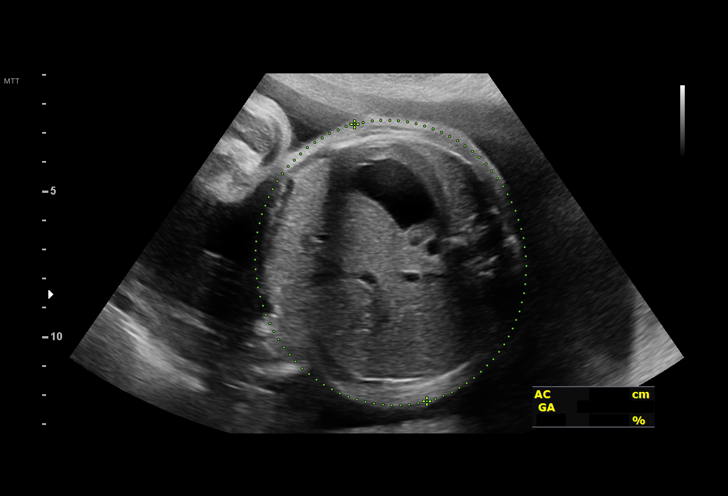
[im 18/68]
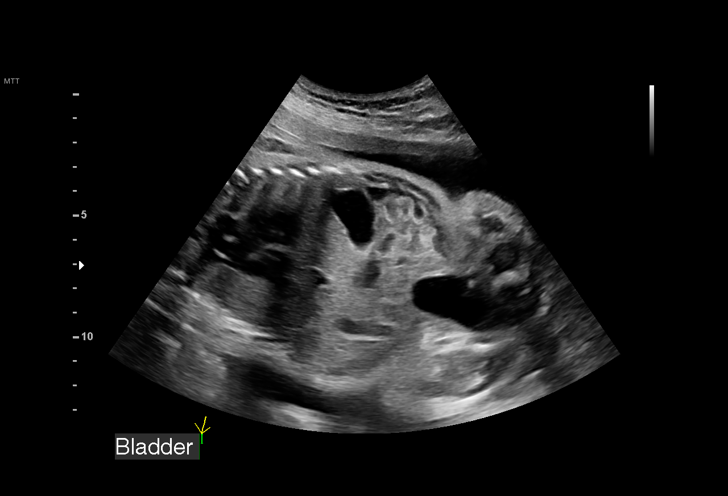
[im 23/68]
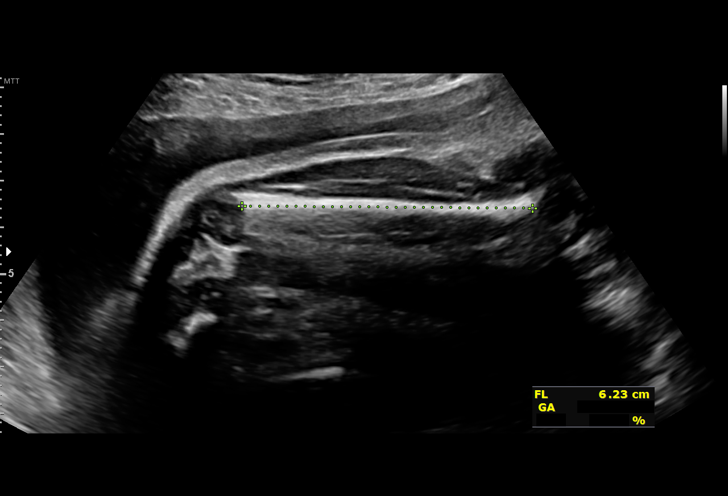
[im 28/68]
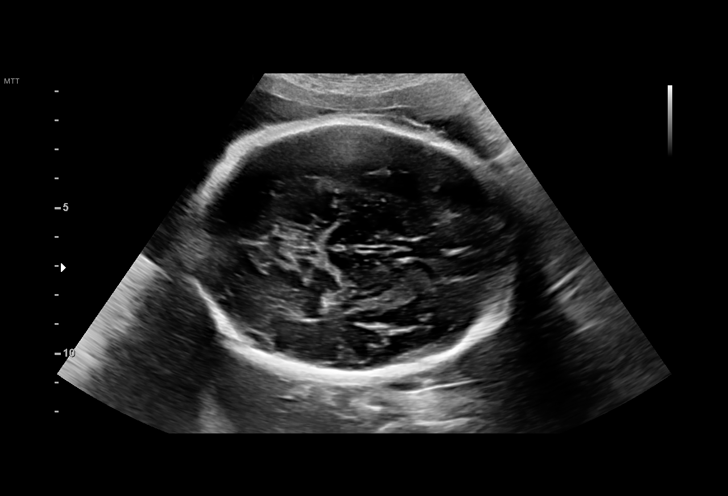
[im 35/68]
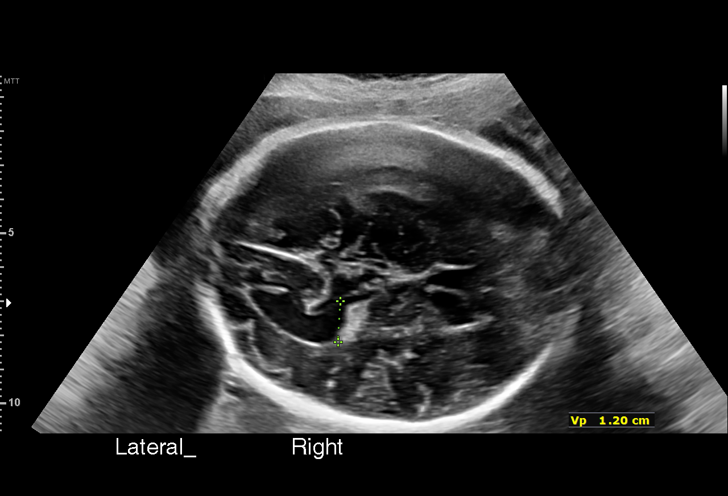
[im 40/68]
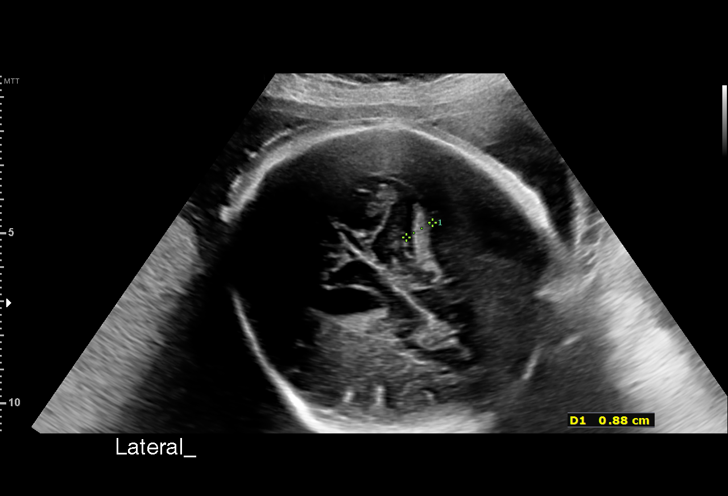
[im 45/68]
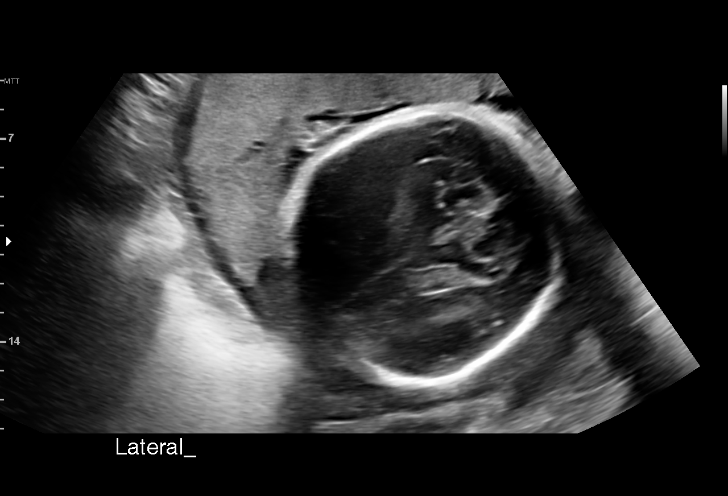
[im 50/68]
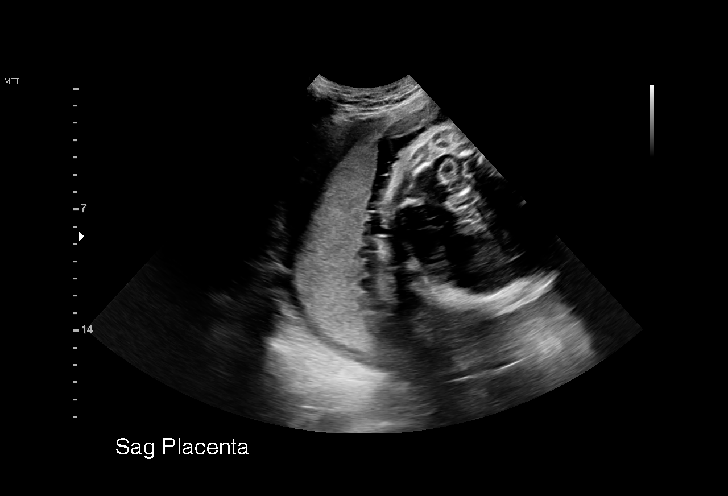
[im 55/68]
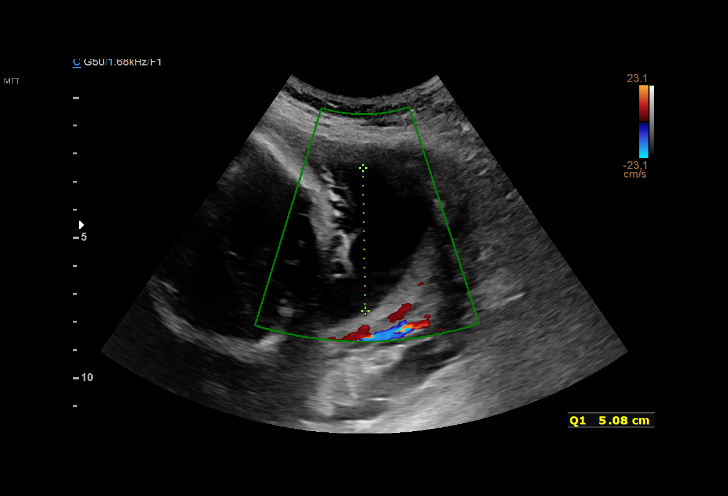
[im 60/68]
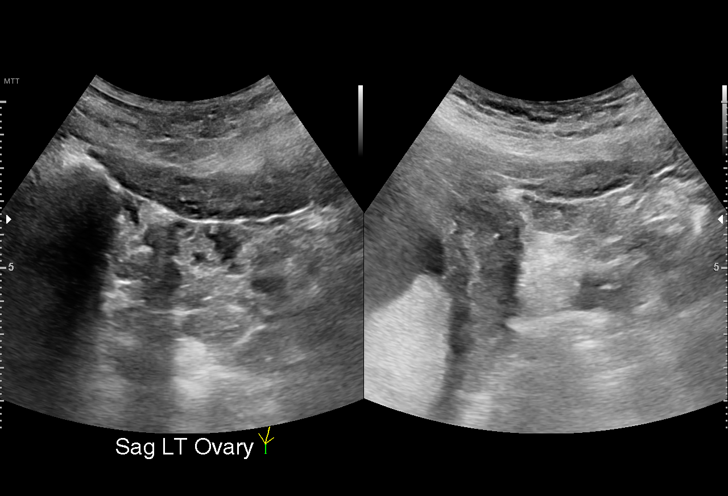
[im 65/68]
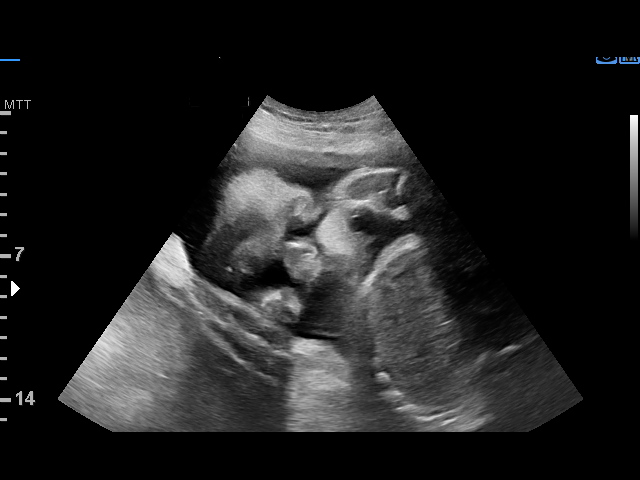

[13 of 28 positions shown; findings below may reference images not displayed]

HERMOD NP

Indications

 Cerebral ventriculomegaly                      [H4]
 32 weeks gestation of pregnancy
 Poor obstetric history: Previous IUFD          [H4]
 (stillbirth)
 Thyroid disease in pregnancy (hypothyroid)     O99.280, [H4]
 Low Risk NIPS(Negative Horizon)
 Encounter for other antenatal screening        [H4]
 follow-up
Fetal Evaluation

 Num Of Fetuses:         1
 Fetal Heart Rate(bpm):  144
 Cardiac Activity:       Observed
 Presentation:           Breech
 Placenta:               Posterior
 P. Cord Insertion:      Previously Visualized

 Amniotic Fluid
 AFI FV:      Subjectively upper-normal

 AFI Sum(cm)     %Tile       Largest Pocket(cm)
 23.8            93          9

 RUQ(cm)       RLQ(cm)       LUQ(cm)        LLQ(cm)
 5.1           9
Biophysical Evaluation

 Amniotic F.V:   Pocket => 2 cm             F. Tone:        Observed
 F. Movement:    Observed                   Score:          [DATE]
 F. Breathing:   Observed
Biometry

 BPD:      85.7  mm     G. Age:  34w 4d         96  %    CI:        72.44   %    70 - 86
                                                         FL/HC:      19.8   %    19.1 -
 HC:      320.3  mm     G. Age:  36w 1d         97  %    HC/AC:      1.08        0.96 -
 AC:      295.5  mm     G. Age:  33w 4d         88  %    FL/BPD:     73.9   %    71 - 87
 FL:       63.3  mm     G. Age:  32w 5d         58  %    FL/AC:      21.4   %    20 - 24
 CER:      44.3  mm     G. Age:  34w 4d         88  %

 LV:       12.3  mm
 CM:          8  mm

 Est. FW:    [H4]  gm    4 lb 15 oz      88  %
OB History

 Gravidity:    4         Term:   2        Prem:   1
 Living:       2
Gestational Age

 LMP:           32w 0d        Date:  [DATE]                 EDD:   [DATE]
 U/S Today:     34w 2d                                        EDD:   [DATE]
 Best:          32w 0d     Det. By:  LMP  ([DATE])          EDD:   [DATE]
Anatomy

 Cranium:               Appears normal         Aortic Arch:            Previously seen
 Cavum:                 Appears normal         Ductal Arch:            Previously seen
 Ventricles:            Ventriculomegaly       Diaphragm:              Appears normal
                        Right 12 mm
 Choroid Plexus:        Previously seen        Stomach:                Appears normal, left
                                                                       sided
 Cerebellum:            Appears normal         Abdomen:                Previously seen
 Posterior Fossa:       Appears normal         Abdominal Wall:         Previously seen
 Nuchal Fold:           Not applicable (>20    Cord Vessels:           Previously seen
                        wks GA)
 Face:                  Orbits and profile     Kidneys:                Appear normal
                        previously seen
 Lips:                  Previously seen        Bladder:                Appears normal
 Thoracic:              Appears normal         Spine:                  Previously seen
 Heart:                 Appears normal         Upper Extremities:      Previously seen
                        (4CH, axis, and
                        situs)
 RVOT:                  Previously seen        Lower Extremities:      Previously seen
 LVOT:                  Previously seen

 Other:  Male gender previously seen. Nasal bones, Lenses, Heels/feet, open
         hands, 3VV previously visualized. Technically difficult due to
         advanced GA and fetal position.
Cervix Uterus Adnexa
 Cervix
 Normal appearance by transabdominal scan.

 Uterus
 No abnormality visualized.

 Right Ovary
 Within normal limits.

 Left Ovary
 Within normal limits.

 Cul De Sac
 No free fluid seen.

 Adnexa
 No abnormality visualized.
Comments

 This patient was seen for a follow up growth scan due to a
 prior IUFD at "8 months".  Unilateral mild ventriculomegaly
 was also noted in the fetal brain on her prior exams.  She
 denies any problems since her last exam and reports feeling
 fetal movements throughout the day.
 She was informed that the fetal growth and amniotic fluid
 level appears appropriate for her gestational age.
 A BPP performed today was [DATE].
 Mild right ventriculomegaly measuring 1.2 cm continues to be
 noted in the fetal brain today.  The left lateral ventricle
 appeared within normal limits.  The patient was reassured
 that most cases of mild unilateral ventriculomegaly are
 associated with good neonatal outcomes.
 Due to her prior IUFD, we will continue to follow her with
 weekly BPP's until delivery.
 The patient was offered and declined an earlier delivery (at
 37 to 38 weeks) to avoid another IUFD.  She would prefer to
 go into spontaneous labor on her own.
 Another BPP was scheduled in 1 week.
 All conversations were held with the patient today with the
 help of HERMOD interpreter.

## 2021-07-26 ENCOUNTER — Other Ambulatory Visit: Payer: Self-pay | Admitting: *Deleted

## 2021-07-26 DIAGNOSIS — O99283 Endocrine, nutritional and metabolic diseases complicating pregnancy, third trimester: Secondary | ICD-10-CM

## 2021-07-26 DIAGNOSIS — E039 Hypothyroidism, unspecified: Secondary | ICD-10-CM

## 2021-07-26 DIAGNOSIS — O3509X0 Maternal care for (suspected) other central nervous system malformation or damage in fetus, not applicable or unspecified: Secondary | ICD-10-CM

## 2021-07-26 DIAGNOSIS — Z8759 Personal history of other complications of pregnancy, childbirth and the puerperium: Secondary | ICD-10-CM

## 2021-08-02 ENCOUNTER — Ambulatory Visit: Payer: Medicaid Other | Admitting: *Deleted

## 2021-08-02 ENCOUNTER — Ambulatory Visit (INDEPENDENT_AMBULATORY_CARE_PROVIDER_SITE_OTHER): Payer: Medicaid Other | Admitting: Family Medicine

## 2021-08-02 ENCOUNTER — Other Ambulatory Visit: Payer: Self-pay

## 2021-08-02 ENCOUNTER — Ambulatory Visit (INDEPENDENT_AMBULATORY_CARE_PROVIDER_SITE_OTHER): Payer: Medicaid Other

## 2021-08-02 VITALS — BP 113/65 | HR 83 | Wt 163.9 lb

## 2021-08-02 DIAGNOSIS — Z8759 Personal history of other complications of pregnancy, childbirth and the puerperium: Secondary | ICD-10-CM

## 2021-08-02 DIAGNOSIS — O409XX0 Polyhydramnios, unspecified trimester, not applicable or unspecified: Secondary | ICD-10-CM | POA: Insufficient documentation

## 2021-08-02 DIAGNOSIS — O99013 Anemia complicating pregnancy, third trimester: Secondary | ICD-10-CM

## 2021-08-02 DIAGNOSIS — O0990 Supervision of high risk pregnancy, unspecified, unspecified trimester: Secondary | ICD-10-CM

## 2021-08-02 DIAGNOSIS — Z789 Other specified health status: Secondary | ICD-10-CM

## 2021-08-02 LAB — CBC
Hematocrit: 28.5 % — ABNORMAL LOW (ref 34.0–46.6)
Hemoglobin: 9.6 g/dL — ABNORMAL LOW (ref 11.1–15.9)
MCH: 24.5 pg — ABNORMAL LOW (ref 26.6–33.0)
MCHC: 33.7 g/dL (ref 31.5–35.7)
MCV: 73 fL — ABNORMAL LOW (ref 79–97)
Platelets: 298 10*3/uL (ref 150–450)
RBC: 3.92 x10E6/uL (ref 3.77–5.28)
RDW: 14.5 % (ref 11.7–15.4)
WBC: 10 10*3/uL (ref 3.4–10.8)

## 2021-08-02 IMAGING — US US FETAL BPP W/ NON-STRESS
1 series · 13 of 14 positions shown · non-contrast
Comparison: none

[Series 1: us fetal bpp w/ non-stress · 14 acquisitions, 13 frames shown]
[im 1/14]
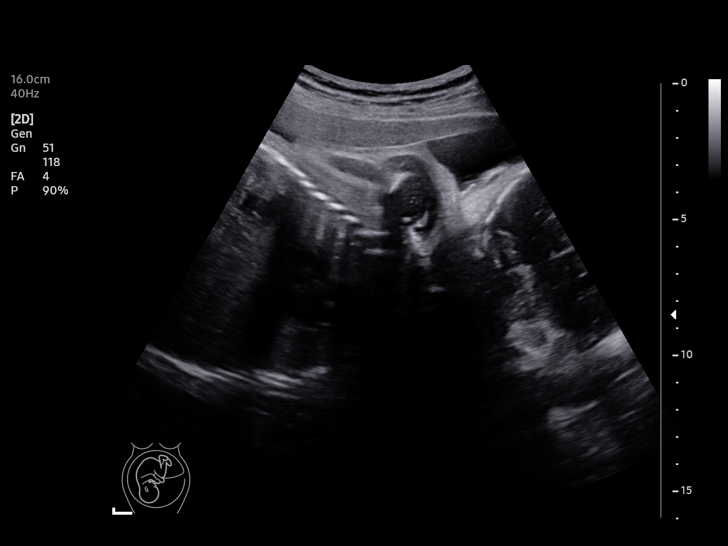
[im 2/14]
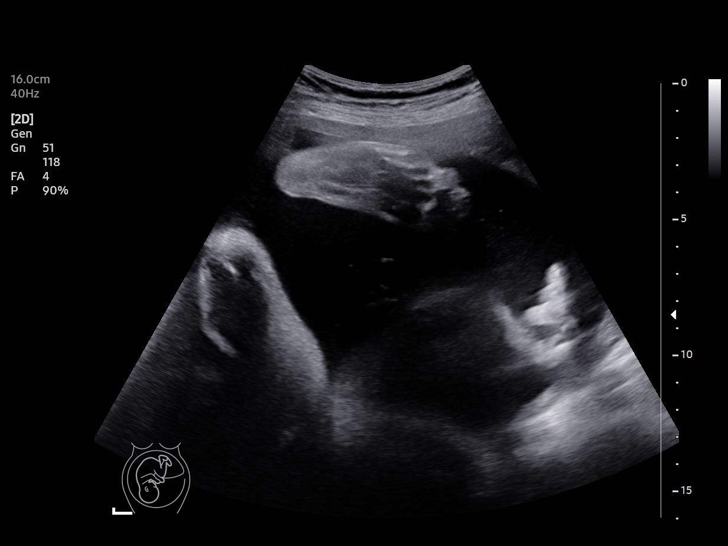
[im 3/14]
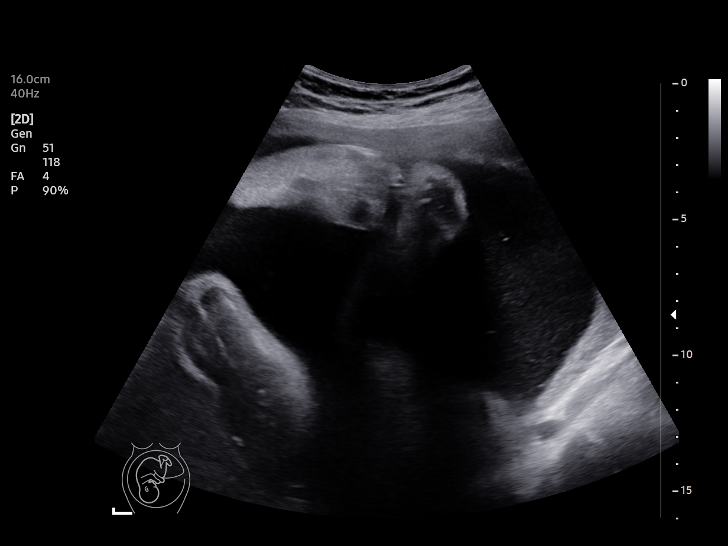
[im 4/14]
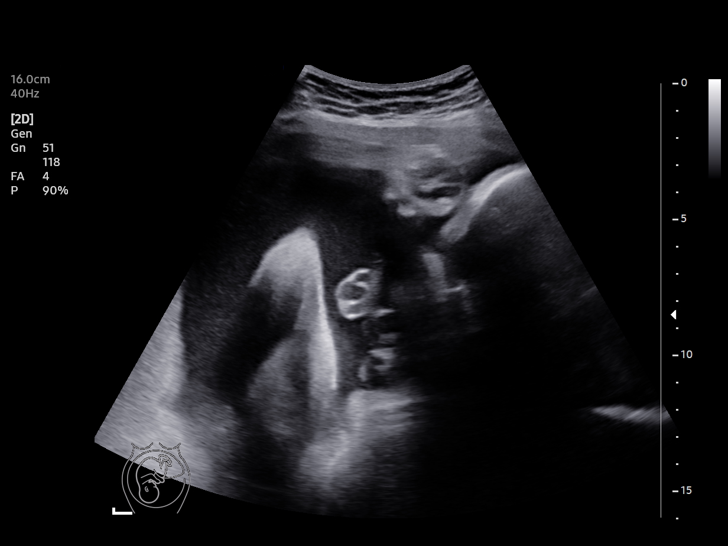
[im 5/14]
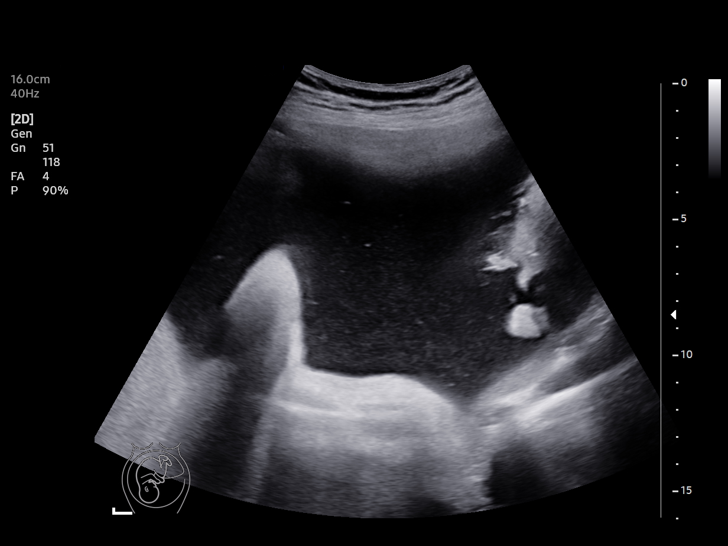
[im 6/14]
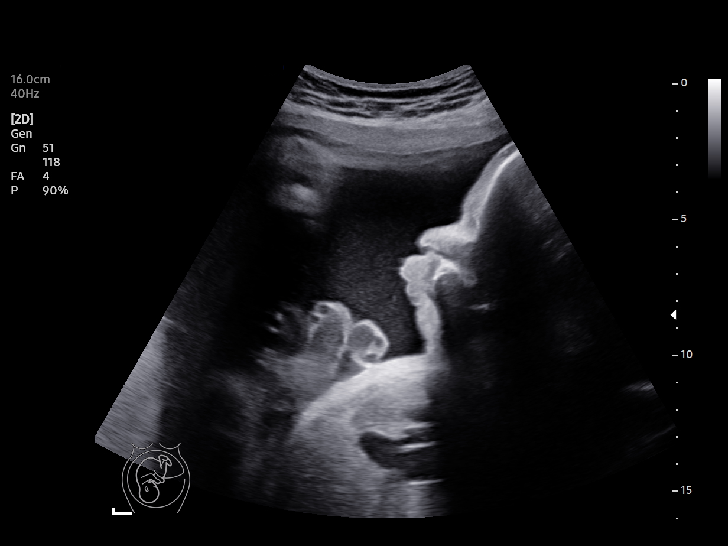
[im 8/14]
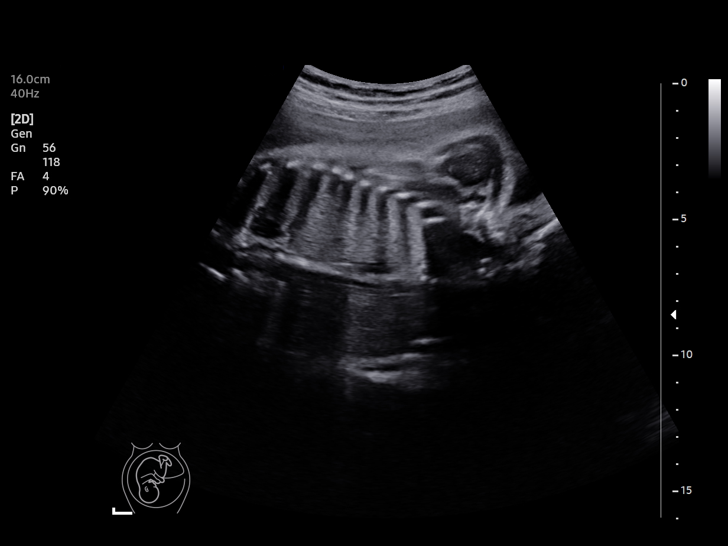
[im 9/14]
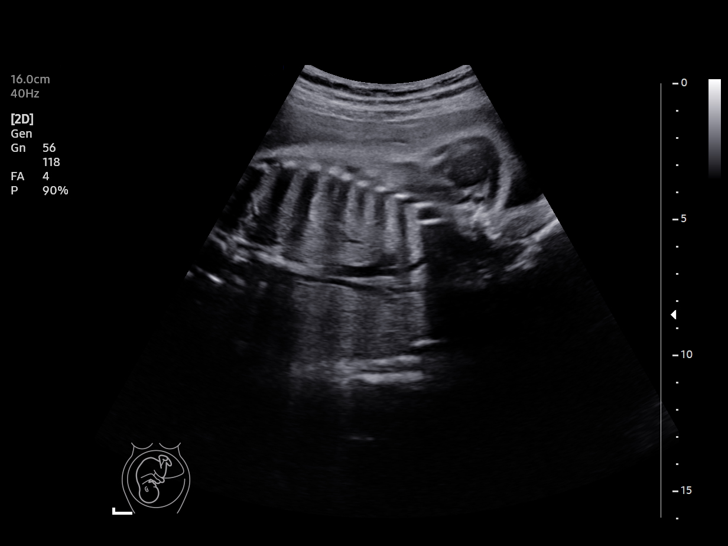
[im 10/14]
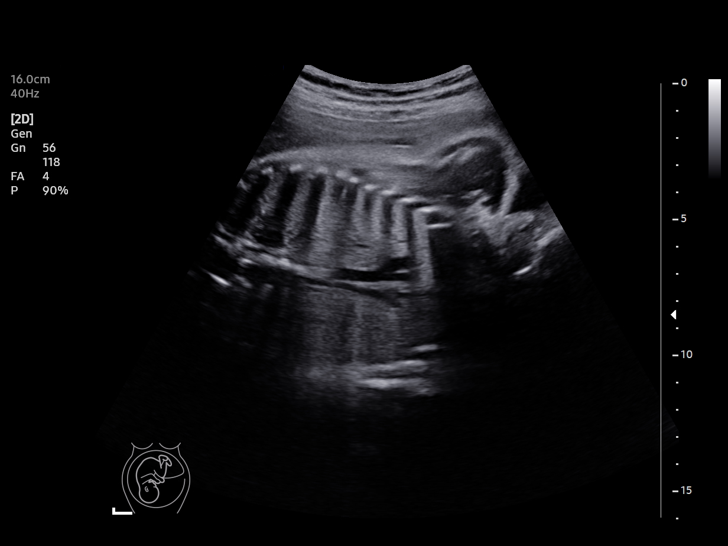
[im 11/14]
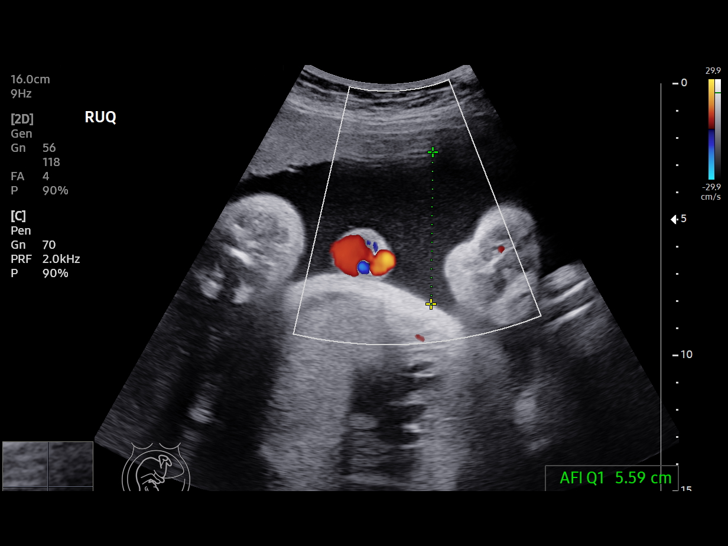
[im 12/14]
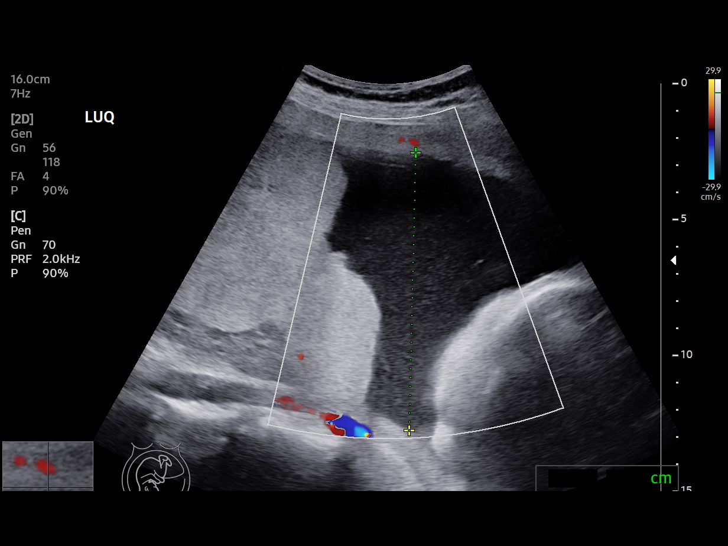
[im 13/14]
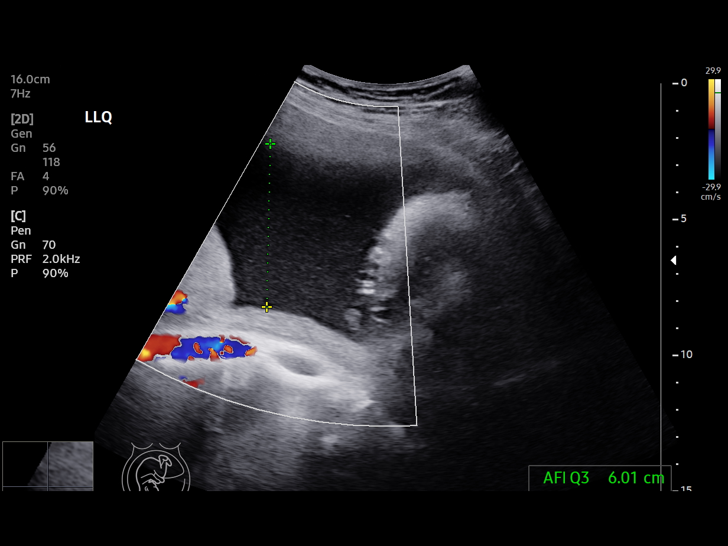
[im 14/14]
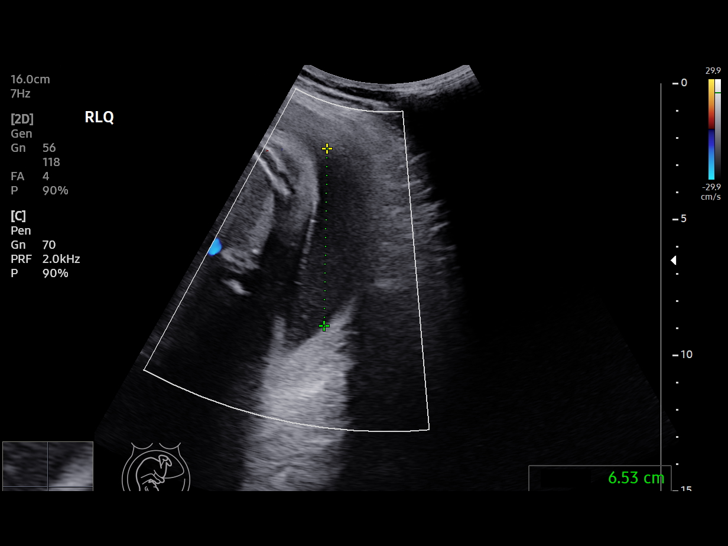

[13 of 14 positions shown; findings below may reference images not displayed]

[REDACTED]care at
                   [facility] NP

 1  US FETAL BPP W/NONSTRESS              76818.4     AMNON

Service(s) Provided

Indications

 33 weeks gestation of pregnancy
 Poor obstetric history: Previous IUFD          [D6]
 (stillbirth)
Fetal Evaluation

 Num Of Fetuses:         1
 Preg. Location:         Intrauterine
 Cardiac Activity:       Observed
 Presentation:           Cephalic

 Amniotic Fluid
 AFI FV:      Polyhydramnios

 AFI Sum(cm)     %Tile       Largest Pocket(cm)
 28.36           > 97

 RUQ(cm)       RLQ(cm)       LUQ(cm)        LLQ(cm)

Biophysical Evaluation

 Amniotic F.V:   Pocket => 2 cm             F. Tone:        Observed
 F. Movement:    Observed                   N.S.T:          Reactive
 F. Breathing:   Observed                   Score:          [DATE]
OB History

 Gravidity:    4         Term:   2        Prem:   1
 Living:       2
Gestational Age

 LMP:           33w 1d        Date:  [DATE]                 EDD:   [DATE]
 Best:          33w 1d     Det. By:  LMP  ([DATE])          EDD:   [DATE]
Impression

 IUP @33 wks
 h/o IUFD
 new onset Polyhydramnios
 BPP [DATE]
Recommendations

 -Continue weekly BPP till delivery.
                  AMNON

## 2021-08-02 NOTE — Patient Instructions (Signed)
AREA PEDIATRIC/FAMILY PRACTICE PHYSICIANS  [place] ([address]) [facility] Chambliss, MD; Eniola, MD; Hale, MD; Hensel, MD; McDiarmid, MD; McIntyer, MD; Neal, MD; Walden, MD [address] [place], [place] [address] [phone] Mon-Fri 8:30-12:30, 1:30-5:00 Providers come to see babies at [facility] Accepting Medicaid [facility] providers who accept newborns: Koirala, MD; Morrow, MD; Wolters, MD [address], [place], [place] [address] [phone] Mon-Fri 8:00-5:30 Babies seen by providers at [facility] Does NOT accept Medicaid Please call early in hospitalization for appointment (limited availability)  [facility] Mulberry, MD [address] [place], [place] [address] [phone] Mon, Tue, Thur, Fri 8:30-5:00, Wed 10:00-7:00 (closed 1-2pm) Babies seen by [facility] providers Accepting Medicaid Rubin - Pediatrician Rubin, MD [address], [place], [place] [address] [phone] Mon-Fri 8:30-5:00, Sat 8:30-12:00 Provider comes to see babies at [facility] Accepting Medicaid Must have been referred from current patients or contacted office prior to delivery [facility] ([facility]) Brown, MD; Chandler, MD; Ettefagh, MD; Grant, MD; Lester, MD; McCormick, MD; McQueen, MD; Prose, MD; Simha, MD; Stanley, MD; Stryffeler, NP; Tebben, NP [address], [place], [place] [address] [phone] Mon, Tue, Thur, Fri 8:30-5:30, Wed 9:30-5:30, Sat 8:30-12:30 Babies seen by [facility] providers Accepting Medicaid Only accepting infants of first-time parents or siblings of current patients Hospital discharge coordinator will make follow-up appointment Jack Amos [address], [place], [place]  [address] [phone]   Fax - [phone] [facility] [address], [place], [place]  [address] Phone - [phone]   Fax - [phone] Shilpa Gosrani [address], [place], [place]  [address] [phone]  [place] ([address]) [facility] Bates, MD; Brassfield, MD; Cooper, Cox, MD; MD; Davis, MD; Dovico, MD; Ettefaugh, MD; Little, MD; Lowe, MD; Keiffer, MD; Melvin, MD; Sumner, MD; Williams, MD [address], [place], [place] [address] [phone] Mon-Fri 8:30-5:00 (extended evenings Mon-Thur as needed), Sat-Sun 10:00-1:00 Providers come to see babies at [facility] Accepting Medicaid for families of first-time babies and families with all children in the household age 3 and under. Must register with office prior to making appointment (M-F only). [facility] Henson, NP; Knapp, MD; Lalonde, MD; Tysinger, PA [address] [place], [place] [address] [phone] Mon-Fri 8:00-5:00 Babies seen by providers at [facility] Does NOT accept Medicaid/Commercial Insurance Only [facility] ([facility])  Artis, MD; Barnes, MD; Bratton, MD; Coccaro, MD; Lockett Gardner, MD; Kramer, MD; Marshall, MD; Netherton, MD; Poleto, MD; Skinner, MD [address] [place], [place] [address] [phone] Mon-Fri 8:30-5:30, Sat (Oct.-Mar.) 9:00-1:00 Babies seen by providers at [facility] Accepting Medicaid  [place] ([address]) [facility] Reid, MD; Warner, MD [address], [place], [place] [address] [phone] Mon-Fri 8:30-5:00, Sat 8:30-12:00 Providers come to see babies at [facility] Does NOT accept Medicaid [facility] Becker, PA; Hagler, MD; Scifres, PA; Sun, MD; Swayne, MD [address], [place], [place] [address] [phone] Mon-Fri 8:00-5:00 Babies seen by providers at [facility] Does NOT accept Medicaid Only accepting babies of parents who  are patients Please call early in hospitalization for appointment (limited availability) [facility] Clark, MD; Frye, MD; Kelleher, MD; Mack, NP; Miller, MD; O'Keller, MD; Patterson, NP; Pudlo, MD; Puzio, MD; Thomas, MD; Tucker, MD; Twiselton, MD [address], [place], [place] [address] [phone] Mon-Fri 8:00-5:00, Sat 9:00-12:00 Providers come to see babies at [facility] Does NOT accept Medicaid  [place] ([address]) [facility] providers accepting new patients: Brake, NP; Wharton, PA [address], [place], [place] [address] [phone] Mon-Fri 8:00-5:00 Babies seen by providers at [facility] Does NOT accept Medicaid Only accepting babies of parents who are patients Please call early in hospitalization for appointment (limited availability) [facility] Gay, MD; Quinlan, MD [address] [place], [place] [address] [phone] (press [phone] to schedule appointment) Mon-Fri 8:00-5:00 Providers come to see babies at [facility] Does NOT accept Medicaid [facility] Mazer, MD [address] [place], [place] [address] [phone] Mon-Fri 8:30-5:00 (lunch 12:30-1:00), extended hours by appointment only Wed 5:00-6:30 Babies seen by [facility] providers Accepting Medicaid [facility] Banks, MD; Jordan, MD; Koberlein, MD [address], [place], [place] [address] [phone] Mon-Fri 8:00-5:00 Babies seen by [facility] providers Does NOT accept Medicaid [facility] Parker, MD; Hunter, MD; Wallace, DO [address] [place], [place] [address] [phone] Mon-Fri 8:00-5:00 Babies seen by [facility] providers Does NOT accept Medicaid [facility] Brandon, PA; Brecken, PA; Christy, NP; Dees, MD; DeClaire, MD; DeWeese, MD; Hansen, NP; Mills, NP; Parrish, NP; Smoot, NP; Summer, MD; Vapne,  MD [address] [place], [place] [address] [phone] Mon-Fri 8:30-5:00, Sat 10:00-1:00 Providers come to see babies at [facility] Does NOT accept Medicaid Free prenatal information session Tuesdays at 4:45pm [facility] Bouska, MD; Gordon, PA; Jeffery, PA; Weber, PA [address] [place] [address] [phone] Mon-Fri 7:30-5:30 Babies seen by [facility] providers [facility] [address], [place], [place]  [address] [phone]   Fax - [phone]  [place] ([address] & [address]) [facility] [place] [address] [place], [place] [address] [phone] Mon-Thur 8:00-6:00 Providers come to see babies at [facility] Accepting Medicaid [facility] Anderson, NP; Badger, MD; Beal, PA; Spencer, PA [address] [place], [place] [address] [phone] Mon-Thur 7:30-7:30, Fri 7:30-4:30 Babies seen by [facility] providers Accepting Medicaid [facility] Agbuya, MD; Klett, NP; Romgoolam, MD [address], [place], [place] [address] [phone] Mon-Fri 8:30-5:00, Sat 8:30-12:00 Providers come to see babies at [facility] Accepting Medicaid Must have "Meet & Greet" appointment at office prior to delivery [facility] ([facility] McCord, MD; Wallace, MD; Wood, MD [address], [place], [place] [address] [phone] Mon-Wed 8:00-6:00, Thur-Fri 8:00-5:00, Sat 9:00-12:00 Providers come to see babies at [facility] Does NOT accept Medicaid Only accepting siblings of current patients [facility]  [address], [place], [place]  [address] [phone]   Fax - [phone] [facility] [address], [place], [place]  [address] [phone]   Fax -  [phone]  [place]  ([address] & [address]) [facility] Cirigliano, DO; Matthews, DO [address] , [place] [address] [phone] Mon-Fri 7:00-5:00 Babies seen by [facility] providers Does NOT accept Medicaid [facility] Briscoe, MD; Howley, PA; Moreira, PA [address], [place], [place] [address] [phone] Mon-Fri 8:00-5:00 Babies seen by [facility] providers Accepting Medicaid [facility] Boyd, MD; Church, PA; Jones, NP; Osborn, PA [address], ,  [address] [phone] Mon-Fri 8:00-5:00 Babies seen by providers at [facility] Accepting Medicaid  [place] ([address]) [facility] [place] Wendling, DO [address] [place], [place] [address] [phone] Mon-Fri 8:00-5:00 Babies seen by [facility] providers Does NOT accept Medicaid Limited availability, please call early in hospitalization to schedule follow-up [facility] Calderon, PA; Cummings, MD; Dillard, MD; Martin, PA; Olson, MD; VanDeven, PA [address], [place], [place] [address] [phone] Mon-Fri 8:30-5:00, Sat 9:00-12:00 Babies seen by providers at [facility] Accepting Medicaid Please register online then schedule online or call office [email or web address] Hunter, NP; Kumar, MD; Martin Rogers, PA [address], [place], [place] [address] [phone] Mon-Fri 8:00-5:00 Babies seen by providers at [facility] Accepting Medicaid [facility] ([facility]) Rincon, MD; Kristi Fleenor, NP; West, MD [address], [place], [place] [address] [phone] Mon-Fri 8:00-5:30, Sat&Sun by appointment (phones open at  8:30) Babies seen by [facility] providers Accepting Medicaid Must be a first-time baby or sibling of current patient [facility] [place]  [address], [place], [place]  [address] [phone]   Fax - [phone]  [place] ([address]) [facility] Brown, PA; Cowen, PA; Rice, MD; Helton, PA; Spry, MD [address] [place], [place] [address] [phone] Mon-Thur 8:00-7:00, Fri 8:00-5:00, Sat 8:00-12:00, Sun 9:00-12:00 Babies seen by [facility] providers Accepting Medicaid [facility] Coe-Goins, MD; Marshall, MD; Pierre-Louis, MD [address] [place] [address], [place], [place] [address] [phone] Mon-Thur 8:00-5:00 Babies seen by providers at [facility] Accepting Medicaid [facility] Bratton, MD; Coe-Goins, MD; Hayes, MD; Lewis, MD; List, MD; Lott, MD; Marshall, MD; Moran, MD; O'Neal, MD; Pierre-Louis, MD; Pitonzo, MD; Scholer, MD; Spangle, MD [address] [place], [place] [address] [phone] Mon-Fri 8:00-5:30, Sat (Oct.-Mar.) 9:00-1:00 Babies seen by providers at [facility] Accepting Medicaid Must fill out new patient packet, available online at [email or web address] [facility] ([facility]) Friddle, NP; Harris, NP; Kelly, NP; Logan, MD; Melvin, PA; Poth, MD; Ramadoss, MD; Stanton, NP [address], [place], [place] [address] [phone] Mon-Thur 8:00-5:30, Fri 8:00-5:00 Babies seen by providers at [facility] Accepting Medicaid  [place] ([address]) [facility] Dixon, PA; Rapides, MD; Pickard, MD; Tapia, PA [address], [place], [place] [address] [phone] Mon-Fri 8:00-5:00 Babies seen by providers at [facility] Accepting Medicaid   [place] ([address]) [facility] [place] Masneri, DO; Meyers, MD; Nelson, PA [address], [place], [place] [address] [phone] Mon-Fri 8:00-5:00 Babies seen by providers at [facility] Does NOT accept Medicaid Limited appointment availability, please call early in hospitalization  [facility] [place] Kunedd, DO; McGowen, MD [address], [place], [place] [address] [phone] Mon-Fri 8:00-5:00 Babies seen by [facility] providers Does NOT accept Medicaid [facility] [place] Cameron, MD; MacDonald, MD; Michaels, PA; Nayak, MD [address] [place] [address], [place],  [address] [phone] Mon-Fri 8:00-5:00 After hours clinic ([address] [place],  [address]) [phone] Mon-Fri 5:00-8:00, Sat 12:00-6:00, Sun 10:00-4:00 Babies seen by [facility] providers Accepting Medicaid [facility] [place] [address], [place], [place]  [address] [phone]   Fax - [phone]  [place] ([address]) [facility] [place] [facility] Andy, MD [address], [place], [place] [address] [phone] Mon-Fri 8:00-5:00 Babies seen by [facility] providers Does NOT accept Medicaid [facility] [place] ([facility] [place]) Eksir, MD [address], [place], [place] [address] [phone] Mon-Thur 8:00-7:00, Fri 8:00-5:00, Sat 8:00-12:00 Babies seen by providers at [facility] Accepting Medicaid - but does not have vaccinations in office (must be received elsewhere) Limited availability, please call early in hospitalization  [place] ([address]) [facility]  Charlene Flemming, MD [address], [place] [address] [phone]  Fax [phone]  [facility]  Kaden Dunkel, MD, Annamarie Streilein, PA, Carla Hampton, PA [address] [place] [place], [place]  [address] [phone] [facility]  [address], [place], [place] [address] [phone] [address], [place], [place] [address] [phone] (West Office)  [facility] [address], [place], [place] [address] [phone] [facility] [address] [place], [place], [place] [address] [phone] [facility] [address], [place], [place] [address] [phone] [facility] [address], [place], [place] [address] [phone] [facility] [address], [place], [place] [address] [phone] [facility] [address], [place], [place] [address] [phone] [facility]  [address], [place], [place] [address] [phone] Dr[facility] [address] [place], [place], [place] [address] [phone] [facility] [address], [place], [place] [address] [phone] [facility] [address], [place],  [address] [phone]  

## 2021-08-02 NOTE — Progress Notes (Signed)
   PRENATAL VISIT NOTE  Subjective:  Jessica Robbins is a 20 y.o. [id number] at [redacted]w[redacted]d being seen today for ongoing prenatal care.  She is currently monitored for the following issues for this high-risk pregnancy and has Supervision of high risk pregnancy, antepartum; History of IUFD; Language barrier; Hypothyroidism; Anemia affecting pregnancy in third trimester; and Polyhydramnios affecting pregnancy on their problem list.  Patient reports no complaints.  Contractions: Irregular. Vag. Bleeding: None.  Movement: Present. Denies leaking of fluid.   The following portions of the patient's history were reviewed and updated as appropriate: allergies, current medications, past family history, past medical history, past social history, past surgical history and problem list.   Objective:   Vitals:   08/02/21 1014  BP: 113/65  Pulse: 83  Weight: 163 lb 14.4 oz (74.3 kg)    Fetal Status: Fetal Heart Rate (bpm): NST Fundal Height: 36 cm Movement: Present     General:  Alert, oriented and cooperative. Patient is in no acute distress.  Skin: Skin is warm and dry. No rash noted.   Cardiovascular: Normal heart rate noted  Respiratory: Normal respiratory effort, no problems with respiration noted  Abdomen: Soft, gravid, appropriate for gestational age.  Pain/Pressure: Present     Pelvic: Cervical exam deferred        Extremities: Normal range of motion.  Edema: None  Mental Status: Normal mood and affect. Normal behavior. Normal judgment and thought content.   Assessment and Plan:  Pregnancy: [id number] at [redacted]w[redacted]d 1. Supervision of high risk pregnancy, antepartum Up to date Plans to take baby to [facility]. Other two children go to this facility - CBC  2. History of IUFD In testing for IUFD and poly  3. Language barrier Has [name]  4. Anemia affecting pregnancy in third trimester Last was 9.7, recheck today - CBC  5. Polyhydramnios - AFI 28 today - has MFM FU  6.  Hypothyroidism - last TSH in Oct (3rd trimester) was WNL and patient not on synthroid currently Lab Results  Component Value Date   TSH 3.300 06/27/2021   TSH 3.370 04/04/2021   TSH 4.230 03/06/2021   TSH 6.710 (H) 12/14/2020   TSH 19.100 (H) 10/19/2020  -Recommend postpartum TSH  Preterm labor symptoms and general obstetric precautions including but not limited to vaginal bleeding, contractions, leaking of fluid and fetal movement were reviewed in detail with the patient. Please refer to After Visit Summary for other counseling recommendations.   Return in about 1 week (around 08/09/2021) for 11/23 &12/1 Appts as scheduled.  Future Appointments  Date Time Provider Department Center  08/09/2021  9:15 AM [facility] NST [facility]  08/17/2021  1:15 PM [facility] NST [facility]  08/17/2021  2:15 PM Anyanwu, Jethro Bastos, MD [facility]  08/22/2021 12:45 PM [facility] NURSE [facility]  08/22/2021  1:00 PM [facility] US1 [facility]    Federico Flake, MD

## 2021-08-02 NOTE — Progress Notes (Signed)
Patient seen and assessed by nursing staff.  Agree with documentation and plan.  NST:  Baseline: 135 bpm, Variability: Good {> 6 bpm), Accelerations: Reactive, and Decelerations: Absent   

## 2021-08-03 ENCOUNTER — Other Ambulatory Visit: Payer: Self-pay | Admitting: Obstetrics and Gynecology

## 2021-08-03 DIAGNOSIS — O99013 Anemia complicating pregnancy, third trimester: Secondary | ICD-10-CM

## 2021-08-04 ENCOUNTER — Other Ambulatory Visit: Payer: Self-pay

## 2021-08-04 ENCOUNTER — Telehealth: Payer: Self-pay

## 2021-08-04 DIAGNOSIS — O99013 Anemia complicating pregnancy, third trimester: Secondary | ICD-10-CM

## 2021-08-04 NOTE — Progress Notes (Signed)
Opened in error  [place], RN  08/04/21

## 2021-08-04 NOTE — Telephone Encounter (Signed)
Notified pt's sponsor that the provider needs to draw more labs.  I informed her that we can draw them at her next appt scheduled on 08/09/21.  Pt's sponsor states that it would be better if they could do it at the appt.  I verbalized understanding and stated that she will get her labs drawn on 08/09/21 at her NST/BPP appt.    Addison Naegeli, RN 08/04/21

## 2021-08-09 ENCOUNTER — Ambulatory Visit (INDEPENDENT_AMBULATORY_CARE_PROVIDER_SITE_OTHER): Payer: Medicaid Other

## 2021-08-09 ENCOUNTER — Other Ambulatory Visit: Payer: Self-pay

## 2021-08-09 ENCOUNTER — Ambulatory Visit: Payer: Medicaid Other | Admitting: *Deleted

## 2021-08-09 DIAGNOSIS — O409XX0 Polyhydramnios, unspecified trimester, not applicable or unspecified: Secondary | ICD-10-CM

## 2021-08-09 DIAGNOSIS — Z8759 Personal history of other complications of pregnancy, childbirth and the puerperium: Secondary | ICD-10-CM

## 2021-08-09 DIAGNOSIS — O99013 Anemia complicating pregnancy, third trimester: Secondary | ICD-10-CM

## 2021-08-09 IMAGING — US US FETAL BPP W/ NON-STRESS
1 series · 10 of 10 positions shown · non-contrast
Comparison: none

[Series 1: us fetal bpp w/ non-stress · 10 acquisitions, 10 frames shown]
[im 1/10]
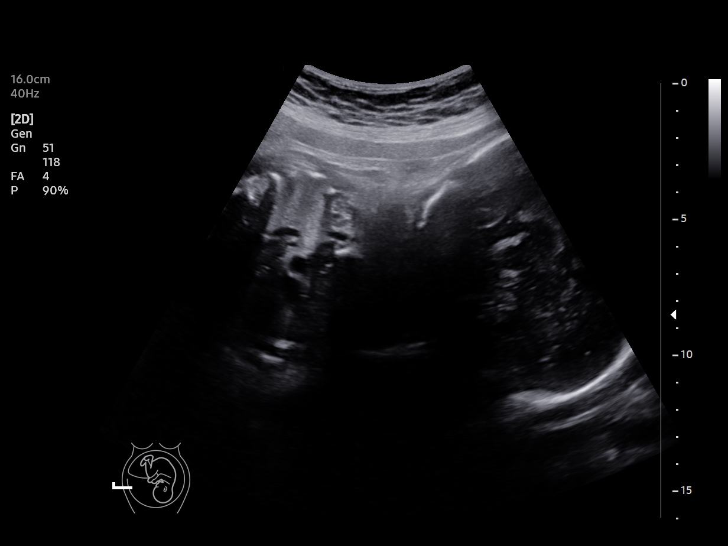
[im 2/10]
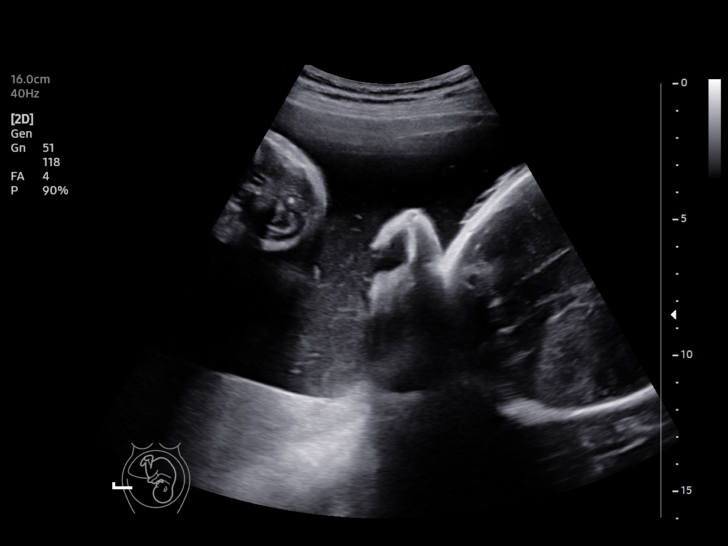
[im 3/10]
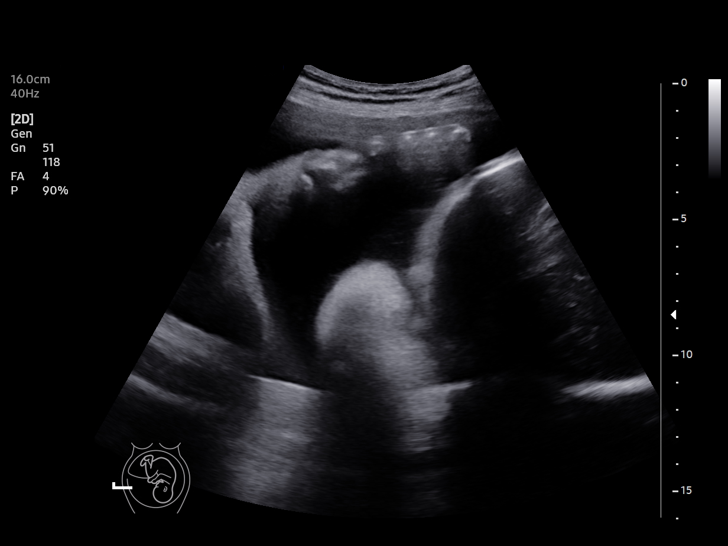
[im 4/10]
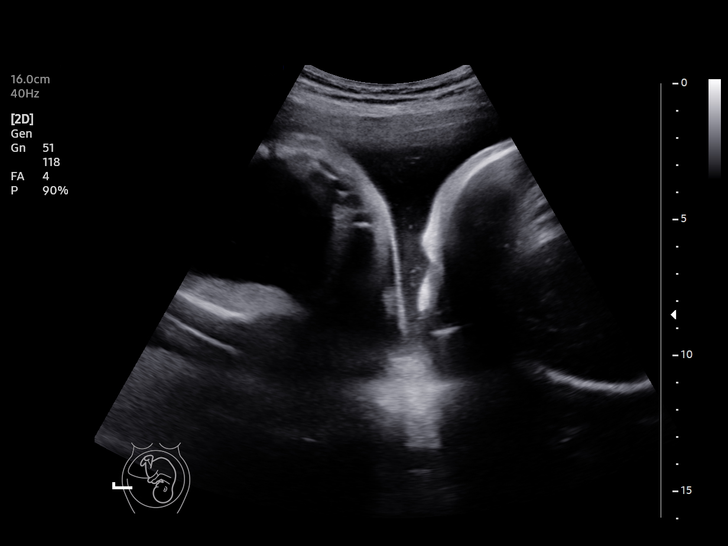
[im 5/10]
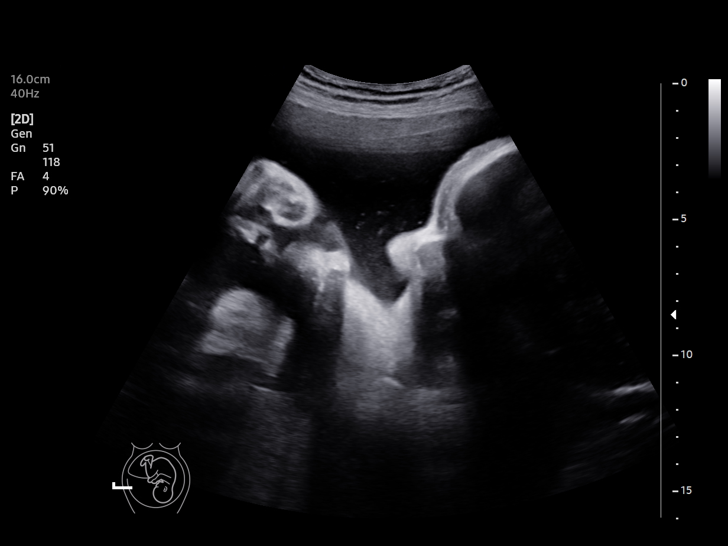
[im 6/10]
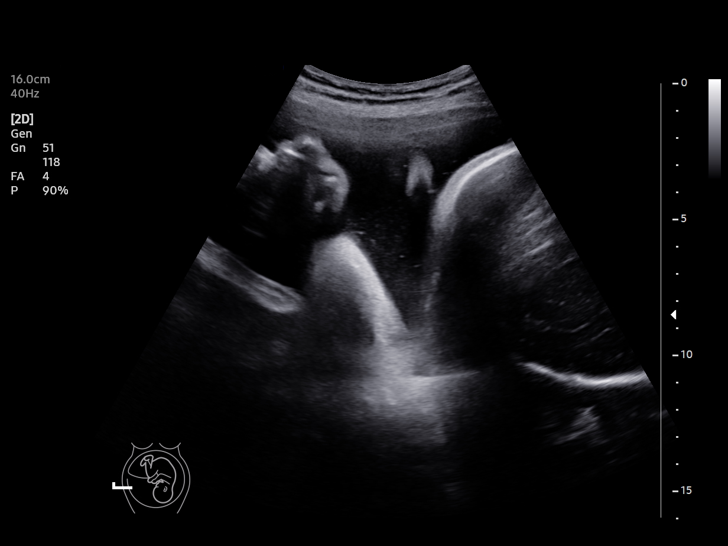
[im 7/10]
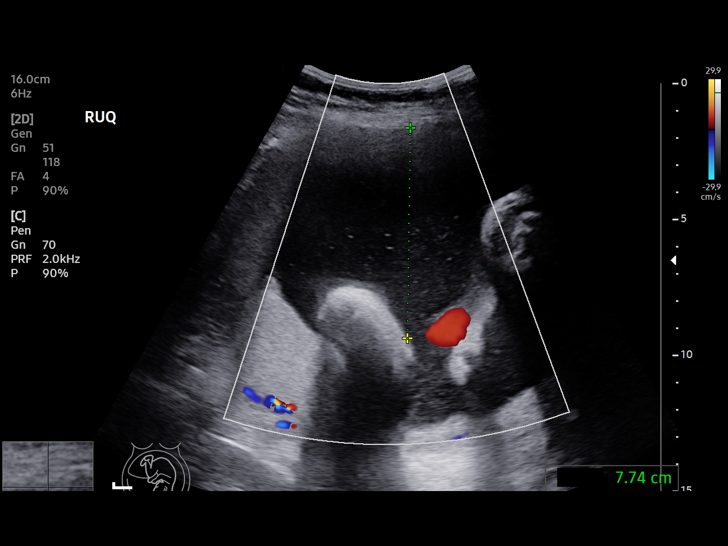
[im 8/10]
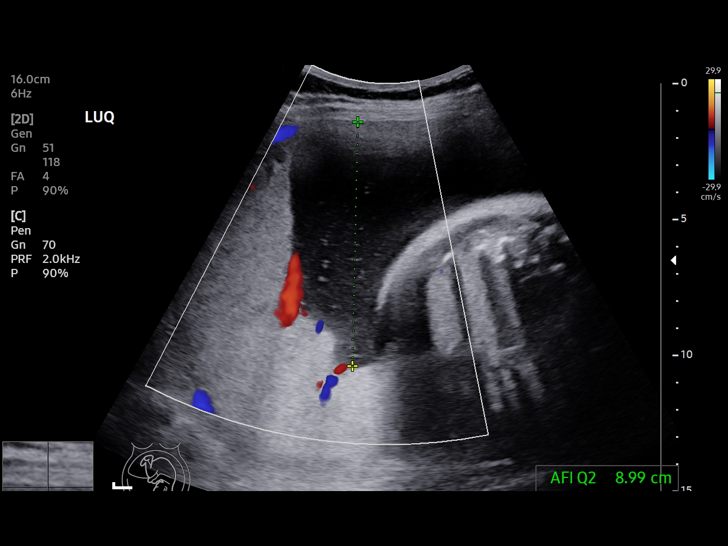
[im 9/10]
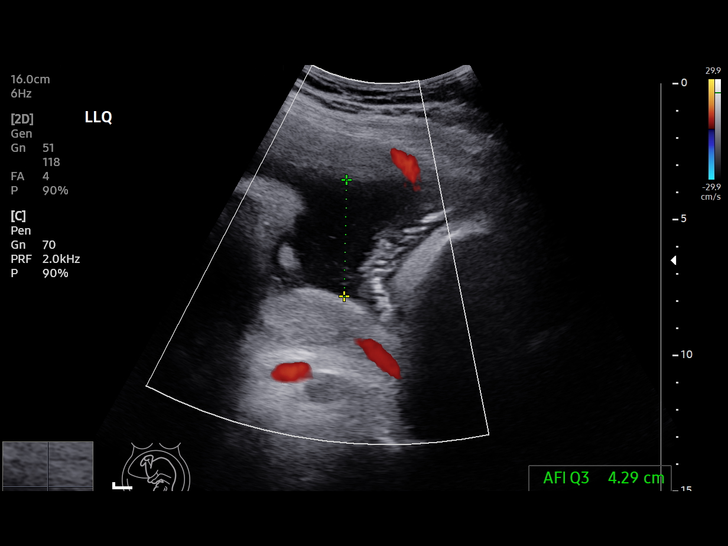
[im 10/10]
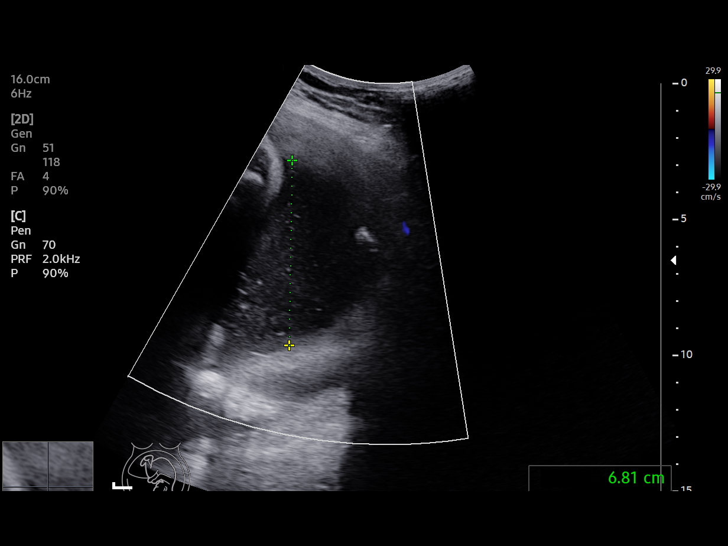

[10 of 10 positions shown; findings below may reference images not displayed]

Attending:        JIANG         Ref. Address:     Faculty Practice
                                                            [REDACTED]care at
                   JIANG NP

 1  US FETAL BPP W/NONSTRESS              [address].4     JIANG

Service(s) Provided

Indications

 34 weeks gestation of pregnancy
 Poor obstetric history: Previous IUFD          [PX]
 (stillbirth)
 Polyhydramnios, third trimester, antepartum    [PX]
 condition or complication, unspecified fetus
Fetal Evaluation

 Num Of Fetuses:         1
 Preg. Location:         Intrauterine
 Cardiac Activity:       Observed
 Presentation:           Cephalic

 Amniotic Fluid
 AFI FV:      Polyhydramnios

 AFI Sum(cm)     %Tile       Largest Pocket(cm)
 27.83           > 97

 RUQ(cm)       RLQ(cm)       LUQ(cm)        LLQ(cm)

Biophysical Evaluation
 Amniotic F.V:   Pocket => 2 cm             F. Tone:        Observed
 F. Movement:    Observed                   N.S.T:          Reactive
 F. Breathing:   Not Observed               Score:          [DATE]
OB History

 Gravidity:    4         Term:   2        Prem:   1
 Living:       2
Gestational Age

 LMP:           34w 1d        Date:  [DATE]                 EDD:   [DATE]
 Best:          34w 1d     Det. By:  LMP  ([DATE])          EDD:   [DATE]
Impression

 Antenatal testing is reassuring with BPP [DATE]. Mild
 polyhydramnios noted.
Recommendations

 Continue weekly antenatal testing and amniotic fluid
 evaluation till delivery
              JIANG

## 2021-08-09 NOTE — Progress Notes (Signed)
Pt informed that the ultrasound is considered a limited OB ultrasound and is not intended to be a complete ultrasound exam.  Patient also informed that the ultrasound is not being completed with the intent of assessing for fetal or placental anomalies or any pelvic abnormalities.  Explained that the purpose of today's ultrasound is to assess for presentation, BPP and amniotic fluid volume.  Patient acknowledges the purpose of the exam and the limitations of the study.    Anemia labs done today per order Dr. Alvester Morin

## 2021-08-10 LAB — IRON AND TIBC
Iron Saturation: 5 % — CL (ref 15–55)
Iron: 28 ug/dL (ref 27–159)
Total Iron Binding Capacity: 607 ug/dL (ref 250–450)
UIBC: 579 ug/dL — ABNORMAL HIGH (ref 131–425)

## 2021-08-10 LAB — FERRITIN: Ferritin: 7 ng/mL — ABNORMAL LOW (ref 15–150)

## 2021-08-10 LAB — FOLATE: Folate: 2.1 ng/mL — ABNORMAL LOW (ref >3.0)

## 2021-08-10 LAB — VITAMIN B12: Vitamin B-12: 90 pg/mL — ABNORMAL LOW (ref 232–1245)

## 2021-08-14 ENCOUNTER — Telehealth: Payer: Self-pay

## 2021-08-14 NOTE — Telephone Encounter (Signed)
Call placed to pt with [facility] # [phone]. Spoke with pt. Pt given results per Dr Alvester Morin. Pt verbalized understanding and agreeable to plan of care.  Tried calling to schedule Iron Infusion, no answer at this time. Will try again tomorrow to schedule.  Pt prefers Thursday and Fridays.  Will send Dr Alvester Morin message to place iron infusion orders.  Laney Pastor

## 2021-08-14 NOTE — Telephone Encounter (Signed)
-----   Message from Federico Flake, MD sent at 08/10/2021  8:48 AM EST ----- Recommend fe infusion for low ferritin and hgb. Please call patient if desired I will put in orders Patient also needs b12 and folate supplementation. Please send in high dose folic acid 1g daily and can we arrange a B12 injection at [facility]?

## 2021-08-15 ENCOUNTER — Other Ambulatory Visit: Payer: Self-pay | Admitting: Family Medicine

## 2021-08-15 DIAGNOSIS — E538 Deficiency of other specified B group vitamins: Secondary | ICD-10-CM

## 2021-08-15 DIAGNOSIS — O99013 Anemia complicating pregnancy, third trimester: Secondary | ICD-10-CM

## 2021-08-15 MED ORDER — FOLIC ACID 1 MG PO TABS: 1.0000 mg | ORAL_TABLET | Freq: Every day | ORAL | 10 refills | Status: DC

## 2021-08-15 MED ORDER — CYANOCOBALAMIN 1000 MCG/ML IJ SOLN: 1000.0000 ug | Freq: Once | INTRAMUSCULAR | 0 refills | Status: AC

## 2021-08-15 NOTE — Telephone Encounter (Signed)
Spoke with pt's sponsor-Elizabeth. Given iron infusion appt for this pt that is scheduled for 12/1 at 9am. Agreeable to date and time of appt.   Judeth Cornfield, RN

## 2021-08-17 ENCOUNTER — Ambulatory Visit: Payer: Medicaid Other | Admitting: *Deleted

## 2021-08-17 ENCOUNTER — Encounter: Payer: Self-pay | Admitting: Obstetrics & Gynecology

## 2021-08-17 ENCOUNTER — Ambulatory Visit (INDEPENDENT_AMBULATORY_CARE_PROVIDER_SITE_OTHER): Payer: Medicaid Other

## 2021-08-17 ENCOUNTER — Ambulatory Visit (INDEPENDENT_AMBULATORY_CARE_PROVIDER_SITE_OTHER): Payer: Medicaid Other | Admitting: Obstetrics & Gynecology

## 2021-08-17 ENCOUNTER — Other Ambulatory Visit: Payer: Self-pay

## 2021-08-17 ENCOUNTER — Ambulatory Visit (HOSPITAL_COMMUNITY)
Admission: RE | Admit: 2021-08-17 | Discharge: 2021-08-17 | Disposition: A | Discharge status: Home / Self Care | Payer: Medicaid Other | Source: Ambulatory Visit | Attending: Obstetrics & Gynecology | Admitting: Obstetrics & Gynecology

## 2021-08-17 VITALS — BP 123/62 | HR 69 | Wt 166.8 lb

## 2021-08-17 DIAGNOSIS — O409XX0 Polyhydramnios, unspecified trimester, not applicable or unspecified: Secondary | ICD-10-CM

## 2021-08-17 DIAGNOSIS — Z3A00 Weeks of gestation of pregnancy not specified: Secondary | ICD-10-CM | POA: Diagnosis not present

## 2021-08-17 DIAGNOSIS — O99019 Anemia complicating pregnancy, unspecified trimester: Secondary | ICD-10-CM | POA: Diagnosis present

## 2021-08-17 DIAGNOSIS — Z3A35 35 weeks gestation of pregnancy: Secondary | ICD-10-CM | POA: Diagnosis not present

## 2021-08-17 DIAGNOSIS — D509 Iron deficiency anemia, unspecified: Secondary | ICD-10-CM

## 2021-08-17 DIAGNOSIS — D649 Anemia, unspecified: Secondary | ICD-10-CM | POA: Insufficient documentation

## 2021-08-17 DIAGNOSIS — O0990 Supervision of high risk pregnancy, unspecified, unspecified trimester: Secondary | ICD-10-CM | POA: Diagnosis not present

## 2021-08-17 DIAGNOSIS — O99013 Anemia complicating pregnancy, third trimester: Secondary | ICD-10-CM

## 2021-08-17 DIAGNOSIS — Z8759 Personal history of other complications of pregnancy, childbirth and the puerperium: Secondary | ICD-10-CM | POA: Diagnosis not present

## 2021-08-17 IMAGING — US US FETAL BPP W/ NON-STRESS
1 series · 13 of 14 positions shown · non-contrast
Comparison: none

[Series 1: us fetal bpp w/ non-stress · 14 acquisitions, 13 frames shown]
[im 1/14]
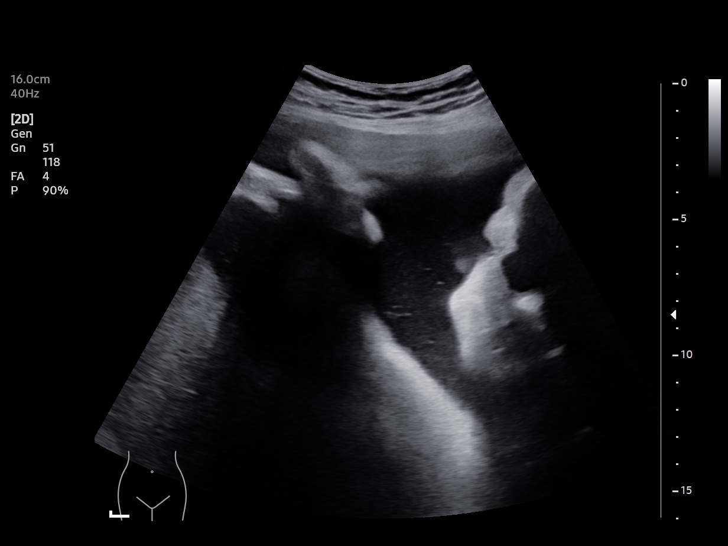
[im 2/14]
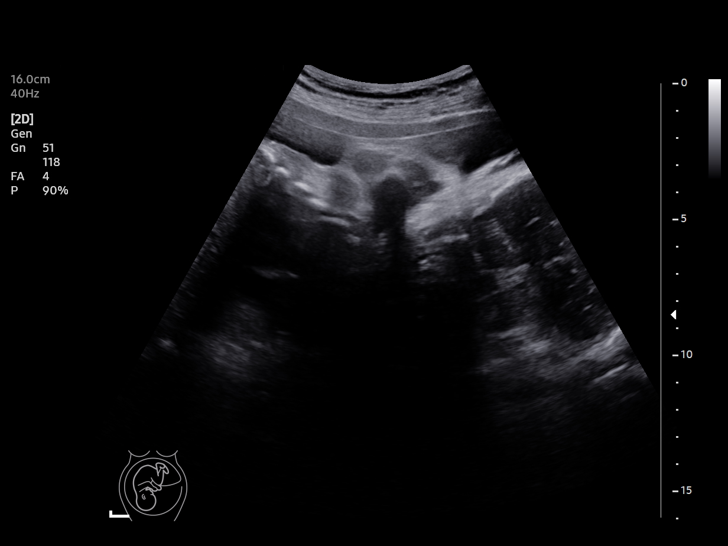
[im 3/14]
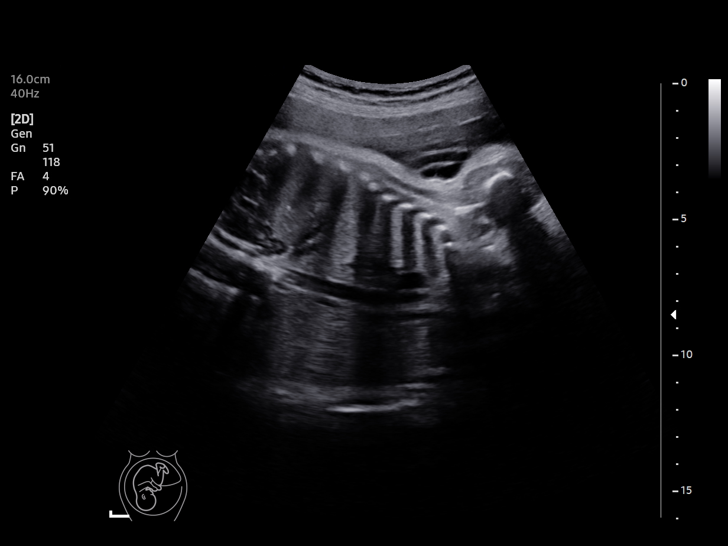
[im 4/14]
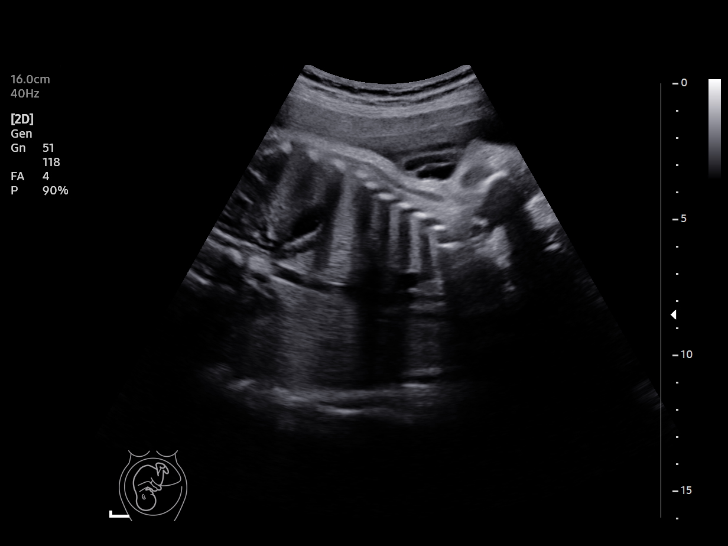
[im 5/14]
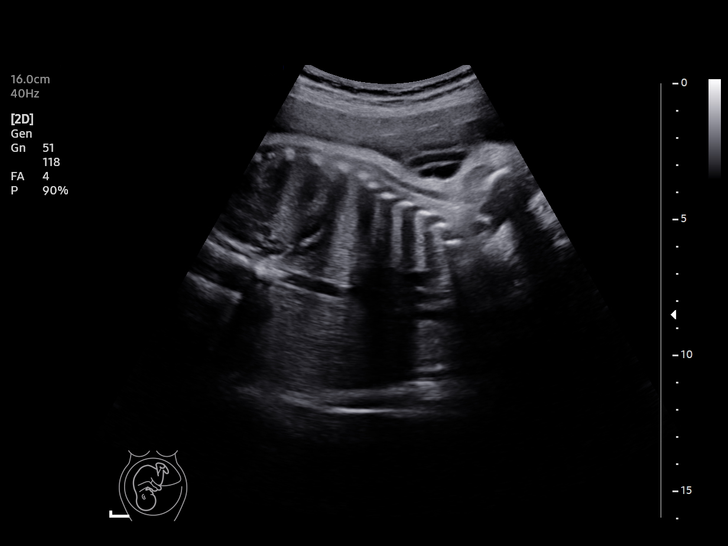
[im 6/14]
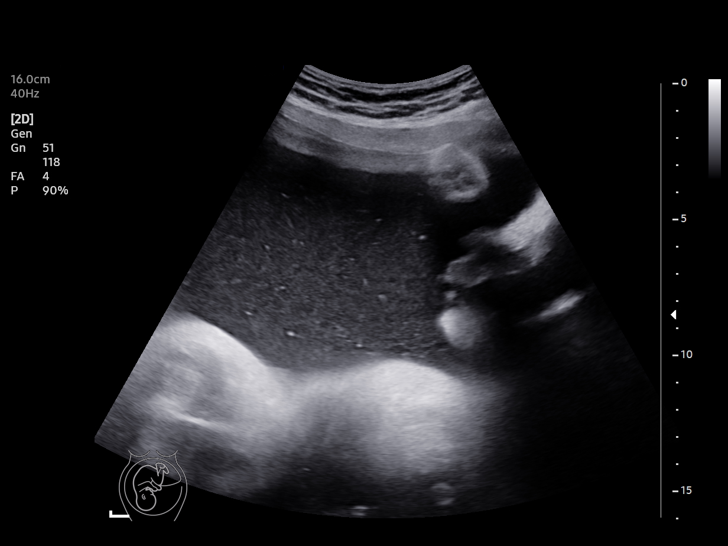
[im 8/14]
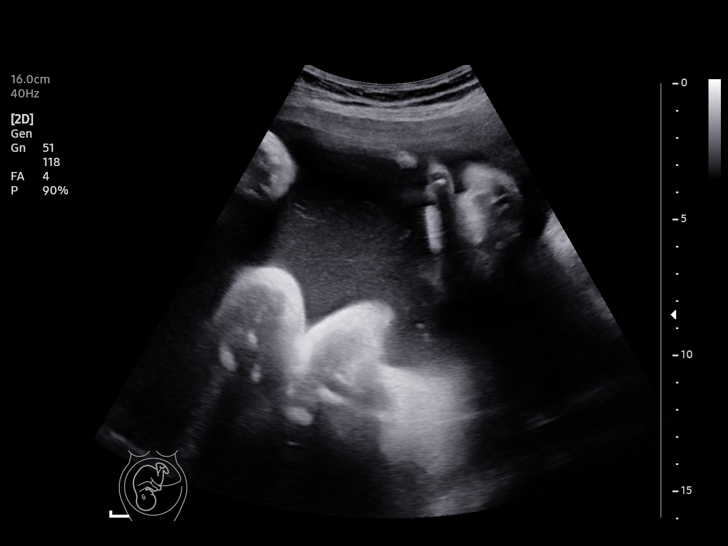
[im 9/14]
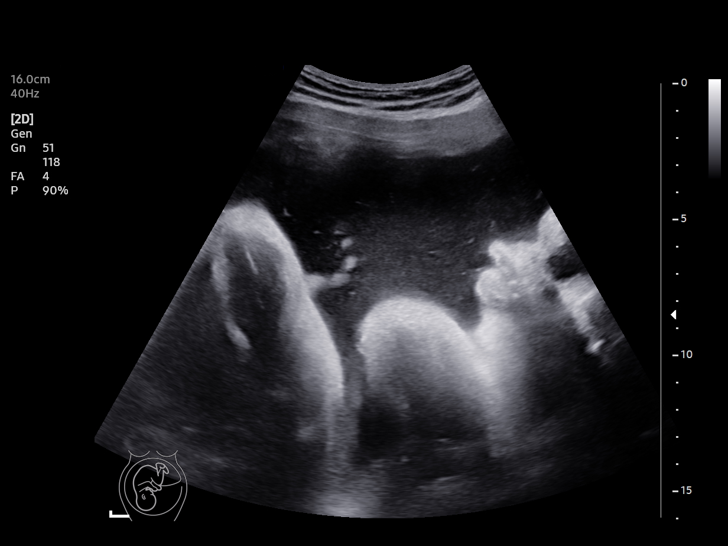
[im 10/14]
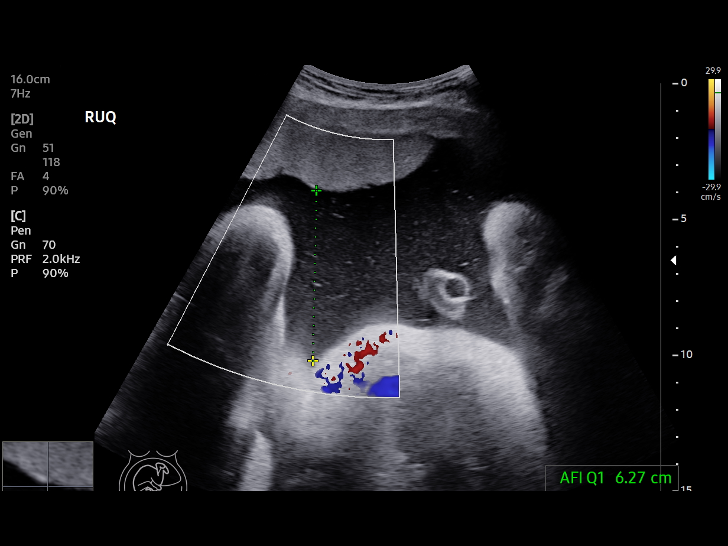
[im 11/14]
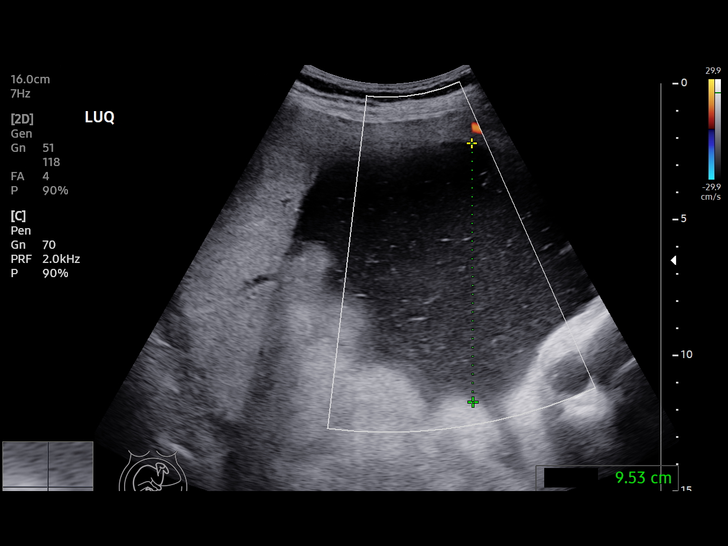
[im 12/14]
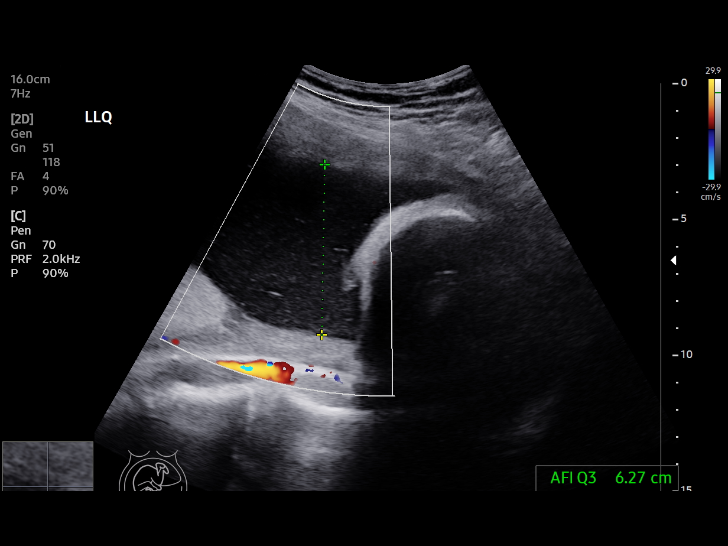
[im 13/14]
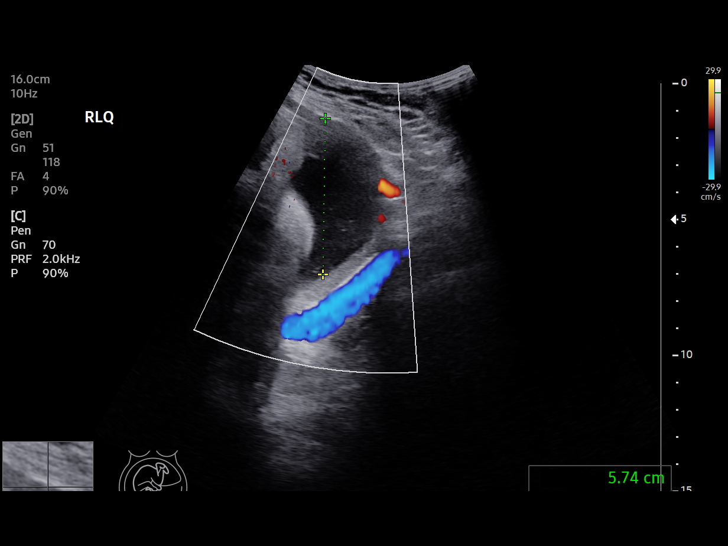
[im 14/14]
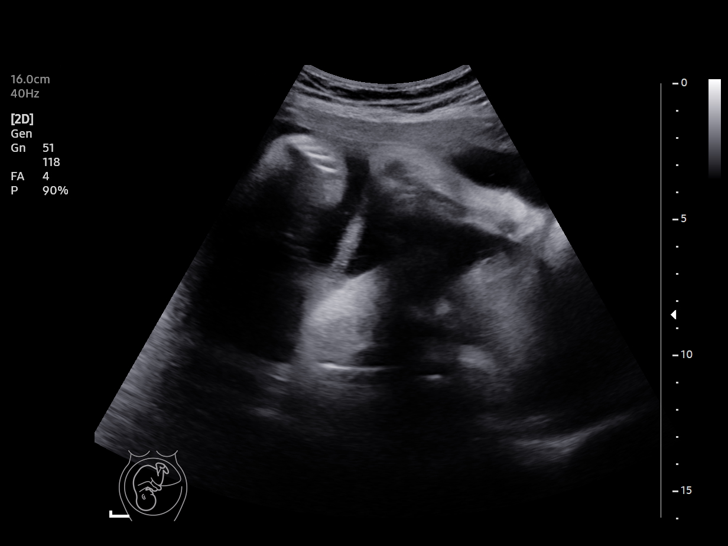

[13 of 14 positions shown; findings below may reference images not displayed]

Attending:        DE KNOP         Ref. Address:     Faculty Practice
                                                            [REDACTED]care at
                   DE KNOP NP

 1  US FETAL BPP W/NONSTRESS              [address].4     DE KNOP

Service(s) Provided

Indications

 35 weeks gestation of pregnancy
 Poor obstetric history: Previous IUFD          [0I]
 (stillbirth)
 Polyhydramnios, third trimester, antepartum    [0I]
 condition or complication, unspecified fetus
Fetal Evaluation

 Num Of Fetuses:         1
 Preg. Location:         Intrauterine
 Cardiac Activity:       Observed
 Presentation:           Cephalic

 Amniotic Fluid
 AFI FV:      Polyhydramnios

 AFI Sum(cm)     %Tile       Largest Pocket(cm)
 27.81           97

 RUQ(cm)       RLQ(cm)       LUQ(cm)        LLQ(cm)

Biophysical Evaluation
 Amniotic F.V:   Pocket => 2 cm             F. Tone:        Observed
 F. Movement:    Observed                   N.S.T:          Reactive
 F. Breathing:   Observed                   Score:          [DATE]
OB History

 Gravidity:    4         Term:   2        Prem:   1
 Living:       2
Gestational Age

 LMP:           35w 2d        Date:  [DATE]                 EDD:   [DATE]
 Best:          35w 2d     Det. By:  LMP  ([DATE])          EDD:   [DATE]
Impression

 Antenatal testing is reassuring with BPP [DATE]. Mild
 polyhydramnios.
Recommendations

 Continue weekly antenatal testing till delivery .
              DE KNOP

## 2021-08-17 MED ORDER — CYANOCOBALAMIN 1000 MCG/ML IJ SOLN
1000.0000 ug | Freq: Once | INTRAMUSCULAR | Status: AC
Start: 2021-08-17 — End: 2021-08-17
  Administered 2021-08-17: 1000 ug via INTRAMUSCULAR

## 2021-08-17 MED ORDER — SODIUM CHLORIDE 0.9 % IV SOLN
300.0000 mg | INTRAVENOUS | Status: DC
  Administered 2021-08-17: 300 mg via INTRAVENOUS
  Filled 2021-08-17: qty 300

## 2021-08-17 NOTE — Progress Notes (Signed)
   PRENATAL VISIT NOTE  Subjective:  Jessica Robbins is a 20 y.o. [phone] at [redacted]w[redacted]d being seen today for ongoing prenatal care. Patient is Dari-speaking only, interpreter present for this encounter. She is currently monitored for the following issues for this high-risk pregnancy and has Supervision of high risk pregnancy, antepartum; History of IUFD; Language barrier; Hypothyroidism; Anemia affecting pregnancy in third trimester-folate/b12 and Fe deficiency; and Polyhydramnios affecting pregnancy on their problem list.  Patient reports no complaints.  Contractions: Not present. Vag. Bleeding: None.  Movement: Present. Denies leaking of fluid.   The following portions of the patient's history were reviewed and updated as appropriate: allergies, current medications, past family history, past medical history, past social history, past surgical history and problem list.   Objective:   Vitals:   08/17/21 1438  BP: 123/62  Pulse: 69  Weight: 166 lb 12.8 oz (75.7 kg)    Fetal Status: Fetal Heart Rate (bpm): NST   Movement: Present     General:  Alert, oriented and cooperative. Patient is in no acute distress.  Skin: Skin is warm and dry. No rash noted.   Cardiovascular: Normal heart rate noted  Respiratory: Normal respiratory effort, no problems with respiration noted  Abdomen: Soft, gravid, appropriate for gestational age.  Pain/Pressure: Absent     Pelvic: Cervical exam deferred        Extremities: Normal range of motion.  Edema: None  Mental Status: Normal mood and affect. Normal behavior. Normal judgment and thought content.   Assessment and Plan:  Pregnancy: [id number] at [redacted]w[redacted]d 1. History of IUFD NST performed today was reviewed and was found to be reactive. Subsequent BPP performed today was also reviewed and was found to be 10/10. AFI was also normal. Continue recommended antenatal testing and prenatal care. IOL to be scheduled around 39 weeks.  2. Polyhydramnios affecting  pregnancy Continue antenatal testing.  3. [redacted] weeks gestation of pregnancy 4. Supervision of high risk pregnancy, antepartum 5. Anemia affecting pregnancy in third trimester Discussed contraception modalities at length with her and her husband. All questions answered. Leaning towards IP Nexplanon. Preterm labor symptoms and general obstetric precautions including but not limited to vaginal bleeding, contractions, leaking of fluid and fetal movement were reviewed in detail with the patient. Please refer to After Visit Summary for other counseling recommendations.   Return in about 1 week (around 08/24/2021) for Please sched Medicaid trans for 12/14 and 12/22 appts.  Future Appointments  Date Time Provider Department Center  08/22/2021 12:45 PM [facility] NURSE [facility]  08/22/2021  1:00 PM [facility] US1 [facility]  08/24/2021  9:00 AM [id number] [facility] None  08/24/2021  2:35 PM Adam Phenix, MD [facility]  08/30/2021  3:15 PM [facility] NST [facility]  09/01/2021 10:15 AM Venora Maples, MD [facility]  09/07/2021  3:15 PM [facility] NST [facility]    Jaynie Collins, MD

## 2021-08-17 NOTE — Addendum Note (Signed)
Addended by: Denyce Robert E on: 08/17/2021 03:42 PM   Modules accepted: Orders

## 2021-08-17 NOTE — Progress Notes (Signed)
Patient in with B12 injection from pharmacy. Patient given B12 IM injection in right arm. No complications/concerns.  [name], [place]  [facility] [id number] Lot [id number] Exp  11/2022

## 2021-08-22 ENCOUNTER — Encounter: Payer: Self-pay | Admitting: *Deleted

## 2021-08-22 ENCOUNTER — Ambulatory Visit: Payer: Medicaid Other | Admitting: *Deleted

## 2021-08-22 ENCOUNTER — Ambulatory Visit: Payer: Medicaid Other | Attending: Obstetrics

## 2021-08-22 ENCOUNTER — Other Ambulatory Visit: Payer: Self-pay

## 2021-08-22 VITALS — BP 125/66 | HR 88

## 2021-08-22 DIAGNOSIS — O3509X0 Maternal care for (suspected) other central nervous system malformation or damage in fetus, not applicable or unspecified: Secondary | ICD-10-CM | POA: Insufficient documentation

## 2021-08-22 DIAGNOSIS — O09293 Supervision of pregnancy with other poor reproductive or obstetric history, third trimester: Secondary | ICD-10-CM

## 2021-08-22 DIAGNOSIS — E039 Hypothyroidism, unspecified: Secondary | ICD-10-CM

## 2021-08-22 DIAGNOSIS — O409XX0 Polyhydramnios, unspecified trimester, not applicable or unspecified: Secondary | ICD-10-CM | POA: Diagnosis present

## 2021-08-22 DIAGNOSIS — O99283 Endocrine, nutritional and metabolic diseases complicating pregnancy, third trimester: Secondary | ICD-10-CM

## 2021-08-22 DIAGNOSIS — O0990 Supervision of high risk pregnancy, unspecified, unspecified trimester: Secondary | ICD-10-CM | POA: Insufficient documentation

## 2021-08-22 DIAGNOSIS — Z789 Other specified health status: Secondary | ICD-10-CM | POA: Insufficient documentation

## 2021-08-22 DIAGNOSIS — Z8759 Personal history of other complications of pregnancy, childbirth and the puerperium: Secondary | ICD-10-CM

## 2021-08-22 DIAGNOSIS — Z3A36 36 weeks gestation of pregnancy: Secondary | ICD-10-CM

## 2021-08-22 DIAGNOSIS — O99013 Anemia complicating pregnancy, third trimester: Secondary | ICD-10-CM | POA: Diagnosis present

## 2021-08-22 IMAGING — US US MFM FETAL BPP W/O NON-STRESS
1 series · 14 of 28 positions shown · non-contrast
Comparison: none

[Series 1: us mfm fetal bpp w/o non-stress · 51 acquisitions, 14 frames shown]
[im 2/51]
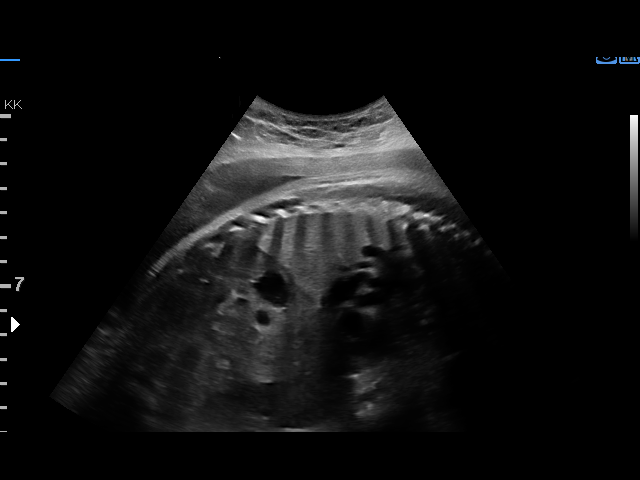
[im 6/51]
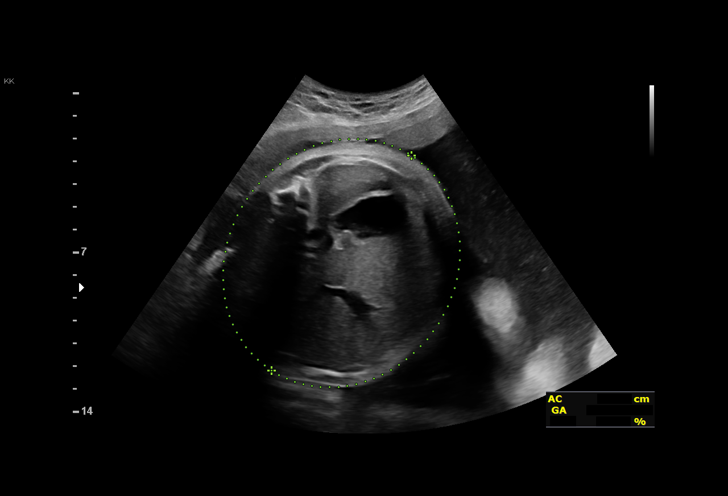
[im 10/51]
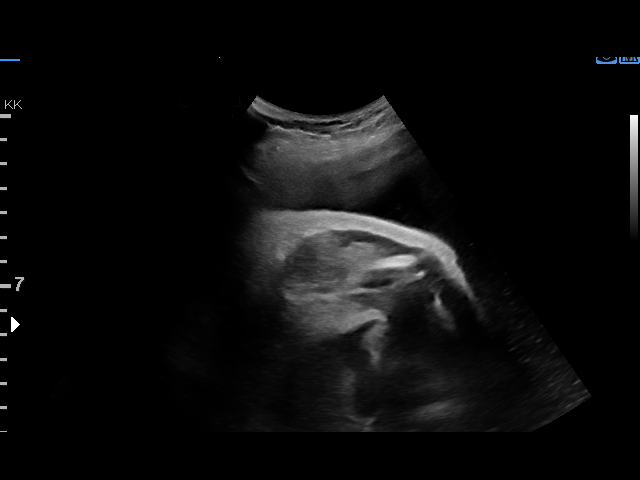
[im 13/51]
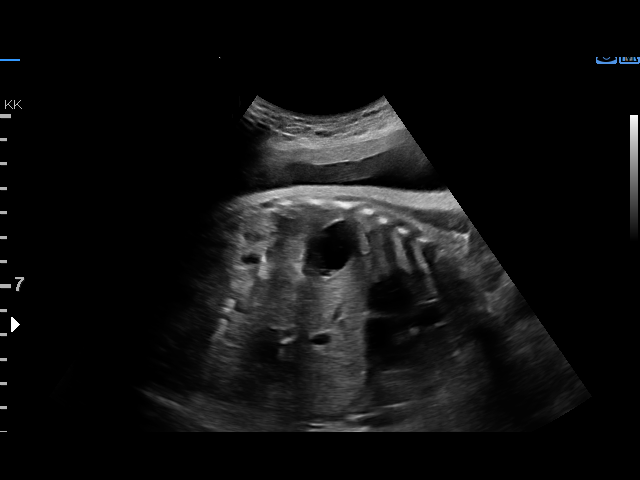
[im 17/51]
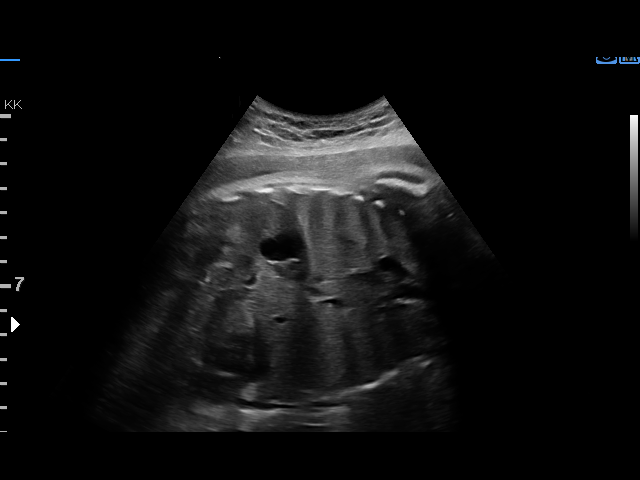
[im 21/51]
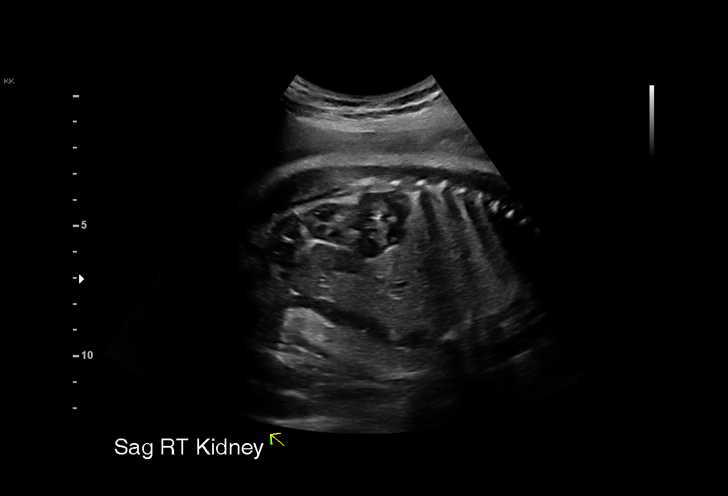
[im 25/51]
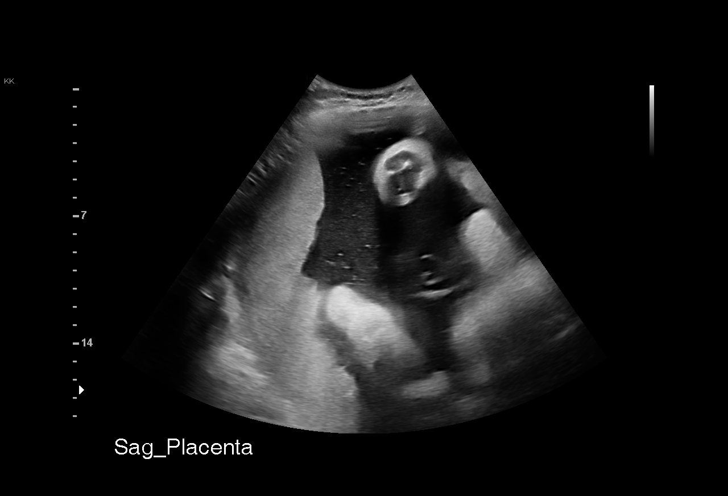
[im 28/51]
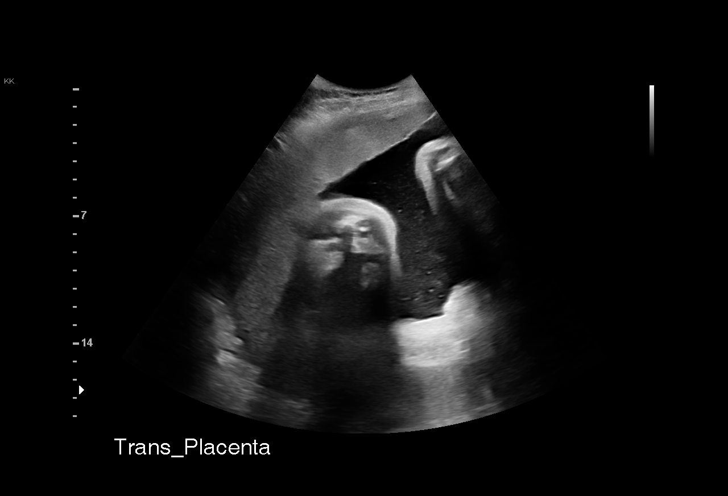
[im 32/51]
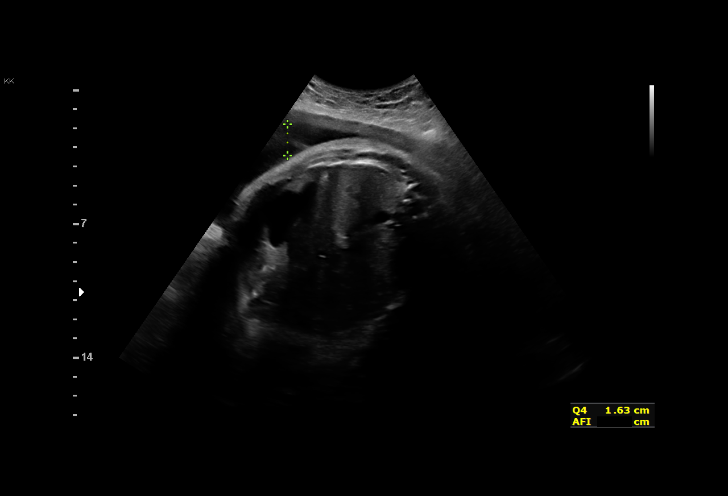
[im 36/51]
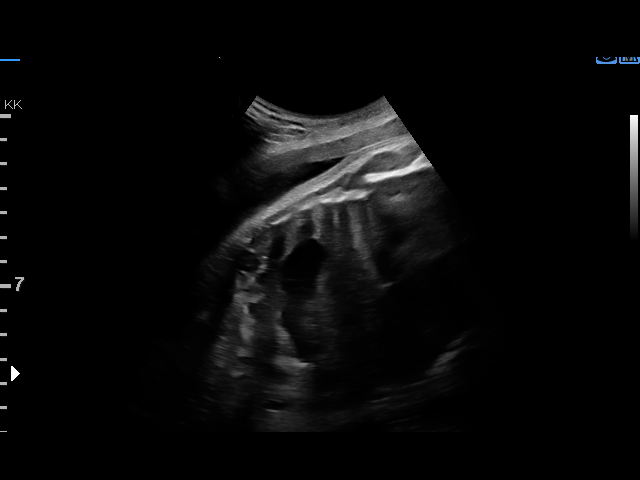
[im 39/51]
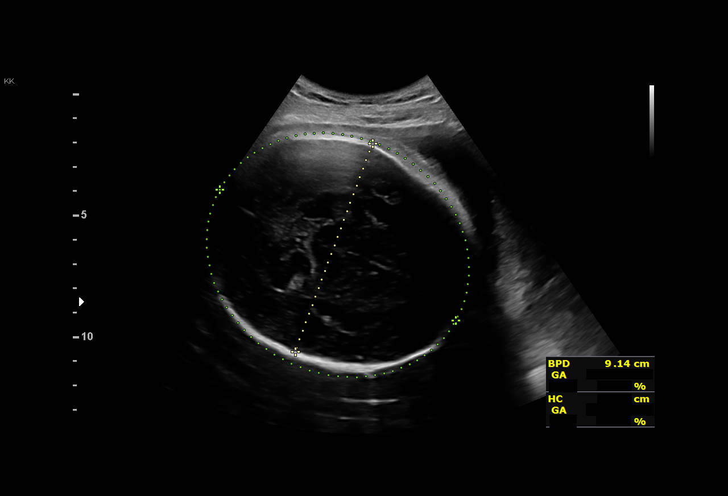
[im 43/51]
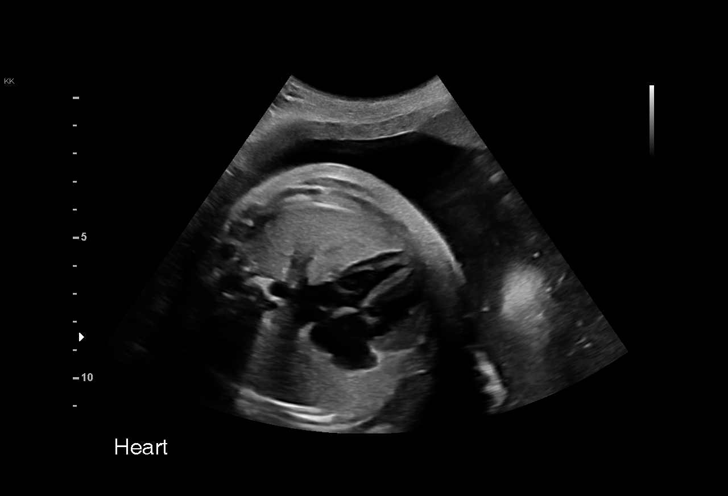
[im 47/51]
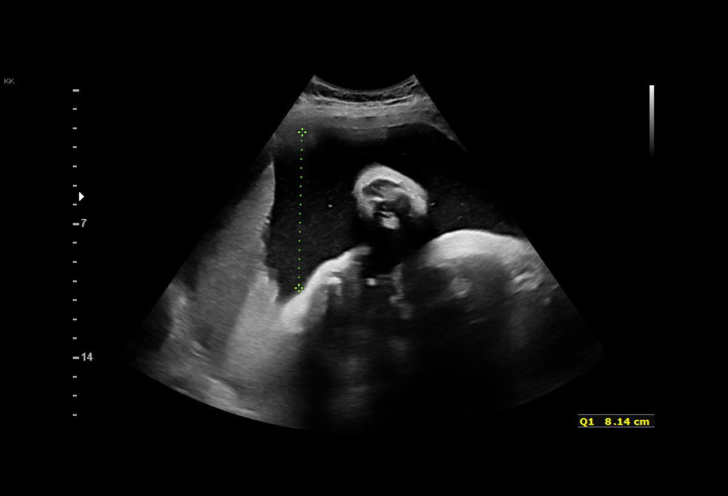
[im 51/51]
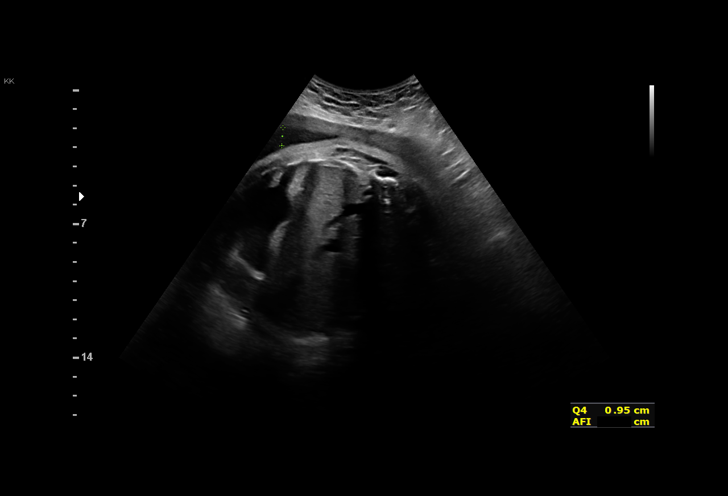

[14 of 28 positions shown; findings below may reference images not displayed]

MEKHLED NP

Indications

 Polyhydramnios, third trimester, antepartum    [50]
 condition or complication, unspecified fetus
 Poor obstetric history: Previous IUFD          [50]
 (stillbirth)
 36 weeks gestation of pregnancy
 Encounter for other antenatal screening        [50]
 follow-up
Fetal Evaluation

 Num Of Fetuses:         1
 Fetal Heart Rate(bpm):  147
 Cardiac Activity:       Observed
 Presentation:           Cephalic
 Placenta:               Posterior
 P. Cord Insertion:      Previously Visualized

 Amniotic Fluid
 AFI FV:      Subjectively upper-normal

 AFI Sum(cm)     %Tile       Largest Pocket(cm)
 18.2            68

 RUQ(cm)       RLQ(cm)       LUQ(cm)        LLQ(cm)

Biophysical Evaluation
 Amniotic F.V:   Pocket => 2 cm             F. Tone:        Observed
 F. Movement:    Observed                   Score:          [DATE]
 F. Breathing:   Observed
Biometry

 BPD:      91.8  mm     G. Age:  37w 2d         88  %    CI:        77.79   %    70 - 86
                                                         FL/HC:      22.0   %    20.1 -
 HC:      329.4  mm     G. Age:  37w 3d         56  %    HC/AC:      0.97        0.93 -
 AC:      341.2  mm     G. Age:  38w 0d         96  %    FL/BPD:     79.1   %    71 - 87
 FL:       72.6  mm     G. Age:  37w 1d         75  %    FL/AC:      21.3   %    20 - 24

 Est. FW:    [50]  gm      7 lb 4 oz     90  %
OB History

 Gravidity:    4         Term:   2        Prem:   1
 Living:       2
Gestational Age

 LMP:           36w 0d        Date:  [DATE]                 EDD:   [DATE]
 U/S Today:     37w 3d                                        EDD:   [DATE]
 Best:          36w 0d     Det. By:  LMP  ([DATE])          EDD:   [DATE]
Anatomy

 Cranium:               Appears normal         Aortic Arch:            Previously seen
 Cavum:                 Appears normal         Ductal Arch:            Previously seen
 Ventricles:            Ventriculomegaly       Diaphragm:              Appears normal
                        Rt 12 mm prev.
 Choroid Plexus:        Previously seen        Stomach:                Appears normal, left
                                                                       sided
 Cerebellum:            Appears normal         Abdomen:                Previously seen
 Posterior Fossa:       Appears normal         Abdominal Wall:         Previously seen
 Nuchal Fold:           Not applicable (>20    Cord Vessels:           Previously seen
                        wks GA)
 Face:                  Orbits and profile     Kidneys:                Appear normal
                        previously seen
 Lips:                  Previously seen        Bladder:                Appears normal
 Thoracic:              Appears normal         Spine:                  Previously seen
 Heart:                 Appears normal         Upper Extremities:      Previously seen
                        (4CH, axis, and
                        situs)
 RVOT:                  Previously seen        Lower Extremities:      Previously seen
 LVOT:                  Previously seen

 Other:  Male gender previously seen. Nasal bones, Lenses, Heels/feet, open
         hands, 3VV previously visualized. Technically difficult due to
         advanced GA and fetal position.
Cervix Uterus Adnexa

 Cervix
 Not visualized (advanced GA >[50])
Comments

 This patient was seen for a follow up growth scan due to a
 prior IUFD at "8 months".  Unilateral mild ventriculomegaly
 was also noted in the fetal brain on her prior exams.  She
 denies any problems since her last exam and reports feeling
 fetal movements throughout the day.
 She was informed that the fetal growth and amniotic fluid
 level appears appropriate for her gestational age.
 A BPP performed today was [DATE].
 The pediatricians should be notified regarding the mild right
 ventriculomegaly (1.2 cm dilated) noted in the fetal brain
 during her prenatal ultrasound exams.  The pediatricians
 should order additional brain imaging after delivery.
 Due to her prior IUFD, she will continue weekly fetal testing in
 your office.
 No further exams were scheduled in our office.
 All conversations were held with the patient today with the
 help of a Pashtun interpreter.

## 2021-08-23 DIAGNOSIS — D509 Iron deficiency anemia, unspecified: Secondary | ICD-10-CM | POA: Insufficient documentation

## 2021-08-23 DIAGNOSIS — O99019 Anemia complicating pregnancy, unspecified trimester: Secondary | ICD-10-CM | POA: Insufficient documentation

## 2021-08-24 ENCOUNTER — Encounter (HOSPITAL_COMMUNITY)
Admission: RE | Admit: 2021-08-24 | Discharge: 2021-08-24 | Disposition: A | Discharge status: Home / Self Care | Payer: Medicaid Other | Source: Ambulatory Visit | Attending: Family Medicine | Admitting: Family Medicine

## 2021-08-24 ENCOUNTER — Ambulatory Visit (INDEPENDENT_AMBULATORY_CARE_PROVIDER_SITE_OTHER): Payer: Medicaid Other | Admitting: Obstetrics & Gynecology

## 2021-08-24 ENCOUNTER — Other Ambulatory Visit (HOSPITAL_COMMUNITY)
Admission: RE | Admit: 2021-08-24 | Discharge: 2021-08-24 | Disposition: A | Discharge status: Home / Self Care | Payer: Medicaid Other | Source: Ambulatory Visit | Attending: Obstetrics & Gynecology | Admitting: Obstetrics & Gynecology

## 2021-08-24 ENCOUNTER — Other Ambulatory Visit: Payer: Self-pay

## 2021-08-24 VITALS — BP 110/69 | HR 80 | Wt 170.0 lb

## 2021-08-24 DIAGNOSIS — D509 Iron deficiency anemia, unspecified: Secondary | ICD-10-CM | POA: Diagnosis present

## 2021-08-24 DIAGNOSIS — Z8759 Personal history of other complications of pregnancy, childbirth and the puerperium: Secondary | ICD-10-CM

## 2021-08-24 DIAGNOSIS — O0990 Supervision of high risk pregnancy, unspecified, unspecified trimester: Secondary | ICD-10-CM | POA: Insufficient documentation

## 2021-08-24 DIAGNOSIS — Z3A36 36 weeks gestation of pregnancy: Secondary | ICD-10-CM | POA: Insufficient documentation

## 2021-08-24 DIAGNOSIS — O99013 Anemia complicating pregnancy, third trimester: Secondary | ICD-10-CM | POA: Insufficient documentation

## 2021-08-24 LAB — OB RESULTS CONSOLE GC/CHLAMYDIA: Gonorrhea: NEGATIVE

## 2021-08-24 MED ORDER — SODIUM CHLORIDE 0.9 % IV SOLN
300.0000 mg | INTRAVENOUS | Status: DC
  Administered 2021-08-24: 300 mg via INTRAVENOUS
  Filled 2021-08-24: qty 300

## 2021-08-24 NOTE — Progress Notes (Signed)
Patient does not want routine vaginal swabs done

## 2021-08-24 NOTE — Progress Notes (Signed)
   PRENATAL VISIT NOTE  Subjective:  Jessica Robbins is a 20 y.o. [phone] at [redacted]w[redacted]d being seen today for ongoing prenatal care.  She is currently monitored for the following issues for this high-risk pregnancy and has Supervision of high risk pregnancy, antepartum; History of IUFD; Language barrier; Hypothyroidism; Anemia affecting pregnancy in third trimester-folate/b12 and Fe deficiency; Polyhydramnios affecting pregnancy; and Iron deficiency anemia of mother during pregnancy on their problem list.  Patient reports no complaints.  Contractions: Not present. Vag. Bleeding: None.  Movement: Present. Denies leaking of fluid.   The following portions of the patient's history were reviewed and updated as appropriate: allergies, current medications, past family history, past medical history, past social history, past surgical history and problem list.   Objective:   Vitals:   08/24/21 1512  BP: 110/69  Pulse: 80  Weight: 170 lb (77.1 kg)    Fetal Status: Fetal Heart Rate (bpm): 141   Movement: Present     General:  Alert, oriented and cooperative. Patient is in no acute distress.  Skin: Skin is warm and dry. No rash noted.   Cardiovascular: Normal heart rate noted  Respiratory: Normal respiratory effort, no problems with respiration noted  Abdomen: Soft, gravid, appropriate for gestational age.  Pain/Pressure: Absent     Pelvic: Cervical exam deferred        Extremities: Normal range of motion.  Edema: None  Mental Status: Normal mood and affect. Normal behavior. Normal judgment and thought content.   Assessment and Plan:  Pregnancy: [id number] at [redacted]w[redacted]d 1. History of IUFD MFM f/u  2. Supervision of high risk pregnancy, antepartum Self swab for testing today  Preterm labor symptoms and general obstetric precautions including but not limited to vaginal bleeding, contractions, leaking of fluid and fetal movement were reviewed in detail with the patient. Please refer to After Visit Summary for  other counseling recommendations.   Return in about 1 week (around 08/31/2021).  Future Appointments  Date Time Provider Department Center  08/30/2021  3:15 PM [facility] NST [facility]  09/01/2021 10:15 AM Venora Maples, MD [facility]  09/07/2021  3:15 PM [facility] NST [facility]    Scheryl Darter, MD

## 2021-08-24 NOTE — Progress Notes (Signed)
Used interpretor [phone] to explain procedure and to call if any problems during infusion chest pain, breathing problems, itching or any symptoms of problems and client voiced understanding

## 2021-08-25 LAB — CERVICOVAGINAL ANCILLARY ONLY
Chlamydia: NEGATIVE
Comment: NEGATIVE
Comment: NORMAL
Neisseria Gonorrhea: NEGATIVE

## 2021-08-28 LAB — CULTURE, BETA STREP (GROUP B ONLY): Strep Gp B Culture: NEGATIVE

## 2021-08-30 ENCOUNTER — Other Ambulatory Visit: Payer: Medicaid Other

## 2021-09-01 ENCOUNTER — Ambulatory Visit (INDEPENDENT_AMBULATORY_CARE_PROVIDER_SITE_OTHER): Payer: Medicaid Other

## 2021-09-01 ENCOUNTER — Ambulatory Visit (INDEPENDENT_AMBULATORY_CARE_PROVIDER_SITE_OTHER): Payer: Medicaid Other | Admitting: Obstetrics and Gynecology

## 2021-09-01 ENCOUNTER — Encounter: Payer: Self-pay | Admitting: Obstetrics and Gynecology

## 2021-09-01 ENCOUNTER — Other Ambulatory Visit: Payer: Self-pay

## 2021-09-01 VITALS — BP 120/71 | HR 76 | Wt 168.3 lb

## 2021-09-01 DIAGNOSIS — Z8759 Personal history of other complications of pregnancy, childbirth and the puerperium: Secondary | ICD-10-CM

## 2021-09-01 DIAGNOSIS — Z789 Other specified health status: Secondary | ICD-10-CM

## 2021-09-01 DIAGNOSIS — O409XX0 Polyhydramnios, unspecified trimester, not applicable or unspecified: Secondary | ICD-10-CM

## 2021-09-01 DIAGNOSIS — O99013 Anemia complicating pregnancy, third trimester: Secondary | ICD-10-CM

## 2021-09-01 DIAGNOSIS — O0990 Supervision of high risk pregnancy, unspecified, unspecified trimester: Secondary | ICD-10-CM

## 2021-09-01 IMAGING — US US FETAL BPP W/ NON-STRESS
1 series · 12 of 12 positions shown · non-contrast
Comparison: none

[Series 1: us fetal bpp w/ non-stress · 12 acquisitions, 12 frames shown]
[im 1/12]
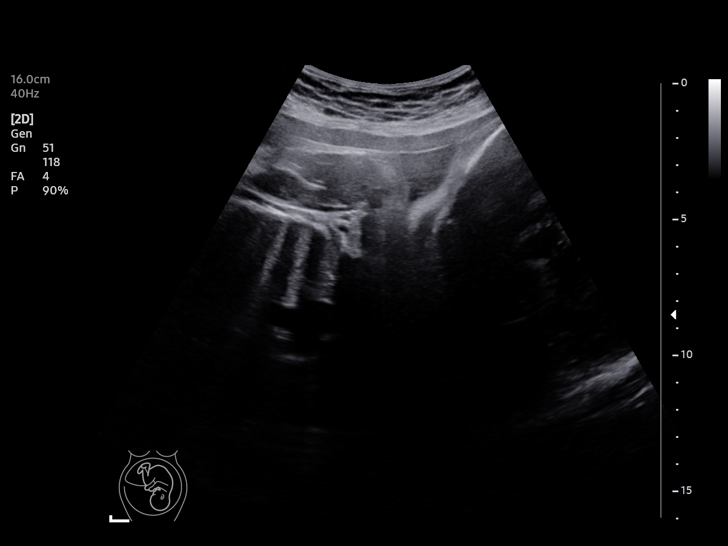
[im 2/12]
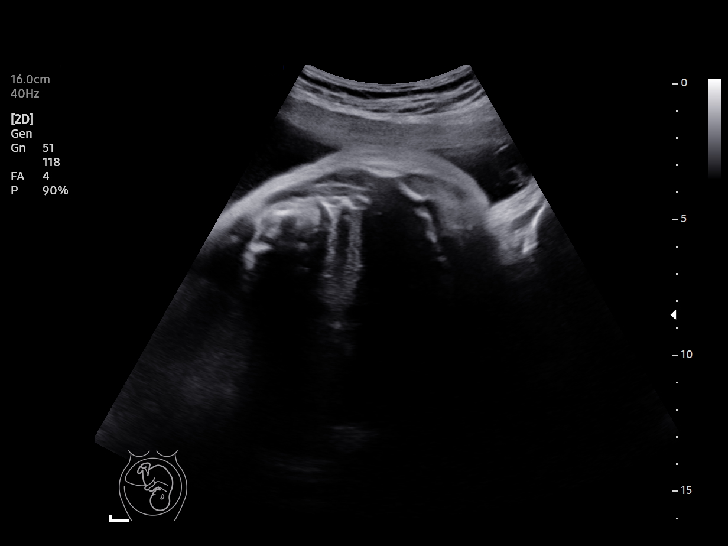
[im 3/12]
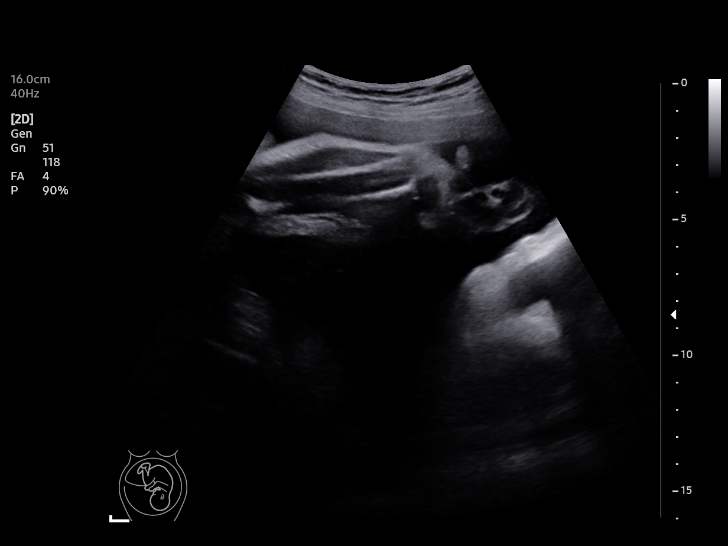
[im 4/12]
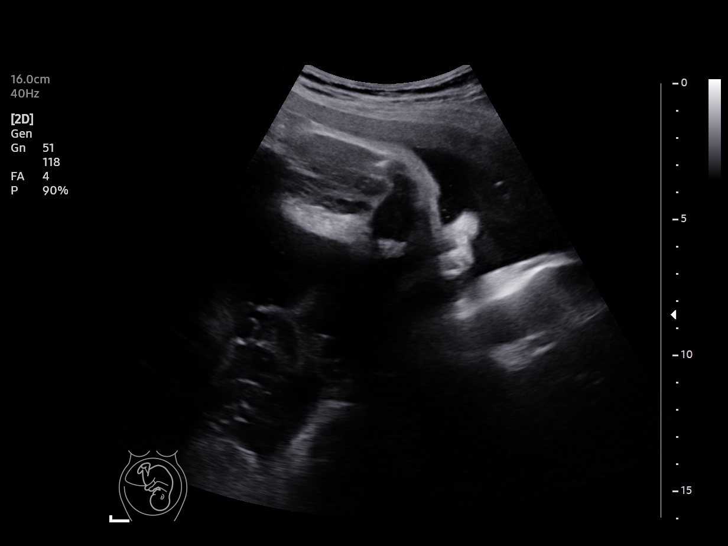
[im 5/12]
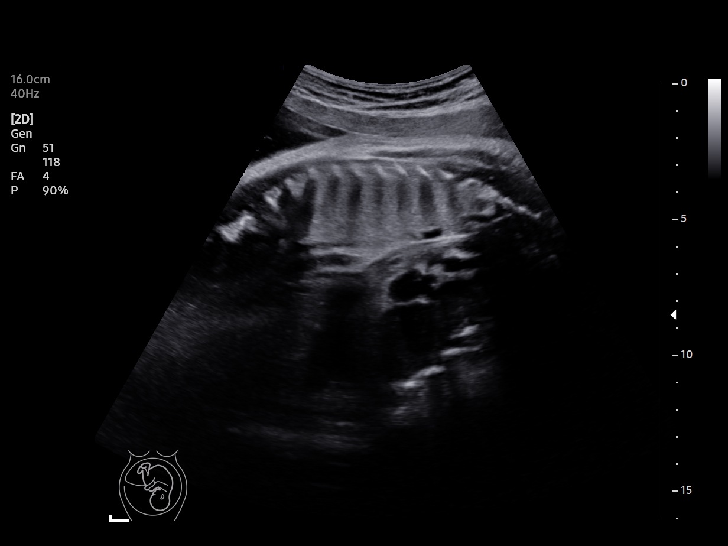
[im 6/12]
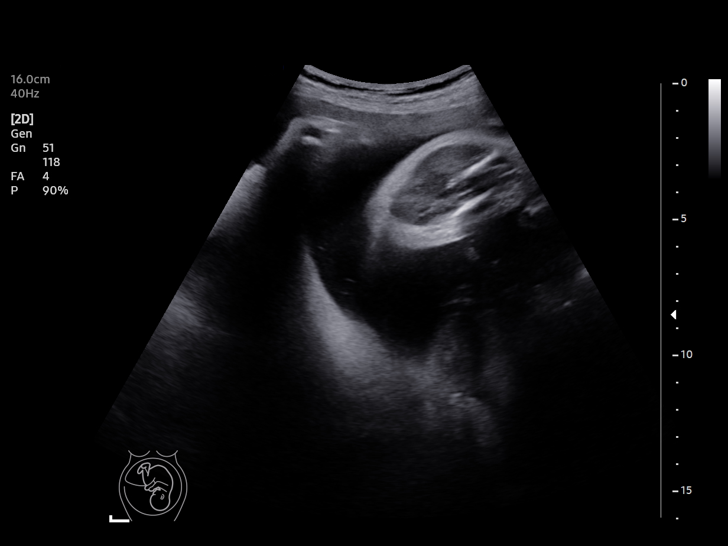
[im 7/12]
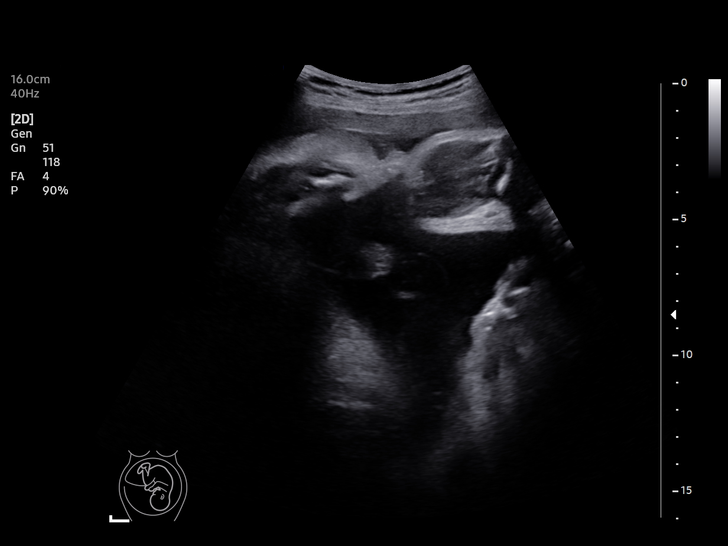
[im 8/12]
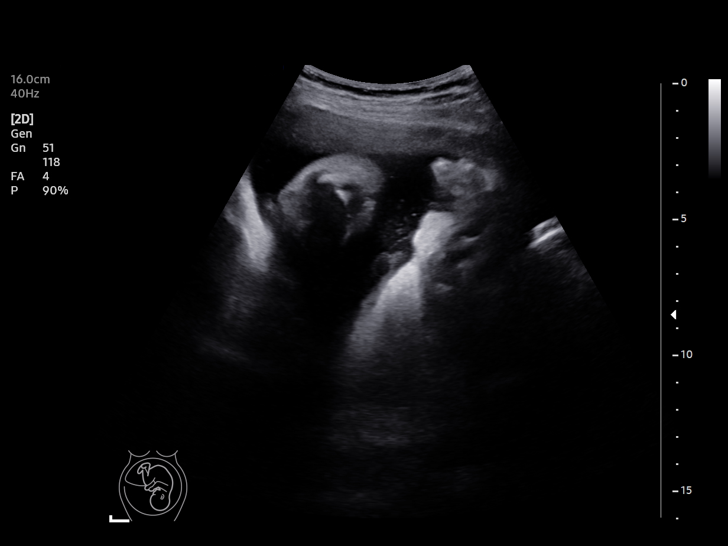
[im 9/12]
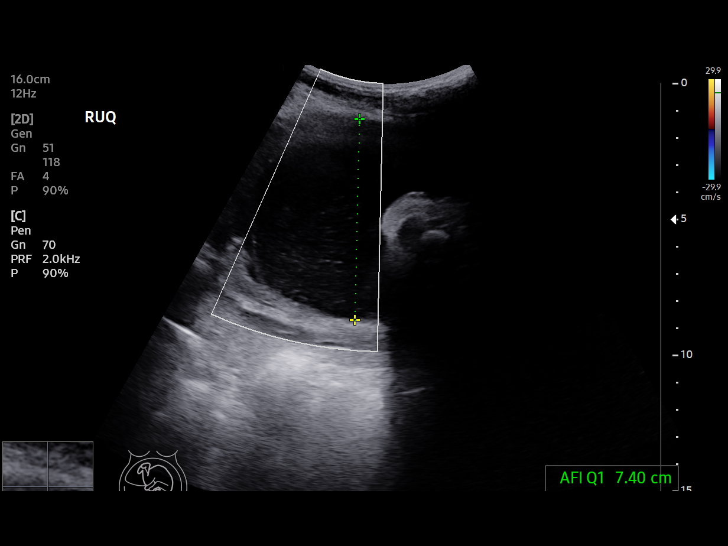
[im 10/12]
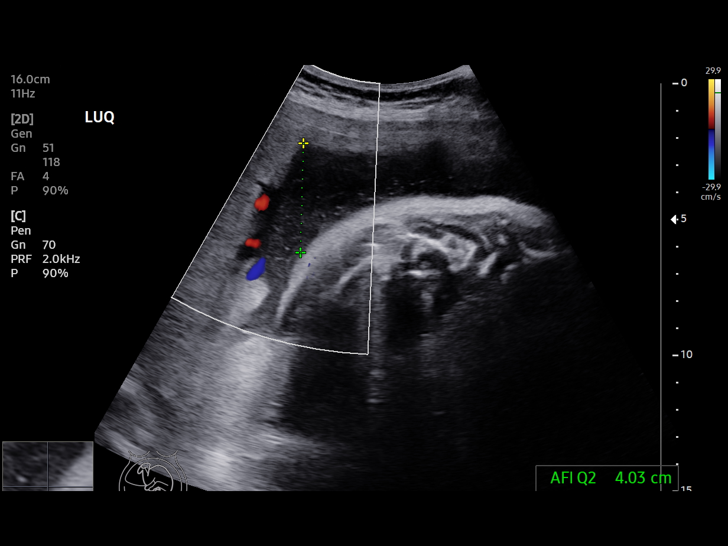
[im 11/12]
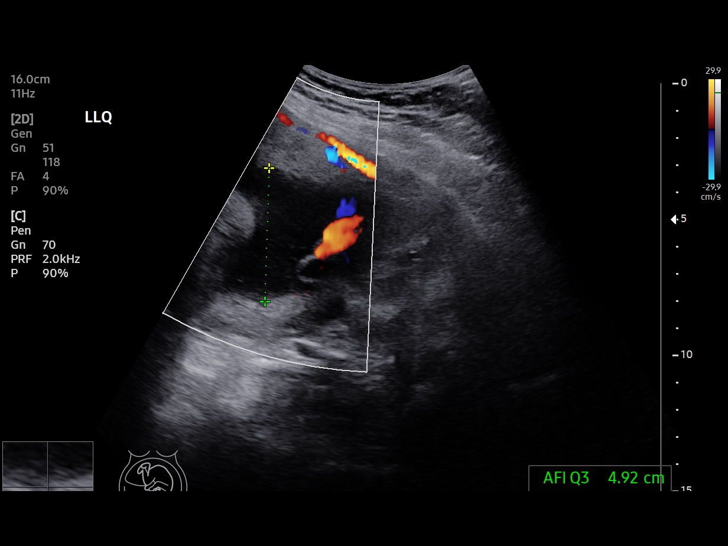
[im 12/12]
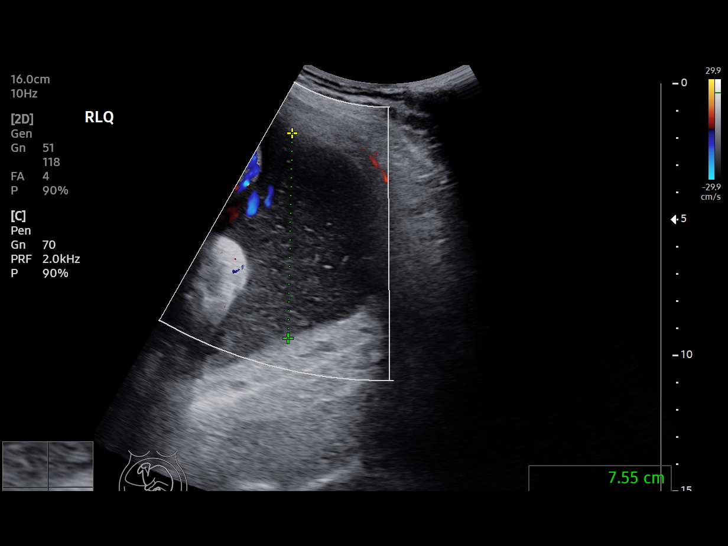

[12 of 12 positions shown; findings below may reference images not displayed]

[REDACTED]care at
                   MARTELLACCI NP

 1  US FETAL BPP W/NONSTRESS              76818.4     MARTELLACCI

Service(s) Provided

Indications

 37 weeks gestation of pregnancy
 Polyhydramnios, third trimester, antepartum    [JP]
 condition or complication, unspecified fetus
 Poor obstetric history: Previous IUFD          [JP]
 (stillbirth)
Fetal Evaluation

 Num Of Fetuses:         1
 Preg. Location:         Intrauterine
 Cardiac Activity:       Observed
 Presentation:           Cephalic

 Amniotic Fluid
 AFI FV:      Subjectively upper-normal

 AFI Sum(cm)     %Tile       Largest Pocket(cm)
 23.9            94

 RUQ(cm)       RLQ(cm)       LUQ(cm)        LLQ(cm)


 Comment:    Breathing noted intermittently, but not sustained.
Biophysical Evaluation
 Amniotic F.V:   Pocket => 2 cm             F. Tone:        Observed
 F. Movement:    Observed                   N.S.T:          Reactive
 F. Breathing:   Not Observed               Score:          [DATE]
OB History

 Gravidity:    4         Term:   2        Prem:   1
 Living:       2
Gestational Age

 LMP:           37w 3d        Date:  [DATE]                 EDD:   [DATE]
 Best:          37w 3d     Det. By:  LMP  ([DATE])          EDD:   [DATE]
Impression

 Viable fetus in cephalic presentation with BPP [DATE]
Recommendations

 Continue weekly antenatal testing till delivery .
                MARTELLACCI

## 2021-09-01 NOTE — Progress Notes (Signed)
° °  PRENATAL VISIT NOTE  Subjective:  Jessica Robbins is a 20 y.o. [phone] at [redacted]w[redacted]d being seen today for ongoing prenatal care.  She is currently monitored for the following issues for this high-risk pregnancy and has Supervision of high risk pregnancy, antepartum; History of IUFD; Language barrier; Hypothyroidism; Anemia affecting pregnancy in third trimester-folate/b12 and Fe deficiency; Polyhydramnios affecting pregnancy; and Iron deficiency anemia of mother during pregnancy on their problem list.  Patient reports no complaints.  Contractions: Irritability. Vag. Bleeding: None.  Movement: Present. Denies leaking of fluid.   The following portions of the patient's history were reviewed and updated as appropriate: allergies, current medications, past family history, past medical history, past social history, past surgical history and problem list.   Objective:   Vitals:   09/01/21 1028  BP: 120/71  Pulse: 76  Weight: 168 lb 4.8 oz (76.3 kg)    Fetal Status: Fetal Heart Rate (bpm): 145   Movement: Present     General:  Alert, oriented and cooperative. Patient is in no acute distress.  Skin: Skin is warm and dry. No rash noted.   Cardiovascular: Normal heart rate noted  Respiratory: Normal respiratory effort, no problems with respiration noted  Abdomen: Soft, gravid, appropriate for gestational age.  Pain/Pressure: Present     Pelvic: Cervical exam deferred        Extremities: Normal range of motion.  Edema: None  Mental Status: Normal mood and affect. Normal behavior. Normal judgment and thought content.   Assessment and Plan:  Pregnancy: [id number] at [redacted]w[redacted]d 1. Supervision of high risk pregnancy, antepartum Patient is doing well without complaints  2. Anemia affecting pregnancy in third trimester-folate/b12 and Fe deficiency Continue iron supplement  3. History of IUFD Plan for IOL at 39 weeks (form faxed) Continue weekly antenatal testing  4. Language barrier Interpreter  present  5. Polyhydramnios affecting pregnancy   Term labor symptoms and general obstetric precautions including but not limited to vaginal bleeding, contractions, leaking of fluid and fetal movement were reviewed in detail with the patient. Please refer to After Visit Summary for other counseling recommendations.   Return in about 1 week (around 09/08/2021) for in person, ROB, High risk.  Future Appointments  Date Time Provider Department Center  09/07/2021  3:15 PM [facility]    Catalina Antigua, MD

## 2021-09-06 ENCOUNTER — Other Ambulatory Visit: Payer: Self-pay | Admitting: Advanced Practice Midwife

## 2021-09-07 ENCOUNTER — Ambulatory Visit: Payer: Medicaid Other | Admitting: *Deleted

## 2021-09-07 ENCOUNTER — Inpatient Hospital Stay (HOSPITAL_COMMUNITY): Admit: 2021-09-07 | Payer: Medicaid Other | Admitting: Family Medicine

## 2021-09-07 ENCOUNTER — Ambulatory Visit (INDEPENDENT_AMBULATORY_CARE_PROVIDER_SITE_OTHER): Payer: Medicaid Other

## 2021-09-07 ENCOUNTER — Other Ambulatory Visit: Payer: Self-pay

## 2021-09-07 VITALS — BP 124/75 | HR 73 | Wt 168.5 lb

## 2021-09-07 DIAGNOSIS — Z8759 Personal history of other complications of pregnancy, childbirth and the puerperium: Secondary | ICD-10-CM

## 2021-09-07 IMAGING — US US FETAL BPP W/ NON-STRESS
1 series · 14 of 16 positions shown · non-contrast
Comparison: none

[Series 1: us fetal bpp w/ non-stress · 16 acquisitions, 14 frames shown]
[im 1/16]
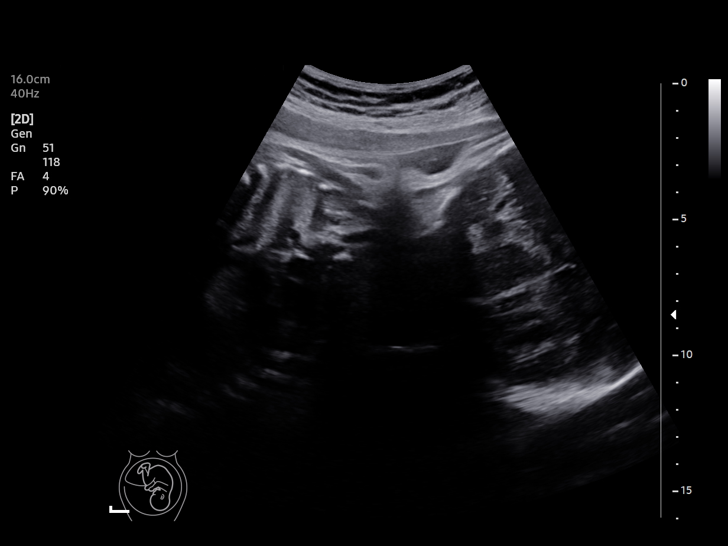
[im 2/16]
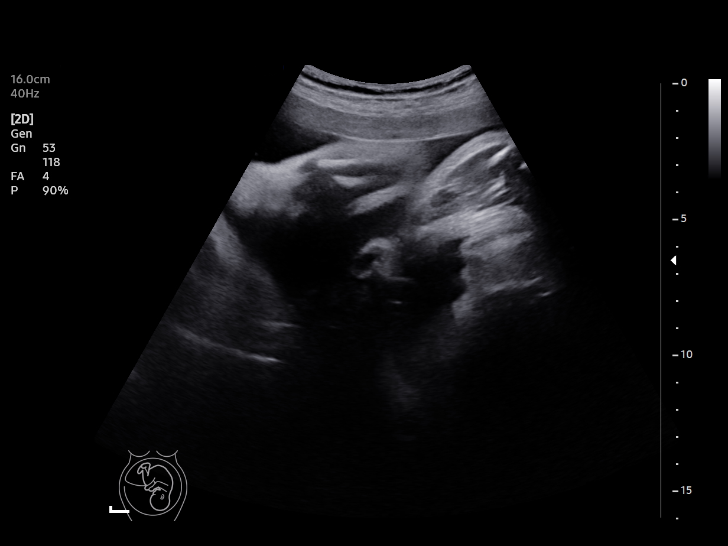
[im 3/16]
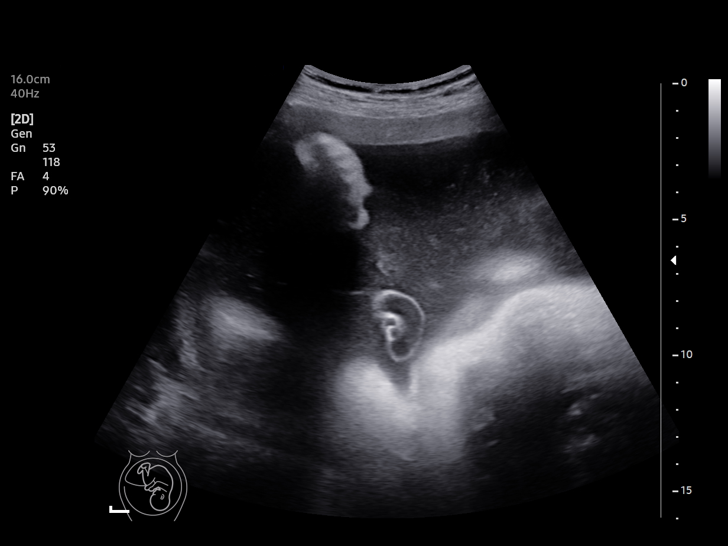
[im 5/16]
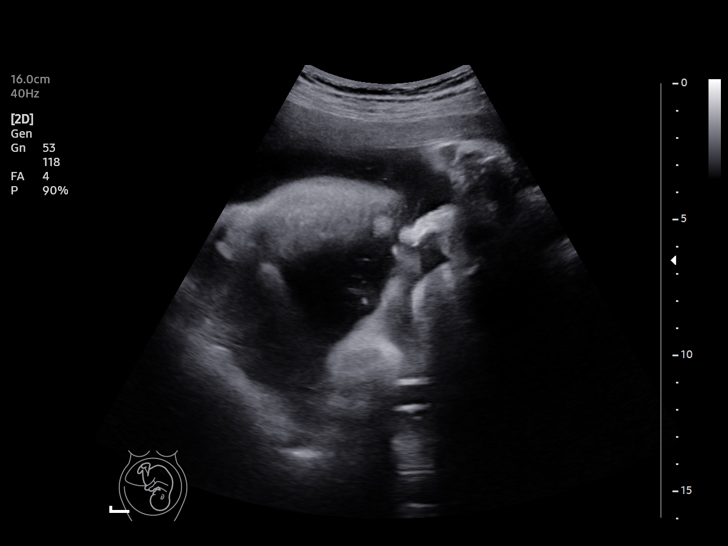
[im 6/16]
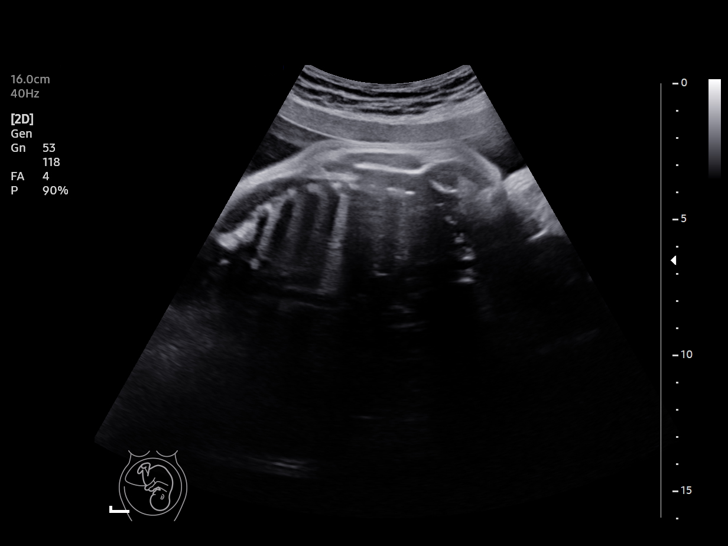
[im 7/16]
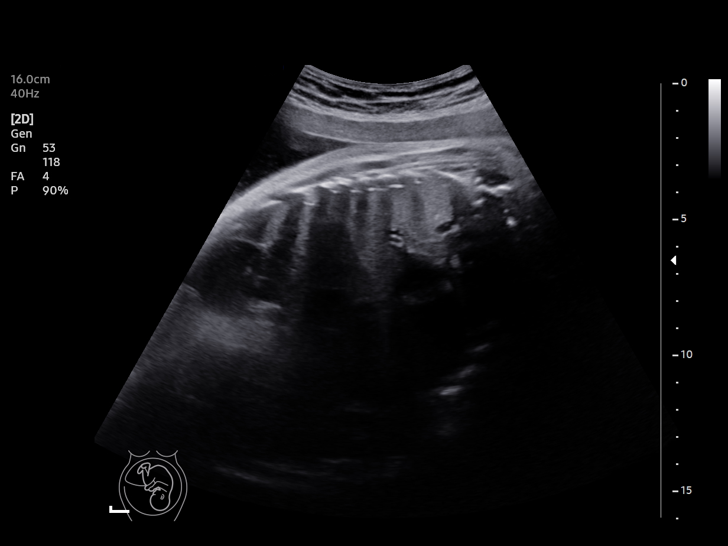
[im 8/16]
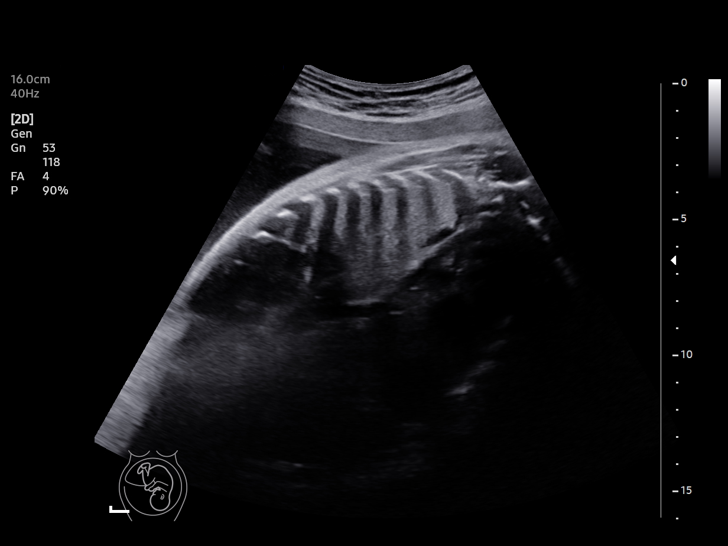
[im 9/16]
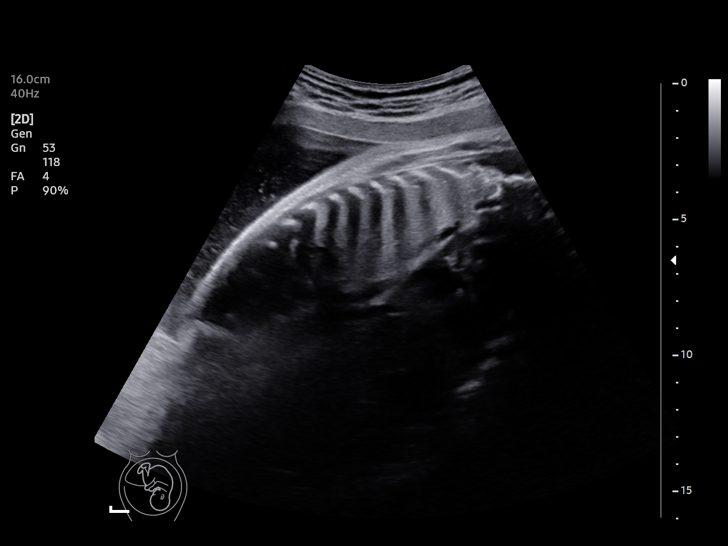
[im 10/16]
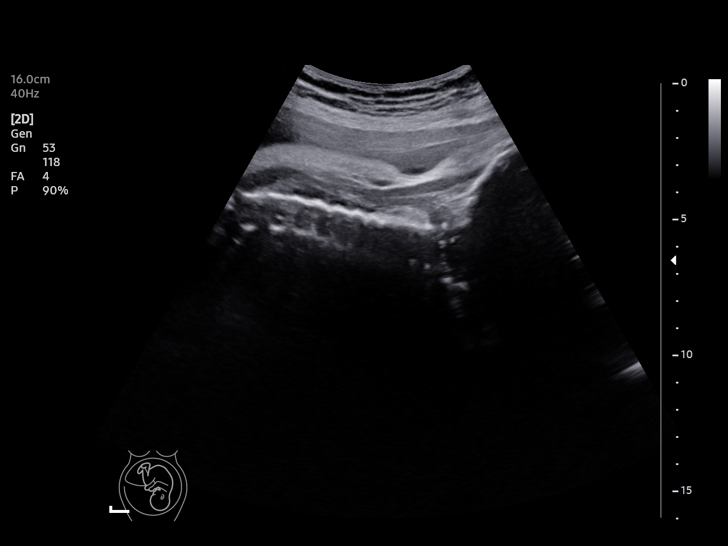
[im 11/16]
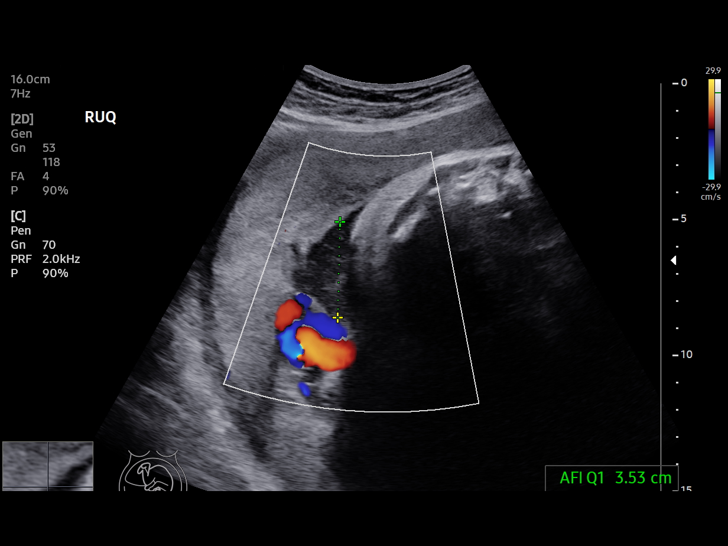
[im 13/16]
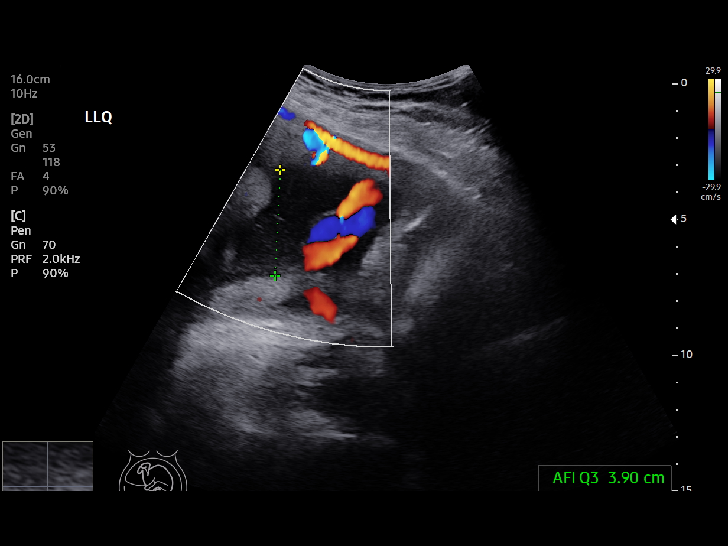
[im 14/16]
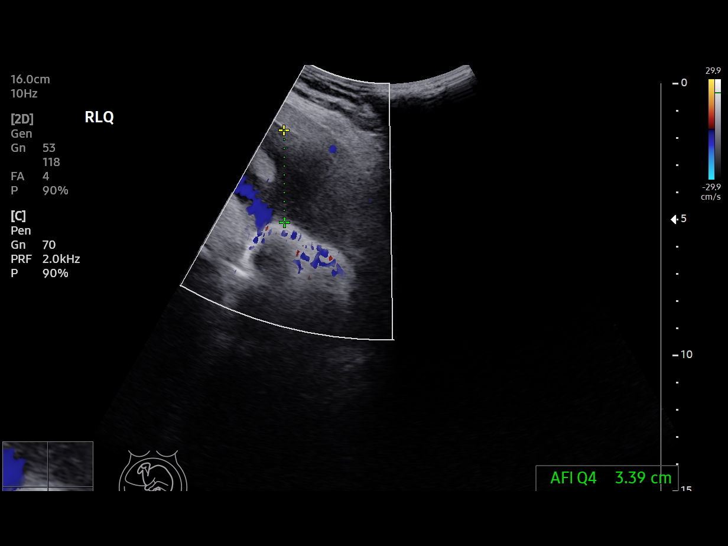
[im 15/16]
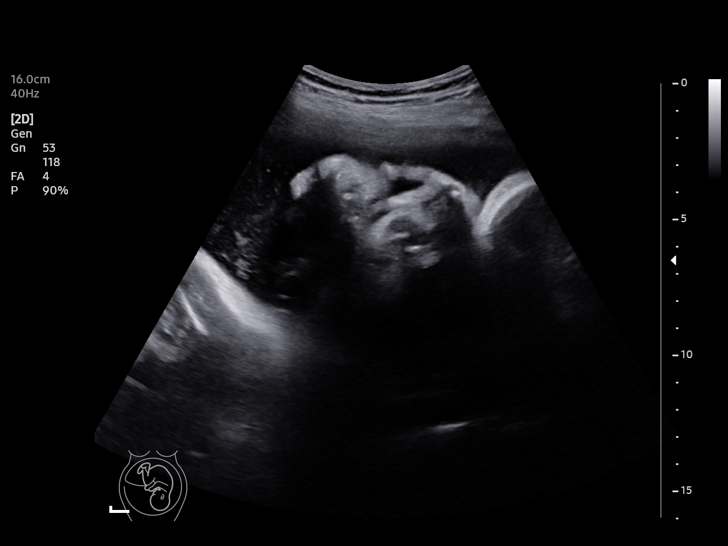
[im 16/16]
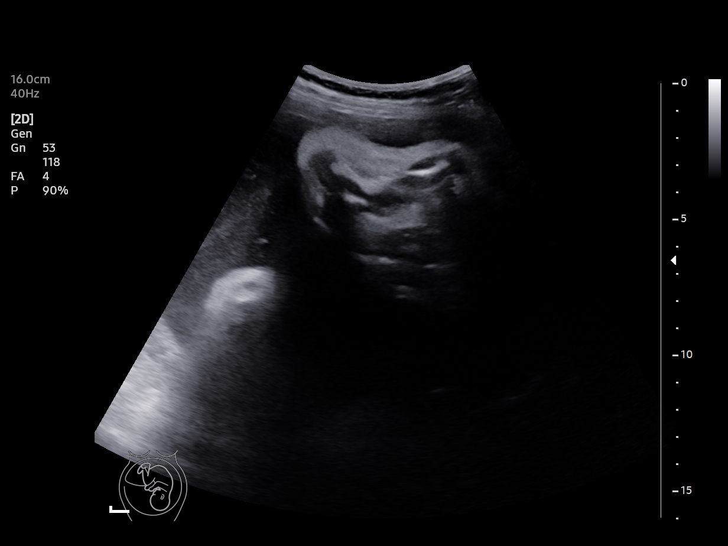

[14 of 16 positions shown; findings below may reference images not displayed]

[REDACTED]care at
                   HUBERT NP

Service(s) Provided

Indications

 38 weeks gestation of pregnancy
 Poor obstetric history: Previous IUFD          [G3]
 (stillbirth)
Fetal Evaluation

 Num Of Fetuses:         1
 Preg. Location:         Intrauterine
 Cardiac Activity:       Observed
 Presentation:           Cephalic

 Amniotic Fluid
 AFI FV:      Within normal limits

 AFI Sum(cm)     %Tile       Largest Pocket(cm)
 13.67           53

 RUQ(cm)       RLQ(cm)       LUQ(cm)        LLQ(cm)


 Comment:    [DATE] BPP
Biophysical Evaluation
 Amniotic F.V:   Pocket => 2 cm             F. Tone:        Observed
 F. Movement:    Observed                   N.S.T:          Reactive
 F. Breathing:   Observed                   Score:          [DATE]
OB History

 Gravidity:    4         Term:   2        Prem:   1
 Living:       2
Gestational Age

 LMP:           38w 2d        Date:  [DATE]                 EDD:   [DATE]
 Best:          38w 2d     Det. By:  LMP  ([DATE])          EDD:   [DATE]

## 2021-09-07 NOTE — Progress Notes (Signed)

## 2021-09-12 ENCOUNTER — Inpatient Hospital Stay (HOSPITAL_COMMUNITY)
Admission: AD | Admit: 2021-09-12 | Discharge: 2021-09-15 | DRG: 807 | Disposition: A | Discharge status: Home / Self Care | Payer: Medicaid Other | Attending: Obstetrics and Gynecology | Admitting: Obstetrics and Gynecology

## 2021-09-12 ENCOUNTER — Inpatient Hospital Stay (HOSPITAL_COMMUNITY): Payer: Medicaid Other | Admitting: Anesthesiology

## 2021-09-12 ENCOUNTER — Inpatient Hospital Stay (HOSPITAL_COMMUNITY): Payer: Medicaid Other

## 2021-09-12 ENCOUNTER — Other Ambulatory Visit: Payer: Self-pay

## 2021-09-12 ENCOUNTER — Encounter (HOSPITAL_COMMUNITY): Payer: Self-pay | Admitting: Obstetrics and Gynecology

## 2021-09-12 DIAGNOSIS — O9902 Anemia complicating childbirth: Secondary | ICD-10-CM | POA: Diagnosis not present

## 2021-09-12 DIAGNOSIS — D509 Iron deficiency anemia, unspecified: Secondary | ICD-10-CM | POA: Diagnosis not present

## 2021-09-12 DIAGNOSIS — O26893 Other specified pregnancy related conditions, third trimester: Secondary | ICD-10-CM | POA: Diagnosis not present

## 2021-09-12 DIAGNOSIS — Z789 Other specified health status: Secondary | ICD-10-CM

## 2021-09-12 DIAGNOSIS — O0990 Supervision of high risk pregnancy, unspecified, unspecified trimester: Secondary | ICD-10-CM

## 2021-09-12 DIAGNOSIS — Z3490 Encounter for supervision of normal pregnancy, unspecified, unspecified trimester: Secondary | ICD-10-CM | POA: Diagnosis present

## 2021-09-12 DIAGNOSIS — O99013 Anemia complicating pregnancy, third trimester: Secondary | ICD-10-CM

## 2021-09-12 DIAGNOSIS — Z30017 Encounter for initial prescription of implantable subdermal contraceptive: Secondary | ICD-10-CM | POA: Diagnosis not present

## 2021-09-12 DIAGNOSIS — Z3A39 39 weeks gestation of pregnancy: Secondary | ICD-10-CM | POA: Diagnosis not present

## 2021-09-12 DIAGNOSIS — Z20822 Contact with and (suspected) exposure to covid-19: Secondary | ICD-10-CM | POA: Diagnosis present

## 2021-09-12 DIAGNOSIS — O403XX0 Polyhydramnios, third trimester, not applicable or unspecified: Principal | ICD-10-CM | POA: Diagnosis present

## 2021-09-12 DIAGNOSIS — O09293 Supervision of pregnancy with other poor reproductive or obstetric history, third trimester: Secondary | ICD-10-CM | POA: Diagnosis not present

## 2021-09-12 DIAGNOSIS — O99284 Endocrine, nutritional and metabolic diseases complicating childbirth: Secondary | ICD-10-CM | POA: Diagnosis not present

## 2021-09-12 DIAGNOSIS — E039 Hypothyroidism, unspecified: Secondary | ICD-10-CM | POA: Diagnosis not present

## 2021-09-12 DIAGNOSIS — O409XX0 Polyhydramnios, unspecified trimester, not applicable or unspecified: Secondary | ICD-10-CM

## 2021-09-12 DIAGNOSIS — Z8759 Personal history of other complications of pregnancy, childbirth and the puerperium: Secondary | ICD-10-CM

## 2021-09-12 DIAGNOSIS — Z880 Allergy status to penicillin: Secondary | ICD-10-CM | POA: Diagnosis not present

## 2021-09-12 LAB — CBC
HCT: 35.6 % — ABNORMAL LOW (ref 36.0–46.0)
Hemoglobin: 12 g/dL (ref 12.0–15.0)
MCH: 26.1 pg (ref 26.0–34.0)
MCHC: 33.7 g/dL (ref 30.0–36.0)
MCV: 77.6 fL — ABNORMAL LOW (ref 80.0–100.0)
Platelets: 279 10*3/uL (ref 150–400)
RBC: 4.59 MIL/uL (ref 3.87–5.11)
RDW: 20.8 % — ABNORMAL HIGH (ref 11.5–15.5)
WBC: 8.9 10*3/uL (ref 4.0–10.5)
nRBC: 0 % (ref 0.0–0.2)

## 2021-09-12 LAB — TYPE AND SCREEN
ABO/RH(D): O POS
Antibody Screen: NEGATIVE

## 2021-09-12 LAB — RESP PANEL BY RT-PCR (FLU A&B, COVID) ARPGX2
Influenza A by PCR: NEGATIVE
Influenza B by PCR: NEGATIVE
SARS Coronavirus 2 by RT PCR: NEGATIVE

## 2021-09-12 MED ORDER — PHENYLEPHRINE 40 MCG/ML (10ML) SYRINGE FOR IV PUSH (FOR BLOOD PRESSURE SUPPORT): 80.0000 ug | PREFILLED_SYRINGE | INTRAVENOUS | Status: DC | PRN

## 2021-09-12 MED ORDER — EPHEDRINE 5 MG/ML INJ: 10.0000 mg | INTRAVENOUS | Status: DC | PRN

## 2021-09-12 MED ORDER — LACTATED RINGERS IV SOLN: 500.0000 mL | Freq: Once | INTRAVENOUS | Status: DC

## 2021-09-12 MED ORDER — LACTATED RINGERS IV SOLN: 500.0000 mL | INTRAVENOUS | Status: DC | PRN

## 2021-09-12 MED ORDER — LIDOCAINE HCL (PF) 1 % IJ SOLN
INTRAMUSCULAR | Status: DC | PRN
  Administered 2021-09-12: 3 mL via EPIDURAL
  Administered 2021-09-12: 2 mL via EPIDURAL
  Administered 2021-09-12: 5 mL via EPIDURAL

## 2021-09-12 MED ORDER — SOD CITRATE-CITRIC ACID 500-334 MG/5ML PO SOLN: 30.0000 mL | ORAL | Status: DC | PRN

## 2021-09-12 MED ORDER — DIPHENHYDRAMINE HCL 50 MG/ML IJ SOLN: 12.5000 mg | INTRAMUSCULAR | Status: DC | PRN

## 2021-09-12 MED ORDER — OXYCODONE-ACETAMINOPHEN 5-325 MG PO TABS: 1.0000 | ORAL_TABLET | ORAL | Status: DC | PRN

## 2021-09-12 MED ORDER — ONDANSETRON HCL 4 MG/2ML IJ SOLN: 4.0000 mg | Freq: Four times a day (QID) | INTRAMUSCULAR | Status: DC | PRN

## 2021-09-12 MED ORDER — ACETAMINOPHEN 325 MG PO TABS
650.0000 mg | ORAL_TABLET | ORAL | Status: DC | PRN
  Administered 2021-09-13: 03:00:00 650 mg via ORAL
  Filled 2021-09-12: qty 2

## 2021-09-12 MED ORDER — OXYTOCIN-SODIUM CHLORIDE 30-0.9 UT/500ML-% IV SOLN
1.0000 m[IU]/min | INTRAVENOUS | Status: DC
  Administered 2021-09-12: 20:00:00 2 m[IU]/min via INTRAVENOUS

## 2021-09-12 MED ORDER — LACTATED RINGERS IV SOLN: INTRAVENOUS | Status: DC

## 2021-09-12 MED ORDER — LIDOCAINE HCL (PF) 1 % IJ SOLN: 30.0000 mL | INTRAMUSCULAR | Status: DC | PRN

## 2021-09-12 MED ORDER — OXYTOCIN-SODIUM CHLORIDE 30-0.9 UT/500ML-% IV SOLN
2.5000 [IU]/h | INTRAVENOUS | Status: DC
  Filled 2021-09-12: qty 500

## 2021-09-12 MED ORDER — TERBUTALINE SULFATE 1 MG/ML IJ SOLN: 0.2500 mg | Freq: Once | INTRAMUSCULAR | Status: DC | PRN

## 2021-09-12 MED ORDER — OXYTOCIN BOLUS FROM INFUSION
333.0000 mL | Freq: Once | INTRAVENOUS | Status: AC
  Administered 2021-09-13: 01:00:00 333 mL via INTRAVENOUS

## 2021-09-12 MED ORDER — OXYCODONE-ACETAMINOPHEN 5-325 MG PO TABS: 2.0000 | ORAL_TABLET | ORAL | Status: DC | PRN

## 2021-09-12 MED ORDER — FENTANYL-BUPIVACAINE-NACL 0.5-0.125-0.9 MG/250ML-% EP SOLN
12.0000 mL/h | EPIDURAL | Status: DC | PRN
  Administered 2021-09-12: 23:00:00 12 mL/h via EPIDURAL
  Filled 2021-09-12: qty 250

## 2021-09-12 NOTE — Anesthesia Preprocedure Evaluation (Signed)
Anesthesia Evaluation  Patient identified by MRN, date of birth, ID band Patient awake    Reviewed: Allergy & Precautions, NPO status , Patient's Chart, lab work & pertinent test results  Airway Mallampati: II  TM Distance: >3 FB Neck ROM: Full    Dental  (+) Teeth Intact, Dental Advisory Given   Pulmonary neg pulmonary ROS,    Pulmonary exam normal breath sounds clear to auscultation       Cardiovascular negative cardio ROS Normal cardiovascular exam Rhythm:Regular Rate:Normal     Neuro/Psych negative neurological ROS     GI/Hepatic negative GI ROS, Neg liver ROS,   Endo/Other  Hypothyroidism Obesity   Renal/GU negative Renal ROS     Musculoskeletal negative musculoskeletal ROS (+)   Abdominal   Peds  Hematology negative hematology ROS (+) Plt 279k   Anesthesia Other Findings Day of surgery medications reviewed with the patient.  Reproductive/Obstetrics (+) Pregnancy                             Anesthesia Physical Anesthesia Plan  ASA: 2  Anesthesia Plan: Epidural   Post-op Pain Management:    Induction:   PONV Risk Score and Plan: 2 and Treatment may vary due to age or medical condition  Airway Management Planned: Natural Airway  Additional Equipment:   Intra-op Plan:   Post-operative Plan:   Informed Consent: I have reviewed the patients History and Physical, chart, labs and discussed the procedure including the risks, benefits and alternatives for the proposed anesthesia with the patient or authorized representative who has indicated his/her understanding and acceptance.     Dental advisory given and Interpreter used for interveiw  Plan Discussed with:   Anesthesia Plan Comments: (Patient identified. Risks/Benefits/Options discussed with patient including but not limited to bleeding, infection, nerve damage, paralysis, failed block, incomplete pain control,  headache, blood pressure changes, nausea, vomiting, reactions to medication both or allergic, itching and postpartum back pain. Confirmed with bedside nurse the patient's most recent platelet count. Confirmed with patient that they are not currently taking any anticoagulation, have any bleeding history or any family history of bleeding disorders. Patient expressed understanding and wished to proceed. All questions were answered. )        Anesthesia Quick Evaluation

## 2021-09-12 NOTE — Anesthesia Procedure Notes (Signed)
Epidural Patient location during procedure: OB Start time: 09/12/2021 10:24 PM End time: 09/12/2021 10:31 PM  Staffing Anesthesiologist: Cecile Hearing, MD Performed: anesthesiologist   Preanesthetic Checklist Completed: patient identified, IV checked, risks and benefits discussed, monitors and equipment checked, pre-op evaluation and timeout performed  Epidural Patient position: sitting Prep: DuraPrep Patient monitoring: blood pressure and continuous pulse ox Approach: midline Location: L3-L4 Injection technique: LOR air  Needle:  Needle type: Tuohy  Needle gauge: 17 G Needle length: 9 cm Needle insertion depth: 5 cm Catheter size: 19 Gauge Catheter at skin depth: 10 cm Test dose: negative and Other (1% Lidocaine)  Additional Notes Patient identified.  Risk benefits discussed including failed block, incomplete pain control, headache, nerve damage, paralysis, blood pressure changes, nausea, vomiting, reactions to medication both toxic or allergic, and postpartum back pain.  Patient expressed understanding and wished to proceed.  All questions were answered.  Sterile technique used throughout procedure and epidural site dressed with sterile barrier dressing. No paresthesia or other complications noted. The patient did not experience any signs of intravascular injection such as tinnitus or metallic taste in mouth nor signs of intrathecal spread such as rapid motor block. Please see nursing notes for vital signs. Reason for block:procedure for pain

## 2021-09-12 NOTE — H&P (Signed)
OBSTETRIC ADMISSION HISTORY AND PHYSICAL  Jessica Robbins is a 20 y.o. female [phone] with IUP at [redacted]w[redacted]d by LMP presenting for IOL.   Reports fetal movement. Denies vaginal bleeding.  She received her prenatal care at  [facility] .  Support person in labor: Husband  Ultrasounds Anatomy U/S: Mild RT ventriculomegaly  Prenatal History/Complications: History of IUFD;  Language barrier;  Hypothyroidism;  Anemia affecting pregnancy in third trimester-folate/b12 and Fe deficiency;  Polyhydramnios affecting pregnancy;   OB BOX: Nursing Staff Provider  Office Location  [facility] Dating   LMP  Language   Dari Anatomy US  Mild RT ventriculomegaly  Flu Vaccine  07/19/21 Genetic/Carrier Screen  NIPS:   Low Risk female AFP:    Horizon:  Negatvie 4/4  TDaP Vaccine   declined  Hgb A1C or  GTT Early  Third trimester Nml 2 hr GTT  COVID Vaccine  x2   LAB RESULTS   Rhogam  n/a Blood Type O/Positive/-- (06/20 1031)   Baby Feeding Plan  Breast Antibody Negative (06/20 1031)  Contraception Undecided  Rubella 26.50 (06/20 1031)  Circumcision Boy-Yes RPR Non Reactive (06/20 1031)   Pediatrician  [facility] HBsAg Negative (06/20 1031)   Support Person   Sponsor and maybe husband or sister HCVAb Negative 02-27-21  Prenatal Classes  HIV Non Reactive (06/20 1031)     BTL Consent  GBS  Negative (For PCN allergy, check sensitivities)   VBAC Consent  Pap        DME Rx [x ] BP cuff to be determined [ ]  Weight Scale PHQ9 & GAD7 [x  ] new OB [ x ] 28 weeks  [x  ] 36 weeks Induction  [ ]  Orders Entered [ ] Foley Y/N   Past Medical History: Past Medical History:  Diagnosis Date   Hyperthyroidism     Past Surgical History: Past Surgical History:  Procedure Laterality Date   NO PAST SURGERIES      Obstetrical History: OB History     Gravida  4   Para  3   Term  2   Preterm  1   AB  0   Living  2      SAB  0   IAB  0   Ectopic  0   Multiple  0   Live Births  2           Social  History: Social History   Socioeconomic History   Marital status: Married    Spouse name: Not on file   Number of children: 2   Years of education: Not on file   Highest education level: Not on file  Occupational History   Not on file  Tobacco Use   Smoking status: Never   Smokeless tobacco: Never  Vaping Use   Vaping Use: Never used  Substance and Sexual Activity   Alcohol use: Never   Drug use: Never   Sexual activity: Yes    Birth control/protection: None    Comment: thinks she had injection  Other Topics Concern   Not on file  Social History Narrative   Not on file   Social Determinants of Health   Financial Resource Strain: Not on file  Food Insecurity: No Food Insecurity   Worried About Running Out of Food in the Last Year: Never true   Ran Out of Food in the Last Year: Never true  Transportation Needs: No Transportation Needs   Lack of Transportation (Medical): No   Lack of Transportation (  Non-Medical): No  Physical Activity: Not on file  Stress: Not on file  Social Connections: Not on file    Family History: Family History  Problem Relation Age of Onset   Cancer Neg Hx    Diabetes Neg Hx    Hypertension Neg Hx     Allergies: No Known Allergies  Medications Prior to Admission  Medication Sig Dispense Refill Last Dose   Prenatal Vit-Fe Fumarate-FA (MULTIVITAMIN-PRENATAL) 27-0.8 MG TABS tablet TAKE 1 TABLET BY MOUTH DAILY AT 12 NOON. 90 tablet 1 09/11/2021   DODEX 1000 MCG/ML injection    More than a month   ferrous sulfate (FERROUSUL) 325 (65 FE) MG tablet Take 1 tablet (325 mg total) by mouth every other day. 30 tablet 3 More than a month   folic acid (FOLVITE) 1 MG tablet Take 1 tablet (1 mg total) by mouth daily. 30 tablet 10 More than a month     Review of Systems  All systems reviewed and negative except as stated in HPI  Blood pressure 126/71, pulse 80, resp. rate 16, last menstrual period 12/13/2020. General appearance: alert, cooperative,  and no distress Lungs: no respiratory distress Heart: regular rate  Abdomen: soft, non-tender; gravid b Pelvic: deferred, RN just checked patient 4/70/0 Extremities: Homans sign is negative, no sign of DVT Presentation: cephalic Fetal monitoring: Category 1, 140, moderate, 15x15, no decels  Uterine activity: q 2-3 mins    Prenatal labs: ABO, Rh: --/--/PENDING (12/27 1830) Antibody: PENDING (12/27 1830) Rubella: 26.50 (06/20 1031) RPR: Non Reactive (10/11 0857)  HBsAg: Negative (06/20 1031)  HIV: Non Reactive (10/11 0857)  GBS: Negative/-- (12/08 1551)  Glucola: Normal Genetic screening:  Negative  Prenatal Transfer Tool  Maternal Diabetes: No Genetic Screening: Normal Maternal Ultrasounds/Referrals: Other: mild RT ventriculomegaly (1.2 cm dilated) Fetal Ultrasounds or other Referrals:  None Maternal Substance Abuse:  No Significant Maternal Medications:  None Significant Maternal Lab Results: Group B Strep negative  Results for orders placed or performed during the hospital encounter of 09/12/21 (from the past 24 hour(s))  Type and screen   Collection Time: 09/12/21  6:30 PM  Result Value Ref Range   ABO/RH(D) PENDING    Antibody Screen PENDING    Sample Expiration      09/15/2021,2359 Performed at [facility], [address] [place], [place] [address]   CBC   Collection Time: 09/12/21  6:36 PM  Result Value Ref Range   WBC 8.9 4.0 - 10.5 K/uL   RBC 4.59 3.87 - 5.11 MIL/uL   Hemoglobin 12.0 12.0 - 15.0 g/dL   HCT 68.3 (L) 41.9 - 62.2 %   MCV 77.6 (L) 80.0 - 100.0 fL   MCH 26.1 26.0 - 34.0 pg   MCHC 33.7 30.0 - 36.0 g/dL   RDW 29.7 (H) 98.9 - 21.1 %   Platelets 279 150 - 400 K/uL   nRBC 0.0 0.0 - 0.2 %    Patient Active Problem List   Diagnosis Date Noted   Encounter for induction of labor 09/12/2021   Iron deficiency anemia of mother during pregnancy 08/23/2021   Polyhydramnios affecting pregnancy 08/02/2021   Anemia affecting pregnancy in third  trimester-folate/b12 and Fe deficiency 06/28/2021   Hypothyroidism 04/14/2021   Supervision of high risk pregnancy, antepartum 02/27/2021   History of IUFD 02/27/2021   Language barrier 02/27/2021    Assessment/Plan:  Jessica Robbins is a 20 y.o. [id number] at [redacted]w[redacted]d here for IOL for   Labor: on pitocin  -- pain  control: planning epidural   Fetal Wellbeing: EFW 7lbs by Leopold's. Cephalic by leopold's.  -- GBS (Neg) -- continuous fetal monitoring - category 1   Postpartum Planning -- breast/desires circ  -- IP Nexplanon for contraception  Thressa Sheller DNP, CNM  09/12/21  9:18 PM

## 2021-09-13 ENCOUNTER — Encounter (HOSPITAL_COMMUNITY): Payer: Self-pay | Admitting: Obstetrics and Gynecology

## 2021-09-13 DIAGNOSIS — O403XX0 Polyhydramnios, third trimester, not applicable or unspecified: Secondary | ICD-10-CM | POA: Diagnosis not present

## 2021-09-13 DIAGNOSIS — Z3A39 39 weeks gestation of pregnancy: Secondary | ICD-10-CM

## 2021-09-13 DIAGNOSIS — Z30017 Encounter for initial prescription of implantable subdermal contraceptive: Secondary | ICD-10-CM

## 2021-09-13 DIAGNOSIS — O99284 Endocrine, nutritional and metabolic diseases complicating childbirth: Secondary | ICD-10-CM | POA: Diagnosis not present

## 2021-09-13 DIAGNOSIS — O09293 Supervision of pregnancy with other poor reproductive or obstetric history, third trimester: Secondary | ICD-10-CM | POA: Diagnosis not present

## 2021-09-13 LAB — RPR: RPR Ser Ql: NONREACTIVE

## 2021-09-13 MED ORDER — COCONUT OIL OIL: 1.0000 "application " | TOPICAL_OIL | Status: DC | PRN

## 2021-09-13 MED ORDER — ACETAMINOPHEN 325 MG PO TABS
650.0000 mg | ORAL_TABLET | ORAL | Status: DC | PRN
  Administered 2021-09-13: 09:00:00 650 mg via ORAL
  Filled 2021-09-13: qty 2

## 2021-09-13 MED ORDER — FOLIC ACID 1 MG PO TABS
1.0000 mg | ORAL_TABLET | Freq: Every day | ORAL | Status: DC
  Administered 2021-09-13 – 2021-09-15 (×3): 1 mg via ORAL
  Filled 2021-09-13 (×3): qty 1

## 2021-09-13 MED ORDER — ONDANSETRON HCL 4 MG/2ML IJ SOLN: 4.0000 mg | INTRAMUSCULAR | Status: DC | PRN

## 2021-09-13 MED ORDER — WITCH HAZEL-GLYCERIN EX PADS: 1.0000 "application " | MEDICATED_PAD | CUTANEOUS | Status: DC | PRN

## 2021-09-13 MED ORDER — IBUPROFEN 600 MG PO TABS
600.0000 mg | ORAL_TABLET | Freq: Four times a day (QID) | ORAL | Status: DC
  Administered 2021-09-13 – 2021-09-15 (×10): 600 mg via ORAL
  Filled 2021-09-13 (×10): qty 1

## 2021-09-13 MED ORDER — ETONOGESTREL 68 MG ~~LOC~~ IMPL
68.0000 mg | DRUG_IMPLANT | Freq: Once | SUBCUTANEOUS | Status: AC
  Administered 2021-09-13: 14:00:00 68 mg via SUBCUTANEOUS
  Filled 2021-09-13: qty 1

## 2021-09-13 MED ORDER — OXYCODONE HCL 5 MG PO TABS: 5.0000 mg | ORAL_TABLET | ORAL | Status: DC | PRN

## 2021-09-13 MED ORDER — ZOLPIDEM TARTRATE 5 MG PO TABS: 5.0000 mg | ORAL_TABLET | Freq: Every evening | ORAL | Status: DC | PRN

## 2021-09-13 MED ORDER — SIMETHICONE 80 MG PO CHEW: 80.0000 mg | CHEWABLE_TABLET | ORAL | Status: DC | PRN

## 2021-09-13 MED ORDER — ONDANSETRON HCL 4 MG PO TABS: 4.0000 mg | ORAL_TABLET | ORAL | Status: DC | PRN

## 2021-09-13 MED ORDER — SENNOSIDES-DOCUSATE SODIUM 8.6-50 MG PO TABS
2.0000 | ORAL_TABLET | Freq: Every day | ORAL | Status: DC
  Administered 2021-09-14 – 2021-09-15 (×2): 2 via ORAL
  Filled 2021-09-13 (×2): qty 2

## 2021-09-13 MED ORDER — DIBUCAINE (PERIANAL) 1 % EX OINT: 1.0000 "application " | TOPICAL_OINTMENT | CUTANEOUS | Status: DC | PRN

## 2021-09-13 MED ORDER — DIPHENHYDRAMINE HCL 25 MG PO CAPS: 25.0000 mg | ORAL_CAPSULE | Freq: Four times a day (QID) | ORAL | Status: DC | PRN

## 2021-09-13 MED ORDER — LIDOCAINE HCL 1 % IJ SOLN
0.0000 mL | Freq: Once | INTRAMUSCULAR | Status: AC | PRN
Start: 2021-09-13 — End: 2021-09-13
  Administered 2021-09-13: 14:00:00 3 mL via INTRADERMAL
  Filled 2021-09-13: qty 20

## 2021-09-13 MED ORDER — BENZOCAINE-MENTHOL 20-0.5 % EX AERO: 1.0000 "application " | INHALATION_SPRAY | CUTANEOUS | Status: DC | PRN

## 2021-09-13 MED ORDER — PRENATAL MULTIVITAMIN CH
1.0000 | ORAL_TABLET | Freq: Every day | ORAL | Status: DC
  Administered 2021-09-13 – 2021-09-15 (×3): 1 via ORAL
  Filled 2021-09-13 (×3): qty 1

## 2021-09-13 MED ORDER — TETANUS-DIPHTH-ACELL PERTUSSIS 5-2.5-18.5 LF-MCG/0.5 IM SUSY: 0.5000 mL | PREFILLED_SYRINGE | Freq: Once | INTRAMUSCULAR | Status: DC

## 2021-09-13 MED ORDER — FERROUS SULFATE 325 (65 FE) MG PO TABS
325.0000 mg | ORAL_TABLET | ORAL | Status: DC
  Administered 2021-09-13 – 2021-09-15 (×2): 325 mg via ORAL
  Filled 2021-09-13 (×2): qty 1

## 2021-09-13 NOTE — Lactation Note (Addendum)
This note was copied from a baby's chart. Lactation Consultation Note  Patient Name: Jessica Robbins [id number] Date: 09/13/2021 Reason for consult: Initial assessment;Mother's request;Term;Maternal endocrine disorder;Breastfeeding assistance Age:22 hours LC working with Mother with assistance of [name], [name] [phone]. Mom states I"nfant not latching for long given her low milk supply. "  LC provided hand pump and reviewed parts and usage.  Mom also requested to meet with social worker to get nutritional support for home. LC left a message for social worker on vocera and will alert RN , [name] of mothers request.   During visit, NP arrived to continue care. LC will return to assist with latching once called by RN.   LC returned at the end of the NP visit.  LC spoon fed and then latched infant with signs of milk transfer. Father present serving has interpreter.   Plan 1. To feed based on cues 8-12x 24hr period. Mom to offer breasts and look for signs of milk transfer.  2. Mom to offer EBM via bottle with slow flow nipple 5-7 ml per feeding.  3. Mom to use hand pump q 3hrs for 10 min each breast.  4. I and O sheet reviewed.  All questions answered at the end of the visit.   Maternal Data Has patient been taught Hand Expression?: Yes Does the patient have breastfeeding experience prior to this delivery?: Yes How long did the patient breastfeed?: First child 2 years outside [place], second child for 2 months milk supply dried up on way to [place]  Feeding Mother's Current Feeding Choice: Breast Milk  LATCH Score                    Lactation Tools Discussed/Used Tools: Pump;Flanges Flange Size: 24 Breast pump type: Manual Pump Education: Setup, frequency, and cleaning;Milk Storage Reason for Pumping: increase stimulation Pumping frequency: every 3 hrs for 10 min  Interventions Interventions: Breast feeding basics reviewed;Assisted with latch;Skin to skin;Breast  massage;Hand express;Pre-pump if needed;Breast compression;Adjust position;Support pillows;Position options;Expressed milk;Hand pump;Education;Pace feeding;LC Psychologist, educational;Infant Driven Feeding Algorithm education  Discharge Pump: Manual  Consult Status Consult Status: Follow-up Date: 09/14/21 Follow-up type: In-patient    Jessica Robbins  Jessica Robbins 09/13/2021, 2:13 PM

## 2021-09-13 NOTE — Lactation Note (Signed)
This note was copied from a baby's chart. Lactation Consultation Note  Patient Name: Jessica Robbins [id number] Date: 09/13/2021   Age:22 hours  LC visit was attempted, but Mom was sleeping. Lactation to return later.   Lurline Hare [facility] 09/13/2021, 10:38 AM

## 2021-09-13 NOTE — Progress Notes (Addendum)
MOB was referred for history of depression/anxiety. °* Referral screened out by Clinical Social Worker because none of the following criteria appear to apply: °~ History of anxiety/depression during this pregnancy, or of post-partum depression following prior delivery. °~ Diagnosis of anxiety and/or depression within last 3 years °OR °* MOB's symptoms currently being treated with medication and/or therapy. MOB is an established with behavioral health therapist [facility] ([facility]). MOB does not have d xof anxiety but was feeling anxious in 2nd trimester.  No other concerns noted in OB records.  ° °Please contact the Clinical Social Worker if needs arise or by MOB request. ° °MOB's Edinburgh score is 5. ° °Jennier Schissler Boyd-Gilyard, MSW, LCSW °Clinical Social Work °[phone] °

## 2021-09-13 NOTE — Procedures (Signed)
Post-Placental Nexplanon Insertion Procedure Note  Patient was identified. Informed consent was signed, signed copy in chart. A time-out was performed.    The insertion site was identified 8-10 cm (3-4 inches) from the medial epicondyle of the humerus and 3-5 cm (1.25-2 inches) posterior to (below) the sulcus (groove) between the biceps and triceps muscles of the patient's left arm and marked. The site was prepped and draped in the usual sterile fashion. Pt was prepped with alcohol swab and then injected with 3 cc of 1% lidocaine. The site was prepped with betadine. Nexplanon removed form packaging,  Device confirmed in needle, then inserted full length of needle and withdrawn per handbook instructions. Provider and patient verified presence of the implant in the womans arm by palpation. Pt insertion site was covered with steristrips/adhesive bandage and pressure bandage. There was minimal blood loss. Patient tolerated procedure well.  Patient was given post procedure instructions and Nexplanon user card with expiration date. Condoms were recommended for STI prevention. Patient was asked to keep the pressure dressing on for 24 hours to minimize bruising and keep the adhesive bandage on for 3-5 days. The patient verbalized understanding of the plan of care and agrees.   Lot # See MAR Expiration Date see [facility]

## 2021-09-13 NOTE — Discharge Summary (Signed)
Postpartum Discharge Summary    Patient Name: Jessica Robbins DOB: 05/07/2001 MRN: [id number]  Date of admission: 09/12/2021 Delivery date:09/13/2021  Delivering provider: WHITE, Ubaldo Glassing D  Date of discharge: 09/15/2021  Admitting diagnosis: Encounter for induction of labor [Z34.90] Intrauterine pregnancy: [redacted]w[redacted]d    Secondary diagnosis:  Principal Problem:   Encounter for induction of labor Active Problems:   Shoulder dystocia during labor and delivery  Additional problems: n/a    Discharge diagnosis: Term Pregnancy Delivered and Anemia                                              Post partum procedures: Nexplanon placement Augmentation: AROM and Pitocin Complications: None  Hospital course: Induction of Labor With Vaginal Delivery   20y.o. yo [phone] admitted to the hospital 09/12/2021 for induction of labor.  Indication for induction:  polyhydramnios .  Patient had an uncomplicated labor course as follows: Membrane Rupture Time/Date: 11:48 PM ,09/12/2021   Delivery Method:Vaginal, Spontaneous  Episiotomy: None  Lacerations:  None  Details of delivery can be found in separate delivery note.  Patient had a routine postpartum course. Patient is discharged home 09/15/21.  Newborn Data: Birth date:09/13/2021  Birth time:1:22 AM  Gender:Female  Living status:Living  Apgars:8 ,9  Weight:3430 g   Magnesium Sulfate received: No BMZ received: No Rhophylac:N/A MMR:N/A T-DaP: declined Flu: Yes Transfusion:No  Physical exam  Vitals:   09/14/21 0541 09/14/21 1430 09/14/21 1940 09/15/21 0534  BP: 115/67 (!) 117/56 122/76 113/60  Pulse: 79 71 80 92  Resp: _0 Temp: 98 F (36.7 C) 97.9 F (36.6 C) 98.6 F (37 C) 98.2 F (36.8 C)  TempSrc: Oral Oral Oral Oral  SpO2:    100%  Weight:      Height:       General: alert, cooperative, and no distress Lochia: appropriate Uterine Fundus: firm Incision: N/A DVT Evaluation: No evidence of DVT seen on  physical exam. Labs: Lab Results  Component Value Date   WBC 8.9 09/12/2021   HGB 12.0 09/12/2021   HCT 35.6 (L) 09/12/2021   MCV 77.6 (L) 09/12/2021   PLT 279 09/12/2021   CMP Latest Ref Rng & Units 10/19/2020  Glucose 65 - 99 mg/dL 101(H)  BUN 6 - 20 mg/dL 9  Creatinine 0.57 - 1.00 mg/dL 0.54(L)  Sodium 134 - 144 mmol/L 139  Potassium 3.5 - 5.2 mmol/L 4.9  Chloride 96 - 106 mmol/L 101  CO2 20 - 29 mmol/L 20  Calcium 8.7 - 10.2 mg/dL 9.7  Total Protein 6.0 - 8.5 g/dL 8.4  Total Bilirubin 0.0 - 1.2 mg/dL 0.2  Alkaline Phos 44 - 121 IU/L 99  AST 0 - 40 IU/L 19  ALT 0 - 32 IU/L 18   Edinburgh Score: Edinburgh Postnatal Depression Scale Screening Tool 09/13/2021  I have been able to laugh and see the funny side of things. 0  I have looked forward with enjoyment to things. 0  I have blamed myself unnecessarily when things went wrong. 2  I have been anxious or worried for no good reason. 0  I have felt scared or panicky for no good reason. 1  Things have been getting on top of me. 0  I have been so unhappy that I have had difficulty sleeping. 0  I have felt  sad or miserable. 1  I have been so unhappy that I have been crying. 1  The thought of harming myself has occurred to me. 0  Edinburgh Postnatal Depression Scale Total 5     After visit meds:  Allergies as of 09/15/2021   No Known Allergies      Medication List     STOP taking these medications    folic acid 1 MG tablet Commonly known as: FOLVITE       TAKE these medications    Dodex 1000 MCG/ML injection Generic drug: cyanocobalamin   ferrous sulfate 325 (65 FE) MG tablet Commonly known as: FerrouSul Take 1 tablet (325 mg total) by mouth every other day.   ibuprofen 600 MG tablet Commonly known as: ADVIL Take 1 tablet (600 mg total) by mouth every 6 (six) hours.   multivitamin-prenatal 27-0.8 MG Tabs tablet TAKE 1 TABLET BY MOUTH DAILY AT 12 NOON.         Discharge home in stable  condition Infant Feeding: Breast Infant Disposition:home with mother Discharge instruction: per After Visit Summary and Postpartum booklet. Activity: Advance as tolerated. Pelvic rest for 6 weeks.  Diet: routine diet Future Appointments: Future Appointments  Date Time Provider [place]  10/25/2021  8:55 AM Radene Gunning, MD [facility]   Follow up Visit:  [place] at [facility] Follow up.   Specialty: Obstetrics and Gynecology Why: In 4 weeks for a postpartum appt Contact information: [place] [address] [phone]                 Please schedule this patient for a In person postpartum visit in 6 weeks with the following provider: Any provider. Additional Postpartum F/U: Nonw   High risk pregnancy complicated by:  prior history of IUFD, polyhydramnios  Delivery mode:  Vaginal, Spontaneous  Anticipated Birth Control:  Nexplanon placed inpatient   09/15/2021 Wende Mott, CNM

## 2021-09-13 NOTE — Anesthesia Postprocedure Evaluation (Signed)
Anesthesia Post Note  Patient: Jessica Robbins  Procedure(s) Performed: AN AD HOC LABOR EPIDURAL     Patient location during evaluation: Mother Baby Anesthesia Type: Epidural Level of consciousness: awake, oriented and awake and alert Pain management: pain level not controlled (cramping pain. RN to check to see if she can receive tylenol and also see if heat packs are availlable. Already receiving ibuprophen) Vital Signs Assessment: post-procedure vital signs reviewed and stable Respiratory status: spontaneous breathing, respiratory function stable and nonlabored ventilation Cardiovascular status: stable Postop Assessment: no headache, adequate PO intake, patient able to bend at knees, able to ambulate and no apparent nausea or vomiting Anesthetic complications: no   No notable events documented.  Last Vitals:  Vitals:   09/13/21 0345 09/13/21 0445  BP: 136/69 121/67  Pulse: 70 88  Resp: 18 18  Temp: 36.6 C 36.6 C  SpO2: 100% 98%    Last Pain:  Vitals:   09/13/21 0845  TempSrc:   PainSc: 3    Pain Goal:                   [name]

## 2021-09-14 MED ORDER — TRIAMCINOLONE ACETONIDE 0.1 % EX CREA
TOPICAL_CREAM | Freq: Two times a day (BID) | CUTANEOUS | Status: DC | PRN
  Filled 2021-09-14: qty 15

## 2021-09-14 NOTE — Progress Notes (Signed)
CSW received consult for hx needed community resources and MH hx. CSW met with MOB to offer support and complete assessment.    When CSW arrived, MOB was bonding with infant as evidence by engaging in skin to skin. FOB also came in during the assessment and MOB provided verbal consent to allow FOB to remain in the room while CSW completed clinical assessment.  FOB appeared to be a support to MOB and he was engaged during the assessment.  MOB soft spoken, but polite and receptive to meeting with CSW.   CSW asked  about MOB MH hx.  MOB acknowledged feeling overly anxious during pregnancy and reported that her symptoms have subsided and she in no longer concerned about her MH.   CSW provided education regarding the baby blues period vs. perinatal mood disorders, discussed treatment and gave resources for mental health follow up if concerns arise.  CSW recommends self-evaluation during the postpartum time period using the New Mom Checklist from Postpartum Progress and encouraged MOB to contact a medical professional if symptoms are noted at any time.  MOB presented with insight and awareness and did not demonstrate any acute MH symptoms.  MOB communicated having a good support team and shared feeling comfortable seeking help if needed.  CSW assessed for safety and MOB denied SI and HI.  MOB was accepting of community resources for outpatient counseling.   CSW provided family with a free car seat and pack n play (hospital program). The couple expressed gratitude.   CSW also provided the family with information to add infant to their food stamps and [facility] application.  CSW identifies no further need for intervention and no barriers to discharge at this time.   Laurey Arrow, MSW, LCSW Clinical Social Work [phone]

## 2021-09-14 NOTE — Progress Notes (Signed)
CSW completed chart review and attempted to meet with MOB at her bedside in room 413.  When CSW arrived, MOB was asleep and was not easily awaken. CSW will attempt to visit with MOB at a later time.   Blaine Hamper, MSW, LCSW Clinical Social Work [phone]

## 2021-09-14 NOTE — Progress Notes (Addendum)
Post Partum Day 1 Subjective: Complains of pain at site of Nexplanon insertion. Denies fever, chills, bleeding, redness or purulent drainage. Up ad lib, voiding, and tolerating PO  [name] used.   Objective: Blood pressure 115/67, pulse 79, temperature 98 F (36.7 C), temperature source Oral, resp. rate 18, height 5\' 2"  (1.575 m), weight 76.4 kg, last menstrual period 12/13/2020, SpO2 99 %, unknown if currently breastfeeding.  Physical Exam:  General: alert, cooperative, no distress, and mildly obese Lochia: appropriate Uterine Fundus: firm Incision: NA DVT Evaluation: No evidence of DVT seen on physical exam.  Left arm: Mild bruising. No bleeding, erythema, drainage or warmth.   Recent Labs    09/12/21 1836  HGB 12.0  HCT 35.6*    Assessment/Plan: Plan for discharge tomorrow, Breastfeeding, Circumcision prior to discharge. Pt and spouse consented. See note on baby's chart. Contraception Nexplanon done, healing well. No Sxs infection. Recommend IBU and ice.   LOS: 2 days   09/14/21 09/14/2021, 11:07 AM

## 2021-09-15 MED ORDER — IBUPROFEN 600 MG PO TABS: 600.0000 mg | ORAL_TABLET | Freq: Four times a day (QID) | ORAL | 0 refills | Status: DC

## 2021-09-15 NOTE — Lactation Note (Signed)
This note was copied from a baby's chart. Lactation Consultation Note  Patient Name: Jessica Robbins IRCVE'L Date: 09/15/2021 Reason for consult: Follow-up assessment Age:22 hours  Ex BF latched baby when LC entered room. Provided pillows for support to bring baby to breast height. Noted frequent swallows. Reviewed engorgement care and monitoring voids/stools.      Feeding Mother's Current Feeding Choice: Breast Milk  LATCH Score Latch: Grasps breast easily, tongue down, lips flanged, rhythmical sucking.  Audible Swallowing: Spontaneous and intermittent  Type of Nipple: Everted at rest and after stimulation  Comfort (Breast/Nipple): Soft / non-tender  Hold (Positioning): Assistance needed to correctly position infant at breast and maintain latch.  LATCH Score: 9    Interventions Interventions: Breast feeding basics reviewed;Support pillows;Education  Discharge Discharge Education: Engorgement and breast care;Warning signs for feeding baby  Consult Status Consult Status: Complete Date: 09/15/21    Dahlia Byes [facility] 09/15/2021, 9:20 AM

## 2021-09-26 ENCOUNTER — Telehealth (HOSPITAL_COMMUNITY): Payer: Self-pay | Admitting: *Deleted

## 2021-09-26 NOTE — Telephone Encounter (Signed)
Attempted hospital discharge follow-up call with [address] Edrick Kins [phone]. No answer received. Deforest Hoyles, RN, 09/26/21, [phone]

## 2021-09-27 ENCOUNTER — Encounter (HOSPITAL_COMMUNITY): Payer: Self-pay | Admitting: Obstetrics & Gynecology

## 2021-09-27 ENCOUNTER — Other Ambulatory Visit: Payer: Self-pay

## 2021-09-27 ENCOUNTER — Inpatient Hospital Stay (HOSPITAL_COMMUNITY)
Admission: AD | Admit: 2021-09-27 | Discharge: 2021-09-27 | Disposition: A | Discharge status: Home / Self Care | Payer: Medicaid Other | Attending: Obstetrics & Gynecology | Admitting: Obstetrics & Gynecology

## 2021-09-27 DIAGNOSIS — O9089 Other complications of the puerperium, not elsewhere classified: Secondary | ICD-10-CM | POA: Insufficient documentation

## 2021-09-27 DIAGNOSIS — O139 Gestational [pregnancy-induced] hypertension without significant proteinuria, unspecified trimester: Secondary | ICD-10-CM | POA: Diagnosis not present

## 2021-09-27 DIAGNOSIS — R519 Headache, unspecified: Secondary | ICD-10-CM | POA: Diagnosis not present

## 2021-09-27 DIAGNOSIS — R030 Elevated blood-pressure reading, without diagnosis of hypertension: Secondary | ICD-10-CM | POA: Diagnosis not present

## 2021-09-27 DIAGNOSIS — Z8759 Personal history of other complications of pregnancy, childbirth and the puerperium: Secondary | ICD-10-CM

## 2021-09-27 DIAGNOSIS — O135 Gestational [pregnancy-induced] hypertension without significant proteinuria, complicating the puerperium: Secondary | ICD-10-CM

## 2021-09-27 LAB — CBC
HCT: 35 % — ABNORMAL LOW (ref 36.0–46.0)
Hemoglobin: 11.5 g/dL — ABNORMAL LOW (ref 12.0–15.0)
MCH: 26.4 pg (ref 26.0–34.0)
MCHC: 32.9 g/dL (ref 30.0–36.0)
MCV: 80.5 fL (ref 80.0–100.0)
Platelets: 367 10*3/uL (ref 150–400)
RBC: 4.35 MIL/uL (ref 3.87–5.11)
RDW: 19.6 % — ABNORMAL HIGH (ref 11.5–15.5)
WBC: 9.2 10*3/uL (ref 4.0–10.5)
nRBC: 0 % (ref 0.0–0.2)

## 2021-09-27 LAB — COMPREHENSIVE METABOLIC PANEL
ALT: 20 U/L (ref 0–44)
AST: 33 U/L (ref 15–41)
Albumin: 3.4 g/dL — ABNORMAL LOW (ref 3.5–5.0)
Alkaline Phosphatase: 86 U/L (ref 38–126)
Anion gap: 7 (ref 5–15)
BUN: 12 mg/dL (ref 6–20)
CO2: 20 mmol/L — ABNORMAL LOW (ref 22–32)
Calcium: 8.6 mg/dL — ABNORMAL LOW (ref 8.9–10.3)
Chloride: 108 mmol/L (ref 98–111)
Creatinine, Ser: 0.65 mg/dL (ref 0.44–1.00)
GFR, Estimated: >60 mL/min (ref >60)
Glucose, Bld: 116 mg/dL — ABNORMAL HIGH (ref 70–99)
Potassium: 4.3 mmol/L (ref 3.5–5.1)
Sodium: 135 mmol/L (ref 135–145)
Total Bilirubin: 1.2 mg/dL (ref 0.3–1.2)
Total Protein: 6.8 g/dL (ref 6.5–8.1)

## 2021-09-27 MED ORDER — ACETAMINOPHEN 325 MG PO TABS: 650.0000 mg | ORAL_TABLET | ORAL | 0 refills | Status: AC | PRN

## 2021-09-27 MED ORDER — ACETAMINOPHEN 500 MG PO TABS
1000.0000 mg | ORAL_TABLET | Freq: Once | ORAL | Status: AC
  Administered 2021-09-27: 1000 mg via ORAL
  Filled 2021-09-27: qty 2

## 2021-09-27 NOTE — MAU Provider Note (Signed)
History     CSN: [id number]  Arrival date and time: 09/27/21 1858   Event Date/Time   First Provider Initiated Contact with Patient 09/27/21 1927      Chief Complaint  Patient presents with   Hypertension   HPI Jessica Robbins is a 21 y.o. [id number] postpartum patient who presents to MAU for blood pressure evaluation. She is s/p vaginal birth on 09/13/2021. Patient had a blood pressure check with an RN from the health department earlier today. She was found to have a blood pressure of 140//85 as well as a headache.  On arrival to MAU patient reports headache, pain score 3-4/10. She believes her Nexplanon is the cause of her headache as it began soon after placement. She has not taken medication for this complaint. She denies visual disturbances, RUQ/epigastric pain, new onset swelling or weight gain.  Patient denies visual or auditory hallucinations when interviewed with interpreter, without sponsor present. She denies SI, HI.  OB History     Gravida  4   Para  4   Term  3   Preterm  1   AB  0   Living  3      SAB  0   IAB  0   Ectopic  0   Multiple  0   Live Births  3           Past Medical History:  Diagnosis Date   Hyperthyroidism     Past Surgical History:  Procedure Laterality Date   NO PAST SURGERIES      Family History  Problem Relation Age of Onset   Cancer Neg Hx    Diabetes Neg Hx    Hypertension Neg Hx     Social History   Tobacco Use   Smoking status: Never   Smokeless tobacco: Never  Vaping Use   Vaping Use: Never used  Substance Use Topics   Alcohol use: Never   Drug use: Never    Allergies: No Known Allergies  Medications Prior to Admission  Medication Sig Dispense Refill Last Dose   acetaminophen (TYLENOL) 500 MG tablet Take 500 mg by mouth every 6 (six) hours as needed.   09/26/2021   DODEX 1000 MCG/ML injection       ferrous sulfate (FERROUSUL) 325 (65 FE) MG tablet Take 1 tablet (325 mg total) by mouth every other  day. 30 tablet 3    ibuprofen (ADVIL) 600 MG tablet Take 1 tablet (600 mg total) by mouth every 6 (six) hours. 30 tablet 0    Prenatal Vit-Fe Fumarate-FA (MULTIVITAMIN-PRENATAL) 27-0.8 MG TABS tablet TAKE 1 TABLET BY MOUTH DAILY AT 12 NOON. 90 tablet 1     Review of Systems  Neurological:  Positive for headaches.  All other systems reviewed and are negative. Physical Exam   Blood pressure 124/74, pulse 92, temperature 98.8 F (37.1 C), resp. rate 16, SpO2 100 %, unknown if currently breastfeeding.  Physical Exam Vitals and nursing note reviewed. Exam conducted with a chaperone present.  Constitutional:      Appearance: Normal appearance.  Cardiovascular:     Rate and Rhythm: Normal rate and regular rhythm.     Pulses: Normal pulses.     Heart sounds: No murmur heard. Pulmonary:     Effort: Pulmonary effort is normal.     Breath sounds: Normal breath sounds.  Neurological:     Mental Status: She is alert.    MAU Course  Procedures  Orders Placed This Encounter  Procedures  CBC   Comprehensive metabolic panel   Discharge patient   Patient Vitals for the past 24 hrs:  BP Temp Pulse Resp SpO2  09/27/21 2015 126/73 -- 79 -- --  09/27/21 2003 132/78 -- 83 -- --  09/27/21 1940 124/74 98.8 F (37.1 C) 92 16 100 %   Results for orders placed or performed during the hospital encounter of 09/27/21 (from the past 24 hour(s))  CBC     Status: Abnormal   Collection Time: 09/27/21  7:28 PM  Result Value Ref Range   WBC 9.2 4.0 - 10.5 K/uL   RBC 4.35 3.87 - 5.11 MIL/uL   Hemoglobin 11.5 (L) 12.0 - 15.0 g/dL   HCT 06.2 (L) 69.4 - 85.4 %   MCV 80.5 80.0 - 100.0 fL   MCH 26.4 26.0 - 34.0 pg   MCHC 32.9 30.0 - 36.0 g/dL   RDW 62.7 (H) 03.5 - 00.9 %   Platelets 367 150 - 400 K/uL   nRBC 0.0 0.0 - 0.2 %  Comprehensive metabolic panel     Status: Abnormal   Collection Time: 09/27/21  7:28 PM  Result Value Ref Range   Sodium 135 135 - 145 mmol/L   Potassium 4.3 3.5 - 5.1 mmol/L    Chloride 108 98 - 111 mmol/L   CO2 20 (L) 22 - 32 mmol/L   Glucose, Bld 116 (H) 70 - 99 mg/dL   BUN 12 6 - 20 mg/dL   Creatinine, Ser 3.81 0.44 - 1.00 mg/dL   Calcium 8.6 (L) 8.9 - 10.3 mg/dL   Total Protein 6.8 6.5 - 8.1 g/dL   Albumin 3.4 (L) 3.5 - 5.0 g/dL   AST 33 15 - 41 U/L   ALT 20 0 - 44 U/L   Alkaline Phosphatase 86 38 - 126 U/L   Total Bilirubin 1.2 0.3 - 1.2 mg/dL   GFR, Estimated >82 >99 mL/min   Anion gap 7 5 - 15   Meds ordered this encounter  Medications   acetaminophen (TYLENOL) tablet 1,000 mg   acetaminophen (TYLENOL) 325 MG tablet    Sig: Take 2 tablets (650 mg total) by mouth every 4 (four) hours as needed for moderate pain or headache.    Dispense:  180 tablet    Refill:  0    Order Specific Question:   Supervising Provider    Answer:   Jaynie Collins A [[id number]]   Assessment and Plan  --21 y.o. [phone] s/p vaginal delivery on 09/13/2021 --Hx GHTN --Normotensive in MAU --PEC labs WNL --Denies headache 30 min after PO Tylenol --Language barrier: Pashto interpreter utilized for initial assessment, [name] utilized for results and discharge discussion --Discharge home in stable condition   F/U: Postpartum visit scheduled for 10/25/2021  Calvert Cantor, CNM 09/28/2021, 7:41 AM

## 2021-09-27 NOTE — MAU Note (Signed)
Pt had svd 09/13/21. Presents to MAU with hypertension. Was seen today by home nurse who called her sponsor with concerns of HTN. Pashtu video interpreter used for Triage. Pt states she has h/a, back pain and abd pain and she thinks is due to her birth control in her arm.

## 2021-09-27 NOTE — Progress Notes (Signed)
Maryelizabeth Kaufmann CNM in earlier to discuss test result and d/c plan. Written and verbal d/c instructions given and understanding voiced. Video interpreter used for d/c instructions. Sponsor, Emilie Rutter, with pt

## 2021-09-28 ENCOUNTER — Inpatient Hospital Stay (HOSPITAL_COMMUNITY)
Admission: AD | Admit: 2021-09-28 | Discharge: 2021-09-29 | Disposition: A | Discharge status: Home / Self Care | Payer: Medicaid Other | Attending: Obstetrics & Gynecology | Admitting: Obstetrics & Gynecology

## 2021-09-28 DIAGNOSIS — G5603 Carpal tunnel syndrome, bilateral upper limbs: Secondary | ICD-10-CM | POA: Diagnosis not present

## 2021-09-28 DIAGNOSIS — Z72820 Sleep deprivation: Secondary | ICD-10-CM | POA: Diagnosis not present

## 2021-09-28 DIAGNOSIS — O9089 Other complications of the puerperium, not elsewhere classified: Secondary | ICD-10-CM | POA: Insufficient documentation

## 2021-09-28 DIAGNOSIS — R29898 Other symptoms and signs involving the musculoskeletal system: Secondary | ICD-10-CM

## 2021-09-28 DIAGNOSIS — R519 Headache, unspecified: Secondary | ICD-10-CM | POA: Diagnosis not present

## 2021-09-28 DIAGNOSIS — M6281 Muscle weakness (generalized): Secondary | ICD-10-CM | POA: Insufficient documentation

## 2021-09-28 DIAGNOSIS — E039 Hypothyroidism, unspecified: Secondary | ICD-10-CM | POA: Diagnosis not present

## 2021-09-28 DIAGNOSIS — R531 Weakness: Secondary | ICD-10-CM | POA: Diagnosis not present

## 2021-09-28 LAB — COMPREHENSIVE METABOLIC PANEL
ALT: 22 U/L (ref 0–44)
AST: 22 U/L (ref 15–41)
Albumin: 3.5 g/dL (ref 3.5–5.0)
Alkaline Phosphatase: 82 U/L (ref 38–126)
Anion gap: 14 (ref 5–15)
BUN: 13 mg/dL (ref 6–20)
CO2: 22 mmol/L (ref 22–32)
Calcium: 9.8 mg/dL (ref 8.9–10.3)
Chloride: 105 mmol/L (ref 98–111)
Creatinine, Ser: 0.67 mg/dL (ref 0.44–1.00)
GFR, Estimated: >60 mL/min (ref >60)
Glucose, Bld: 102 mg/dL — ABNORMAL HIGH (ref 70–99)
Potassium: 4 mmol/L (ref 3.5–5.1)
Sodium: 141 mmol/L (ref 135–145)
Total Bilirubin: 0.2 mg/dL — ABNORMAL LOW (ref 0.3–1.2)
Total Protein: 7 g/dL (ref 6.5–8.1)

## 2021-09-28 LAB — PROTEIN / CREATININE RATIO, URINE
Creatinine, Urine: 49.85 mg/dL
Protein Creatinine Ratio: 1.54 mg/mg{Cre} — ABNORMAL HIGH (ref 0.00–0.15)
Total Protein, Urine: 77 mg/dL

## 2021-09-28 LAB — CBC
HCT: 34.7 % — ABNORMAL LOW (ref 36.0–46.0)
Hemoglobin: 11.7 g/dL — ABNORMAL LOW (ref 12.0–15.0)
MCH: 27 pg (ref 26.0–34.0)
MCHC: 33.7 g/dL (ref 30.0–36.0)
MCV: 80.1 fL (ref 80.0–100.0)
Platelets: 380 10*3/uL (ref 150–400)
RBC: 4.33 MIL/uL (ref 3.87–5.11)
RDW: 19.5 % — ABNORMAL HIGH (ref 11.5–15.5)
WBC: 9.5 10*3/uL (ref 4.0–10.5)
nRBC: 0 % (ref 0.0–0.2)

## 2021-09-28 LAB — URINALYSIS, ROUTINE W REFLEX MICROSCOPIC
Bilirubin Urine: NEGATIVE
Glucose, UA: NEGATIVE mg/dL
Ketones, ur: NEGATIVE mg/dL
Nitrite: NEGATIVE
Protein, ur: 100 mg/dL — AB
Specific Gravity, Urine: 1.02 (ref 1.005–1.030)
pH: 5.5 (ref 5.0–8.0)

## 2021-09-28 LAB — URINALYSIS, MICROSCOPIC (REFLEX)
Bacteria, UA: NONE SEEN
RBC / HPF: >50 RBC/hpf (ref 0–5)

## 2021-09-28 MED ORDER — LACTATED RINGERS IV SOLN: INTRAVENOUS | Status: DC

## 2021-09-28 MED ORDER — BUTALBITAL-APAP-CAFFEINE 50-325-40 MG PO TABS
2.0000 | ORAL_TABLET | ORAL | Status: DC | PRN
  Administered 2021-09-28: 2 via ORAL
  Filled 2021-09-28: qty 2

## 2021-09-28 NOTE — MAU Provider Note (Addendum)
Patient Jessica Robbins is qa 21 y.o.  [phone] at 2 weeks PP here with complaint of HA that has been on-going for 3-4 days. Patient's BP was elevated yesterday for home health nurse. She was seen in MAU yesterday and was treated  successfully. Her blood pressures were normal yesterday in MAU. She denies abnormal vaginal bleeding, abdominal pain, fever, chest pain.    Husband is at the bedside providing interpreter services.  History     CSN: [id number]  Arrival date and time: 09/28/21 1949   Event Date/Time   First Provider Initiated Contact with Patient 09/28/21 2009      Chief Complaint  Patient presents with   Headache   Headache  This is a new problem. The current episode started in the past 7 days. The problem occurs constantly. The problem has been unchanged. The pain does not radiate. The pain quality is similar to prior headaches. Associated symptoms include blurred vision, dizziness, a loss of balance and weakness. Pertinent negatives include no abdominal pain or fever. Nothing aggravates the symptoms. She has tried nothing for the symptoms.  Patient also reporting generalized muscle weakness, FOB reports that he had to carry her to the car. He reports that she would become "unable to move" for 20 minutes at a time.  OB History     Gravida  4   Para  4   Term  3   Preterm  1   AB  0   Living  3      SAB  0   IAB  0   Ectopic  0   Multiple  0   Live Births  3           Past Medical History:  Diagnosis Date   Hyperthyroidism     Past Surgical History:  Procedure Laterality Date   NO PAST SURGERIES      Family History  Problem Relation Age of Onset   Cancer Neg Hx    Diabetes Neg Hx    Hypertension Neg Hx     Social History   Tobacco Use   Smoking status: Never   Smokeless tobacco: Never  Vaping Use   Vaping Use: Never used  Substance Use Topics   Alcohol use: Never   Drug use: Never    Allergies: No Known Allergies  Medications Prior  to Admission  Medication Sig Dispense Refill Last Dose   acetaminophen (TYLENOL) 325 MG tablet Take 2 tablets (650 mg total) by mouth every 4 (four) hours as needed for moderate pain or headache. 180 tablet 0    DODEX 1000 MCG/ML injection       ferrous sulfate (FERROUSUL) 325 (65 FE) MG tablet Take 1 tablet (325 mg total) by mouth every other day. 30 tablet 3    ibuprofen (ADVIL) 600 MG tablet Take 1 tablet (600 mg total) by mouth every 6 (six) hours. 30 tablet 0    Prenatal Vit-Fe Fumarate-FA (MULTIVITAMIN-PRENATAL) 27-0.8 MG TABS tablet TAKE 1 TABLET BY MOUTH DAILY AT 12 NOON. 90 tablet 1     Review of Systems  Constitutional: Negative.  Negative for fever.  HENT: Negative.    Eyes:  Positive for blurred vision.  Respiratory:  Positive for shortness of breath.   Cardiovascular: Negative.   Gastrointestinal:  Negative for abdominal pain.  Genitourinary: Negative.   Neurological:  Positive for dizziness, weakness, headaches and loss of balance.  Hematological: Negative.   Psychiatric/Behavioral: Negative.    Physical Exam   Blood  pressure 123/65, pulse 71, temperature 99.4 F (37.4 C), temperature source Oral, resp. rate 20, unknown if currently breastfeeding.  Physical Exam Constitutional:      Appearance: She is well-developed.  Pulmonary:     Effort: Pulmonary effort is normal.  Abdominal:     Palpations: Abdomen is soft.  Skin:    General: Skin is warm.  Neurological:     General: No focal deficit present.     Mental Status: She is alert.     Sensory: Sensation is intact.     Motor: Weakness present. No abnormal muscle tone.     Coordination: Coordination is intact.     Comments: Equal strength bilaterally in hands and feet    MAU Course  Procedures  MDM Patient is alert and oriented times 3 in bed -consult with Dr. Rory Percy; will order MRI of brain and head -will also give patient Fioricet, IV fluids, CBC and CMP pending -Patient caring for baby and  breastfeeding  Patient care endorsed to [facility] at 2221.   Assessment and Plan    Mervyn Skeeters [facility] 09/28/2021, 8:19 PM   0100:   Message from Radiology to clarify MRI/MRA orders              They will be sending for her soon  MR ANGIO HEAD WO CONTRAST  Result Date: 09/29/2021 CLINICAL DATA:  Initial evaluation for acute headache, currently pregnant. EXAM: MRI HEAD WITHOUT CONTRAST MRA HEAD WITHOUT CONTRAST MRV HEAD WITHOUT CONTRAST TECHNIQUE: Multiplanar, multi-echo pulse sequences of the brain and surrounding structures were acquired without intravenous contrast. Angiographic images of the Circle of Willis were acquired using MRA technique without intravenous contrast. Dedicated MRV images of the head using the standard protocol without IV contrast was performed. COMPARISON:  None available. FINDINGS: MRI HEAD FINDINGS Brain: Examination mildly degraded by motion artifact. Cerebral volume within normal limits. No focal parenchymal signal abnormality. No abnormal foci of restricted diffusion to suggest acute or subacute ischemia. Gray-white matter differentiation maintained. No encephalomalacia to suggest chronic cortical infarction. No foci of susceptibility artifact to suggest acute or chronic intracranial hemorrhage. No mass lesion, midline shift or mass effect. No hydrocephalus or extra-axial fluid collection. Pituitary gland and suprasellar region within normal limits. Midline structures intact and normal. Vascular: Major intracranial vascular flow voids are maintained. 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 Visualized upper cervical spine within normal limits. Bone marrow signal intensity normal. No scalp soft tissue abnormality. Sinuses/Orbits: Globes and orbital soft tissues demonstrate no acute finding. Scattered mucosal thickening noted throughout the ethmoidal air cells and maxillary sinuses. No  air-fluid levels to suggest acute sinusitis. No mastoid effusion. Inner ear structures grossly normal. Other: None. MRA HEAD FINDINGS Anterior circulation: Visualized distal cervical segments of the internal carotid arteries are widely patent with antegrade flow. Petrous, cavernous, and supraclinoid segments patent without stenosis or other abnormality. A1 segments patent bilaterally. Normal anterior communicating artery complex. Anterior cerebral arteries patent without stenosis. No M1 stenosis or occlusion. Normal MCA bifurcations. Distal MCA branches perfused and symmetric. Posterior circulation: Vertebral arteries are largely codominant and widely patent to the vertebrobasilar junction. Left PICA patent. Right PICA not seen. Basilar patent to its distal aspect without stenosis. Dominant right AICA. Superior cerebellar arteries patent bilaterally. Left PCA supplied primarily via the basilar. Right PCA supplied via a hypoplastic right P1 segment and robust right posterior communicating artery. Both PCAs well perfused to their distal aspects. No intracranial aneurysm  or other vascular malformation. Anatomic variants: Predominant fetal type origin of the right PCA. MRV HEAD FINDINGS Normal flow related signal seen throughout the superior sagittal sinus to the torcula. Transverse and sigmoid sinuses are patent as are the visualized proximal internal jugular veins. Straight sinus, vein of Galen, internal cerebral veins, and basal veins of Rosenthal are patent. No evidence for dural venous sinus thrombosis. No appreciable cortical venous thrombosis. IMPRESSION: 1. Normal MRI of the brain. No acute intracranial abnormality identified. 2. Normal intracranial MRA. 3. Normal intracranial MRV. No evidence for dural venous sinus thrombosis. Electronically Signed   By: Jeannine Boga M.D.   On: 09/29/2021 04:08   MR BRAIN WO CONTRAST  Result Date: 09/29/2021 CLINICAL DATA:  Initial evaluation for acute headache,  currently pregnant. EXAM: MRI HEAD WITHOUT CONTRAST MRA HEAD WITHOUT CONTRAST MRV HEAD WITHOUT CONTRAST TECHNIQUE: Multiplanar, multi-echo pulse sequences of the brain and surrounding structures were acquired without intravenous contrast. Angiographic images of the Circle of Willis were acquired using MRA technique without intravenous contrast. Dedicated MRV images of the head using the standard protocol without IV contrast was performed. COMPARISON:  None available. FINDINGS: MRI HEAD FINDINGS Brain: Examination mildly degraded by motion artifact. Cerebral volume within normal limits. No focal parenchymal signal abnormality. No abnormal foci of restricted diffusion to suggest acute or subacute ischemia. Gray-white matter differentiation maintained. No encephalomalacia to suggest chronic cortical infarction. No foci of susceptibility artifact to suggest acute or chronic intracranial hemorrhage. No mass lesion, midline shift or mass effect. No hydrocephalus or extra-axial fluid collection. Pituitary gland and suprasellar region within normal limits. Midline structures intact and normal. Vascular: Major intracranial vascular flow voids are maintained. 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 Visualized upper cervical spine within normal limits. Bone marrow signal intensity normal. No scalp soft tissue abnormality. Sinuses/Orbits: Globes and orbital soft tissues demonstrate no acute finding. Scattered mucosal thickening noted throughout the ethmoidal air cells and maxillary sinuses. No air-fluid levels to suggest acute sinusitis. No mastoid effusion. Inner ear structures grossly normal. Other: None. MRA HEAD FINDINGS Anterior circulation: Visualized distal cervical segments of the internal carotid arteries are widely patent with antegrade flow. Petrous, cavernous, and supraclinoid segments patent without stenosis or other abnormality.  A1 segments patent bilaterally. Normal anterior communicating artery complex. Anterior cerebral arteries patent without stenosis. No M1 stenosis or occlusion. Normal MCA bifurcations. Distal MCA branches perfused and symmetric. Posterior circulation: Vertebral arteries are largely codominant and widely patent to the vertebrobasilar junction. Left PICA patent. Right PICA not seen. Basilar patent to its distal aspect without stenosis. Dominant right AICA. Superior cerebellar arteries patent bilaterally. Left PCA supplied primarily via the basilar. Right PCA supplied via a hypoplastic right P1 segment and robust right posterior communicating artery. Both PCAs well perfused to their distal aspects. No intracranial aneurysm or other vascular malformation. Anatomic variants: Predominant fetal type origin of the right PCA. MRV HEAD FINDINGS Normal flow related signal seen throughout the superior sagittal sinus to the torcula. Transverse and sigmoid sinuses are patent as are the visualized proximal internal jugular veins. Straight sinus, vein of Galen, internal cerebral veins, and basal veins of Rosenthal are patent. No evidence for dural venous sinus thrombosis. No appreciable cortical venous thrombosis. IMPRESSION: 1. Normal MRI of the brain. No acute intracranial abnormality identified. 2. Normal intracranial MRA. 3. Normal intracranial MRV. No evidence for dural venous sinus thrombosis. Electronically Signed   By: Pincus Badder.D.  On: 09/29/2021 04:08   MR Venogram Head  Result Date: 09/29/2021 CLINICAL DATA:  Initial evaluation for acute headache, currently pregnant. EXAM: MRI HEAD WITHOUT CONTRAST MRA HEAD WITHOUT CONTRAST MRV HEAD WITHOUT CONTRAST TECHNIQUE: Multiplanar, multi-echo pulse sequences of the brain and surrounding structures were acquired without intravenous contrast. Angiographic images of the Circle of Willis were acquired using MRA technique without intravenous contrast. Dedicated MRV  images of the head using the standard protocol without IV contrast was performed. COMPARISON:  None available. FINDINGS: MRI HEAD FINDINGS Brain: Examination mildly degraded by motion artifact. Cerebral volume within normal limits. No focal parenchymal signal abnormality. No abnormal foci of restricted diffusion to suggest acute or subacute ischemia. Gray-white matter differentiation maintained. No encephalomalacia to suggest chronic cortical infarction. No foci of susceptibility artifact to suggest acute or chronic intracranial hemorrhage. No mass lesion, midline shift or mass effect. No hydrocephalus or extra-axial fluid collection. Pituitary gland and suprasellar region within normal limits. Midline structures intact and normal. Vascular: Major intracranial vascular flow voids are maintained. 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 Visualized upper cervical spine within normal limits. Bone marrow signal intensity normal. No scalp soft tissue abnormality. Sinuses/Orbits: Globes and orbital soft tissues demonstrate no acute finding. Scattered mucosal thickening noted throughout the ethmoidal air cells and maxillary sinuses. No air-fluid levels to suggest acute sinusitis. No mastoid effusion. Inner ear structures grossly normal. Other: None. MRA HEAD FINDINGS Anterior circulation: Visualized distal cervical segments of the internal carotid arteries are widely patent with antegrade flow. Petrous, cavernous, and supraclinoid segments patent without stenosis or other abnormality. A1 segments patent bilaterally. Normal anterior communicating artery complex. Anterior cerebral arteries patent without stenosis. No M1 stenosis or occlusion. Normal MCA bifurcations. Distal MCA branches perfused and symmetric. Posterior circulation: Vertebral arteries are largely codominant and widely patent to the vertebrobasilar junction. Left PICA patent.  Right PICA not seen. Basilar patent to its distal aspect without stenosis. Dominant right AICA. Superior cerebellar arteries patent bilaterally. Left PCA supplied primarily via the basilar. Right PCA supplied via a hypoplastic right P1 segment and robust right posterior communicating artery. Both PCAs well perfused to their distal aspects. No intracranial aneurysm or other vascular malformation. Anatomic variants: Predominant fetal type origin of the right PCA. MRV HEAD FINDINGS Normal flow related signal seen throughout the superior sagittal sinus to the torcula. Transverse and sigmoid sinuses are patent as are the visualized proximal internal jugular veins. Straight sinus, vein of Galen, internal cerebral veins, and basal veins of Rosenthal are patent. No evidence for dural venous sinus thrombosis. No appreciable cortical venous thrombosis. IMPRESSION: 1. Normal MRI of the brain. No acute intracranial abnormality identified. 2. Normal intracranial MRA. 3. Normal intracranial MRV. No evidence for dural venous sinus thrombosis. Electronically Signed   By: Jeannine Boga M.D.   On: 09/29/2021 04:08    Discussed results with patient via interpretor  With further questioning, it seems she had had this weakness in her hands and legs even before pregnancy There is mention in her records of hypothyroidism, currently off meds  She was also seen in ED once and they told her she had carpal tunnel syndrome, which could account forher hand weakness.   A:  Postpartum Day #16       Intermittent weakness of hands and legs       Headache, resolved  P:  Discharge home       Rx Fioricet given #14 for use prn  HA       Encouraged her to get some help at home for sleep and meals.  Also recommend she be seen by her primary doctor (husband states she has a primary doctor, and sponsor knows who it is)  Reassured her there is no evidence of acute neurological impairment, also since this does not seem to be an acute  event anyway.  States has had it for a long time  Encouraged to return if she develops worsening of symptoms, increase in pain, fever, or other concerning symptoms.   Seabron Spates, CNM

## 2021-09-28 NOTE — MAU Note (Signed)
PT 'S HUSBAND SAYS HAD VAG DEL ON 12-17 WED - RN CAME TO HOUSE TO CHECK ON BABY- AND CHECKED PT BP- SAYS WAS CREEPING UP .    H/A STARTED YESTERDAY  TOOK TYLENOL YESTERDAY  AT 5 PM TODAY BECAME LETHARGIC  HE SAYS SHOULDN'T WALK  HE CARRIED HER TO THE CAR  BROUGHT FROM LOBBY IN W/C

## 2021-09-29 ENCOUNTER — Inpatient Hospital Stay (HOSPITAL_COMMUNITY): Payer: Medicaid Other

## 2021-09-29 DIAGNOSIS — R531 Weakness: Secondary | ICD-10-CM

## 2021-09-29 DIAGNOSIS — R29898 Other symptoms and signs involving the musculoskeletal system: Secondary | ICD-10-CM

## 2021-09-29 DIAGNOSIS — R519 Headache, unspecified: Secondary | ICD-10-CM

## 2021-09-29 DIAGNOSIS — O9089 Other complications of the puerperium, not elsewhere classified: Secondary | ICD-10-CM | POA: Diagnosis not present

## 2021-09-29 DIAGNOSIS — G5603 Carpal tunnel syndrome, bilateral upper limbs: Secondary | ICD-10-CM

## 2021-09-29 DIAGNOSIS — E039 Hypothyroidism, unspecified: Secondary | ICD-10-CM

## 2021-09-29 DIAGNOSIS — Z72820 Sleep deprivation: Secondary | ICD-10-CM

## 2021-09-29 IMAGING — MR MR MRA HEAD W/O CM
1 series · 20 of 48 positions shown · non-contrast
Comparison: None available.

CLINICAL DATA: Initial evaluation for acute headache, currently
pregnant.

EXAM:
MRI HEAD WITHOUT CONTRAST
MRA HEAD WITHOUT CONTRAST
MRV HEAD WITHOUT CONTRAST
TECHNIQUE: Multiplanar, multi-echo pulse sequences of the brain and surrounding
structures were acquired without intravenous contrast. Angiographic
images of the Circle of Willis were acquired using MRA technique
without intravenous contrast. Dedicated MRV images of the head using
the standard protocol without IV contrast was performed.

[Series 14: 3d cow · axial · 0.5mm · 0.41mm/px · z∈[-69,+11]mm · 20 of 172 slices shown]
[im 1/172]
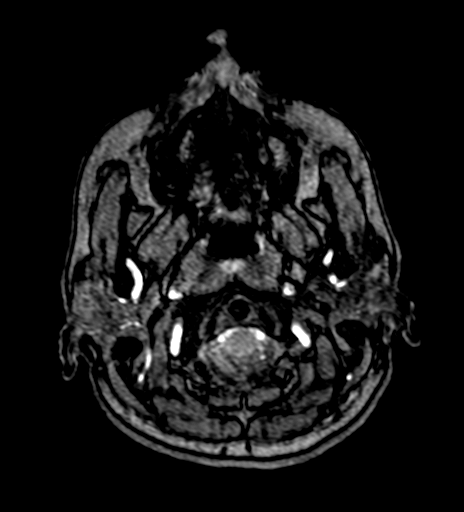
[im 4/172]
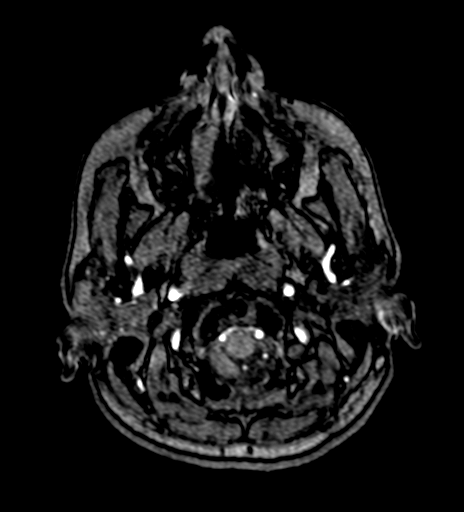
[im 8/172]
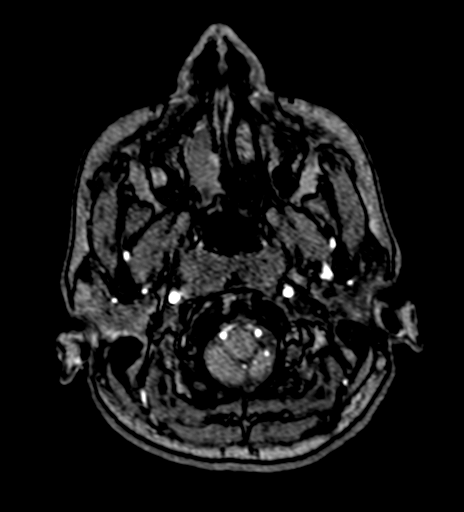
[im 11/172]
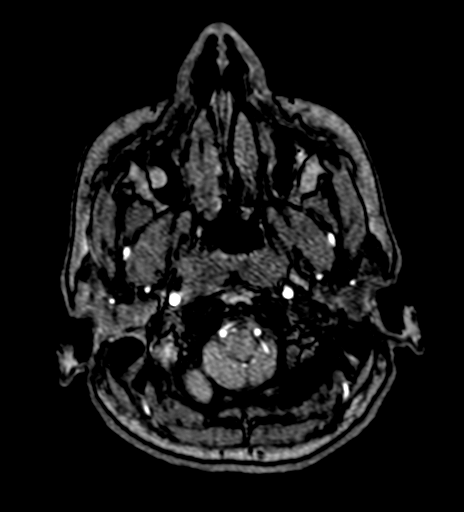
[im 15/172]
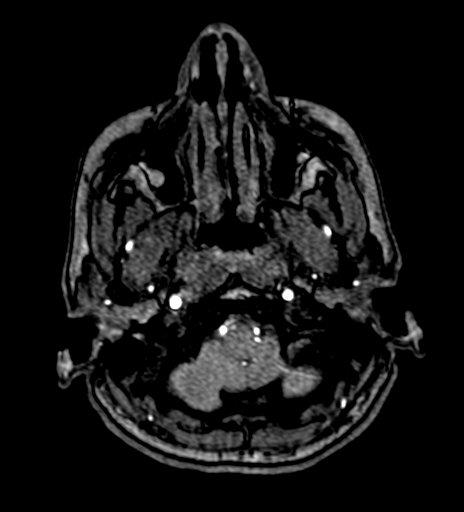
[im 19/172]
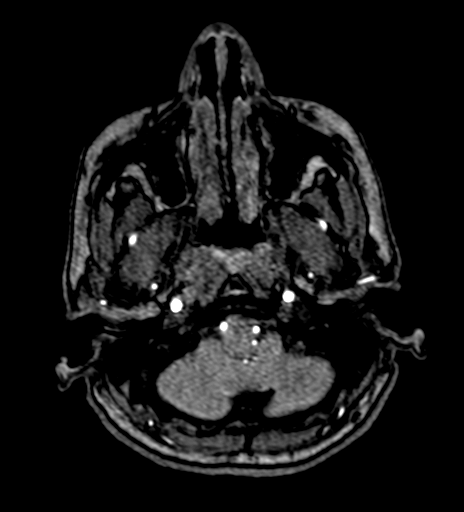
[im 22/172]
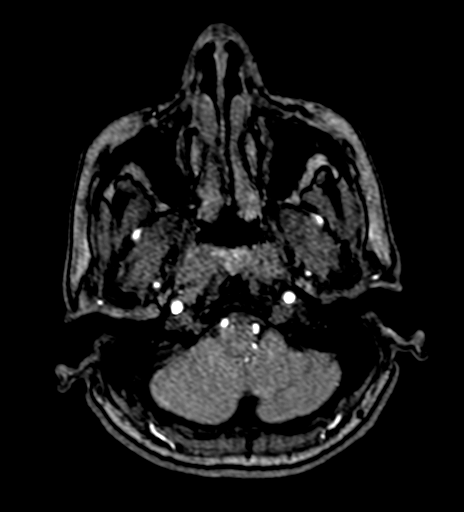
[im 26/172]
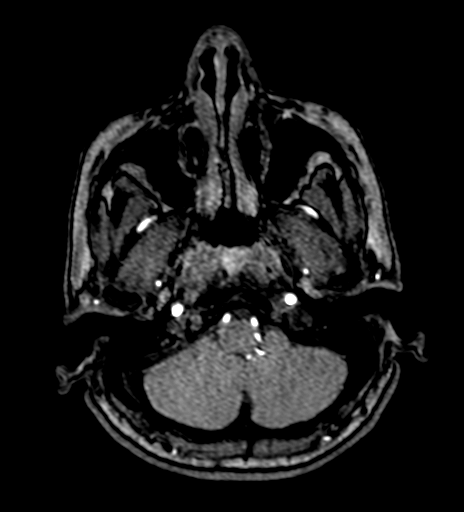
[im 30/172]
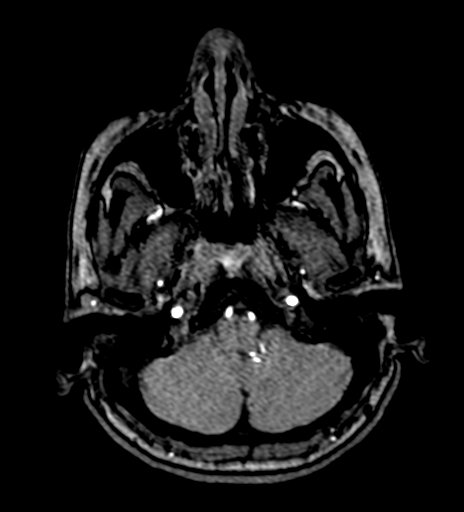
[im 33/172]
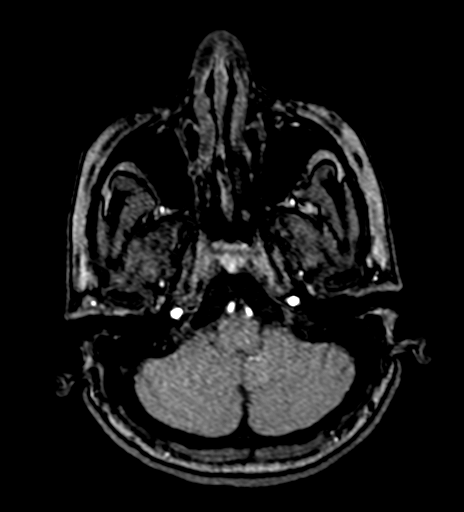
[im 37/172]
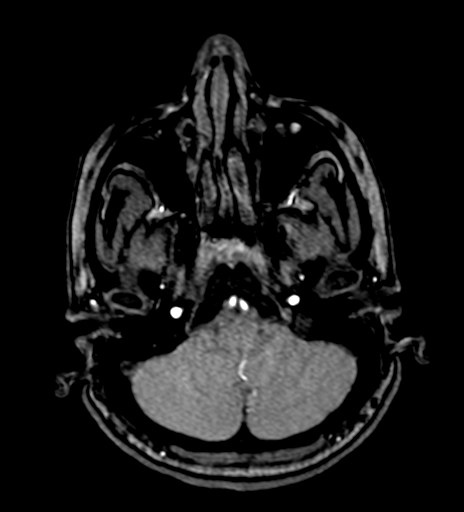
[im 41/172]
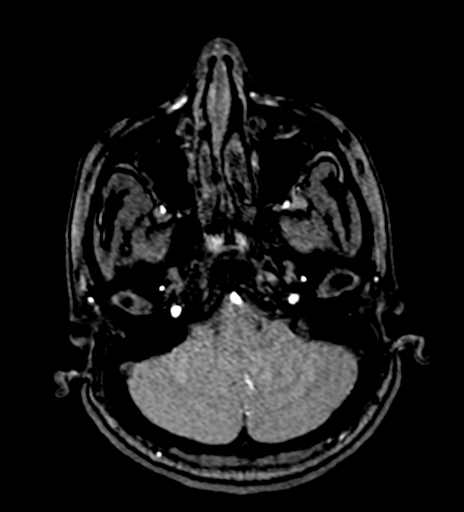
[im 55/172]
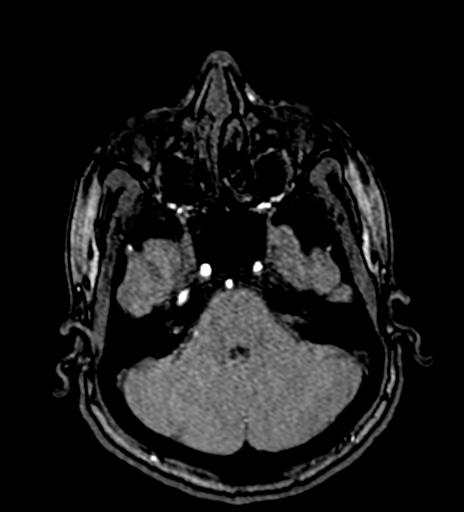
[im 77/172]
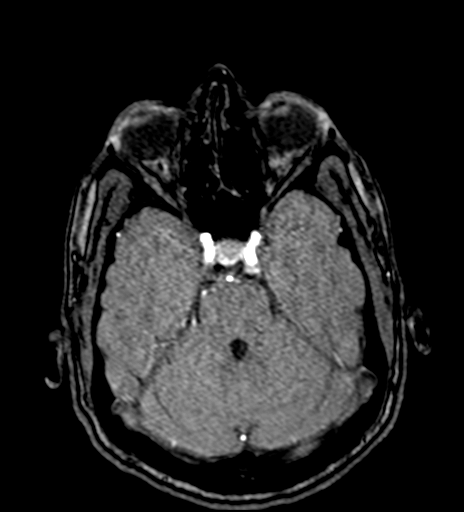
[im 88/172]
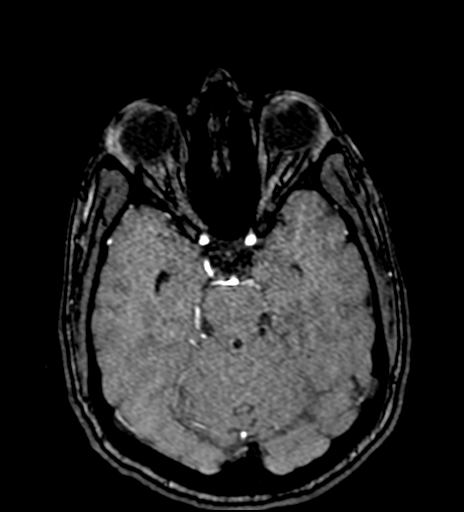
[im 99/172]
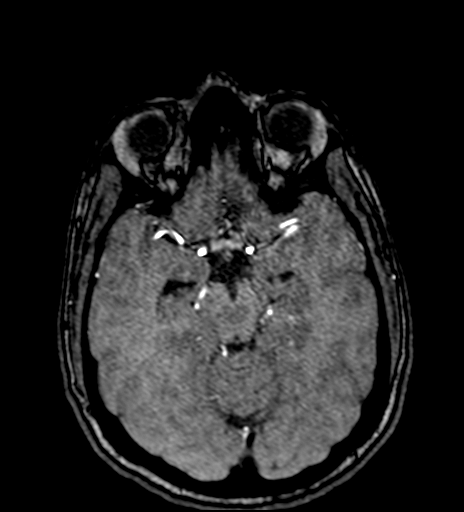
[im 121/172]
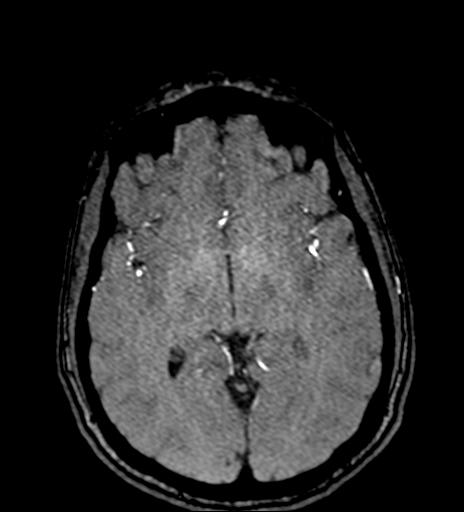
[im 142/172]
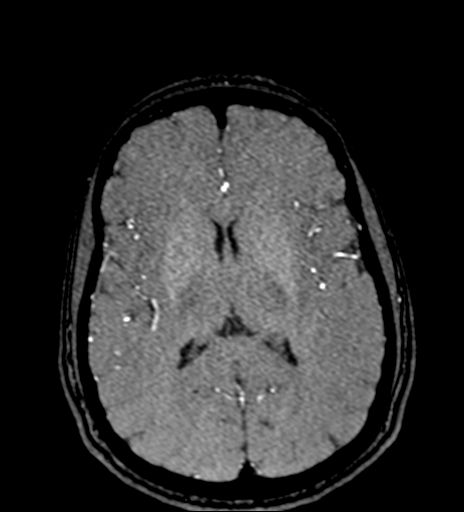
[im 146/172]
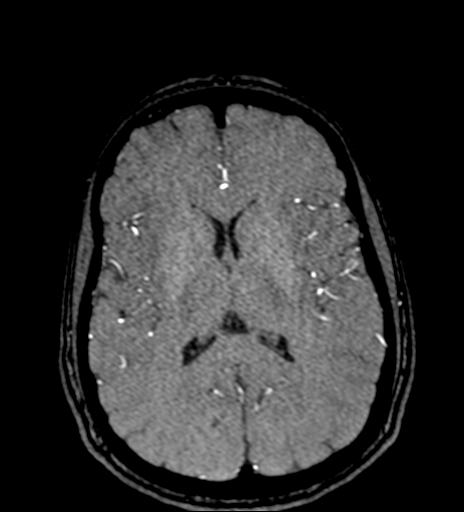
[im 164/172]
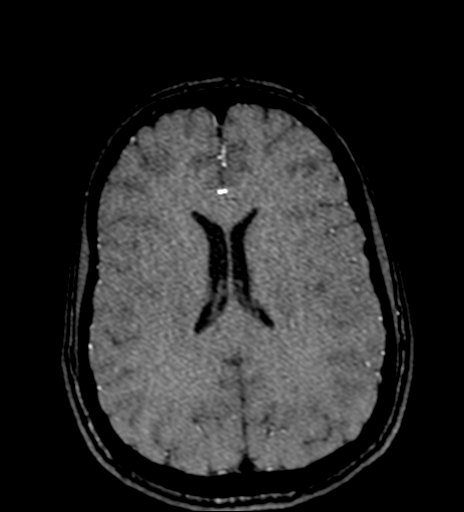

[20 of 48 positions shown; findings below may reference images not displayed]

FINDINGS: MRI HEAD FINDINGS

Brain: Examination mildly degraded by motion artifact.

Cerebral volume within normal limits. No focal parenchymal signal
abnormality. No abnormal foci of restricted diffusion to suggest
acute or subacute ischemia. Gray-white matter differentiation
maintained. No encephalomalacia to suggest chronic cortical
infarction. No foci of susceptibility artifact to suggest acute or
chronic intracranial hemorrhage.

No mass lesion, midline shift or mass effect. No hydrocephalus or
extra-axial fluid collection. Pituitary gland and suprasellar region
within normal limits. Midline structures intact and normal.

Vascular: Major intracranial vascular flow voids are maintained.

Skull and upper cervical spine: Mild cerebellar tonsillar ectopia of
approximately 3-4 mm without frank Chiari malformation. Visualized
upper cervical spine within normal limits. Bone marrow signal
intensity normal. No scalp soft tissue abnormality.

Sinuses/Orbits: Globes and orbital soft tissues demonstrate no acute
finding. Scattered mucosal thickening noted throughout the ethmoidal
air cells and maxillary sinuses. No air-fluid levels to suggest
acute sinusitis. No mastoid effusion. Inner ear structures grossly
normal.

Other: None.

MRA HEAD FINDINGS

Anterior circulation: Visualized distal cervical segments of the
internal carotid arteries are widely patent with antegrade flow.
Petrous, cavernous, and supraclinoid segments patent without
stenosis or other abnormality. A1 segments patent bilaterally.
Normal anterior communicating artery complex. Anterior cerebral
arteries patent without stenosis. No M1 stenosis or occlusion.
Normal MCA bifurcations. Distal MCA branches perfused and symmetric.

Posterior circulation: Vertebral arteries are largely codominant and
widely patent to the vertebrobasilar junction. Left PICA patent.
Right PICA not seen. Basilar patent to its distal aspect without
stenosis. Dominant right AICA. Superior cerebellar arteries patent
bilaterally. Left PCA supplied primarily via the basilar. Right PCA
supplied via a hypoplastic right P1 segment and robust right
posterior communicating artery. Both PCAs well perfused to their
distal aspects.

No intracranial aneurysm or other vascular malformation.

Anatomic variants: Predominant fetal type origin of the right PCA.

MRV HEAD FINDINGS

Normal flow related signal seen throughout the superior sagittal
sinus to the torcula. Transverse and sigmoid sinuses are patent as
are the visualized proximal internal jugular veins. Straight sinus,
vein of DEEQA RAYAAN, internal cerebral veins, and basal veins of DEEQA RAYAAN
are patent. No evidence for dural venous sinus thrombosis. No
appreciable cortical venous thrombosis.
IMPRESSION: 1. Normal MRI of the brain. No acute intracranial abnormality
identified.
2. Normal intracranial MRA.
3. Normal intracranial MRV. No evidence for dural venous sinus
thrombosis.

## 2021-09-29 IMAGING — MR MR HEAD W/O CM
13 of 14 series · 44 of 48 positions shown · non-contrast
Comparison: None available.

CLINICAL DATA: Initial evaluation for acute headache, currently
pregnant.

EXAM:
MRI HEAD WITHOUT CONTRAST
MRA HEAD WITHOUT CONTRAST
MRV HEAD WITHOUT CONTRAST
TECHNIQUE: Multiplanar, multi-echo pulse sequences of the brain and surrounding
structures were acquired without intravenous contrast. Angiographic
images of the Circle of Willis were acquired using MRA technique
without intravenous contrast. Dedicated MRV images of the head using
the standard protocol without IV contrast was performed.

[Series 5: DWI · axial · 3.0mm · 0.88mm/px · z∈[-96,+56]mm · 7 of 105 slices shown (1 of 4)]
[im 1/105]
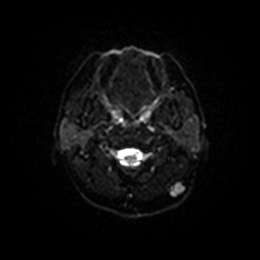
[im 18/105]
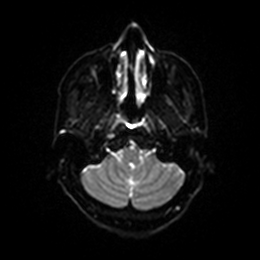
[im 35/105]
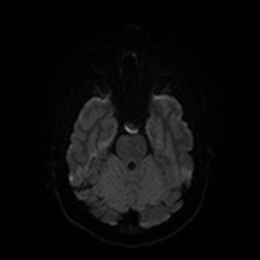
[im 53/105]
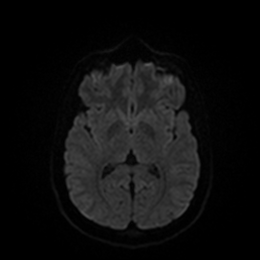
[im 70/105]
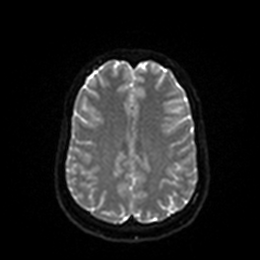
[im 87/105]
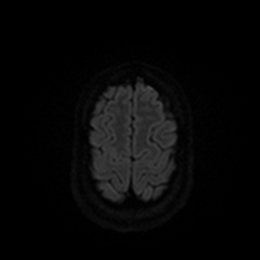
[im 105/105]
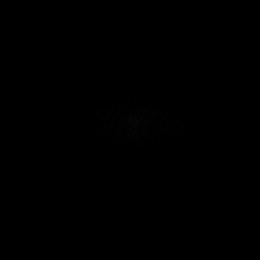

[Series 6: DWI · axial · 3.0mm · 0.88mm/px · z∈[-96,+56]mm · 3 of 50 slices shown (2 of 4)]
[im 1/50]
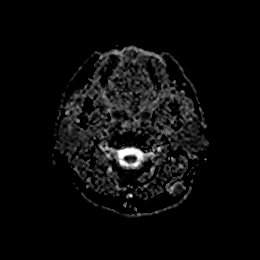
[im 25/50]
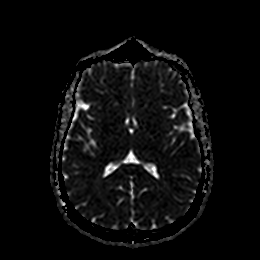
[im 50/50]
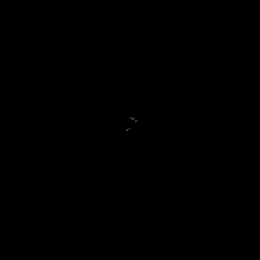

[Series 7: DWI · coronal · 4.0mm · 0.88mm/px · 6 of 76 slices shown (3 of 4)]
[im 1/76]
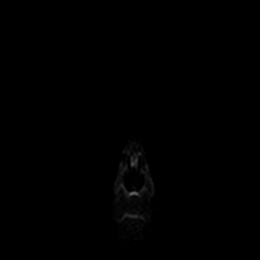
[im 16/76]
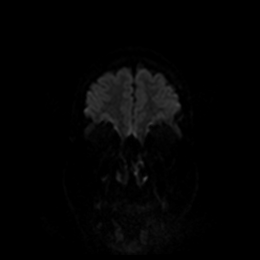
[im 31/76]
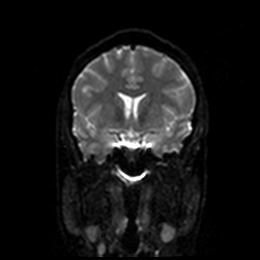
[im 46/76]
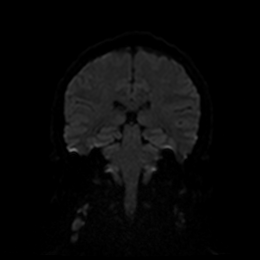
[im 61/76]
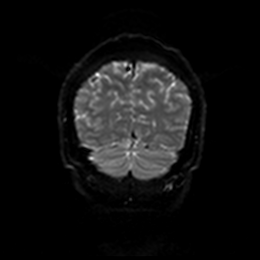
[im 76/76]
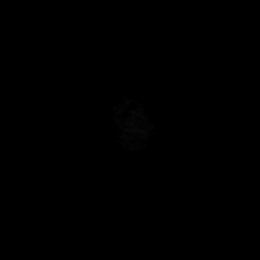

[Series 8: DWI · coronal · 4.0mm · 0.88mm/px · 3 of 38 slices shown (4 of 4)]
[im 1/38]
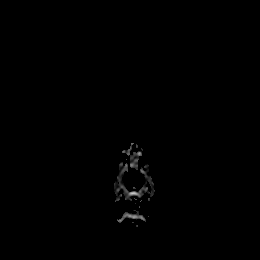
[im 19/38]
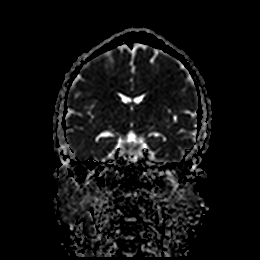
[im 38/38]
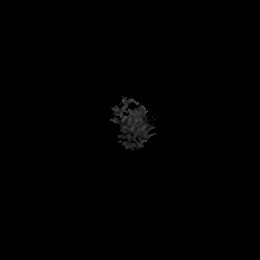

[Series 9: T1 · sagittal · 5.0mm · 0.75mm/px · 2 of 27 slices shown]
[im 1/27]
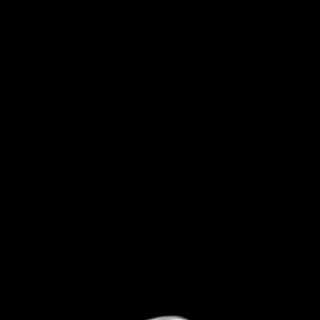
[im 27/27]
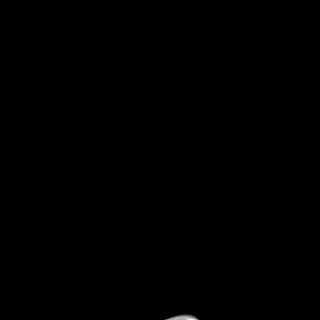

[Series 10: T2 · axial · 5.0mm · 0.72mm/px · z∈[-88,+64]mm · 2 of 27 slices shown (1 of 2)]
[im 1/27]
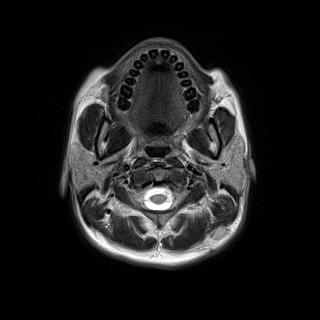
[im 27/27]
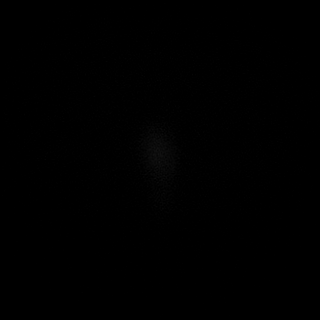

[Series 11: FLAIR · axial · 5.0mm · 0.45mm/px · z∈[-88,+64]mm · 2 of 27 slices shown (1 of 2)]
[im 1/27]
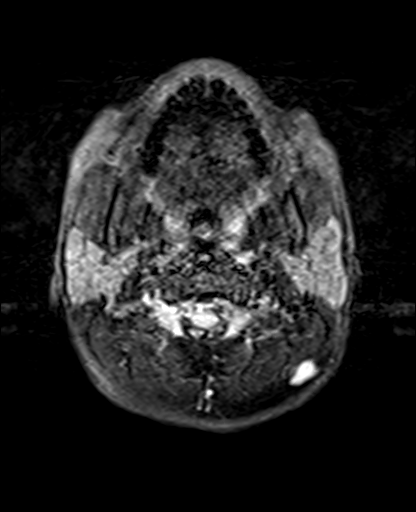
[im 27/27]
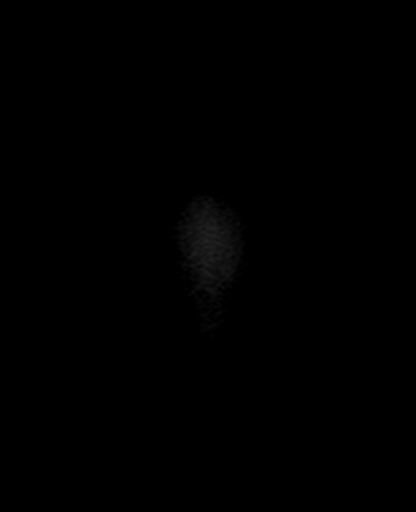

[Series 16: mag_images · axial · 3.0mm · 0.90mm/px · z∈[-92,+69]mm · 4 of 56 slices shown]
[im 1/56]
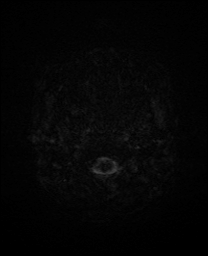
[im 19/56]
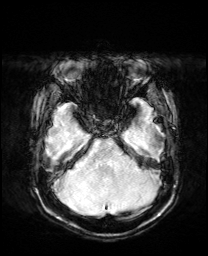
[im 37/56]
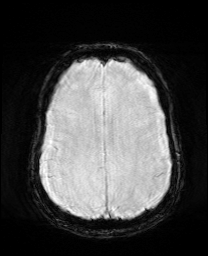
[im 56/56]
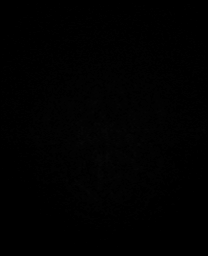

[Series 17: pha_images · axial · 3.0mm · 0.90mm/px · z∈[-92,+57]mm · 4 of 52 slices shown]
[im 1/52]
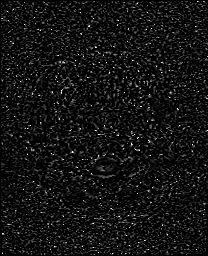
[im 18/52]
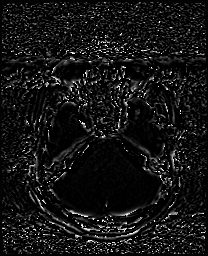
[im 35/52]
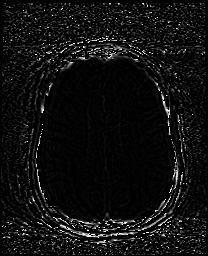
[im 52/52]
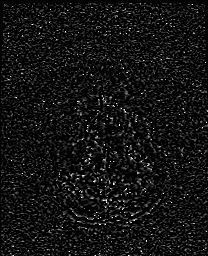

[Series 18: swi_images · axial · 3.0mm · 0.90mm/px · z∈[-92,+69]mm · 4 of 56 slices shown]
[im 1/56]
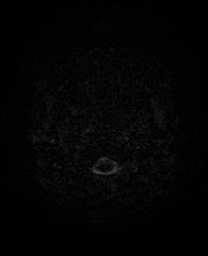
[im 19/56]
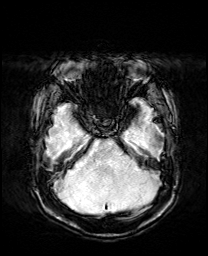
[im 37/56]
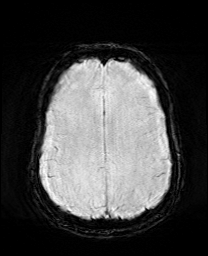
[im 56/56]
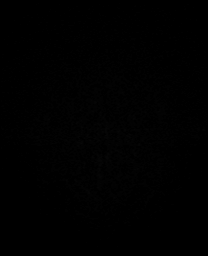

[Series 19: mip_images(sw) · axial · 24.0mm · 0.90mm/px · z∈[-82,+59]mm · 4 of 49 slices shown]
[im 1/49]
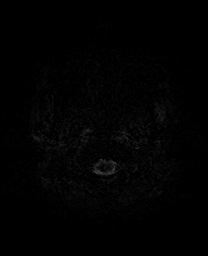
[im 17/49]
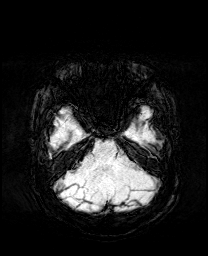
[im 33/49]
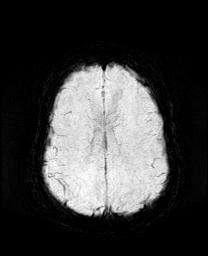
[im 49/49]
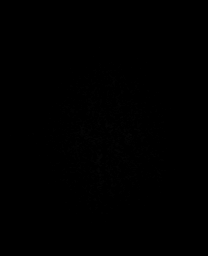

[Series 21: T2 · coronal · 5.0mm · 0.34mm/px · 2 of 32 slices shown (2 of 2)]
[im 1/32]
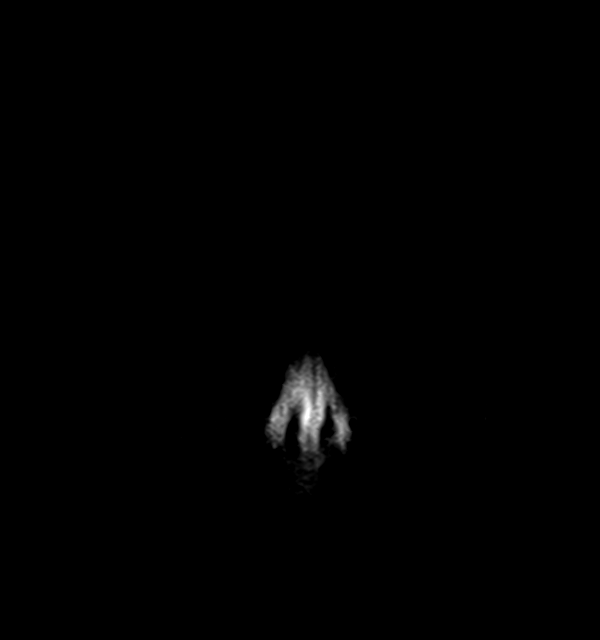
[im 32/32]
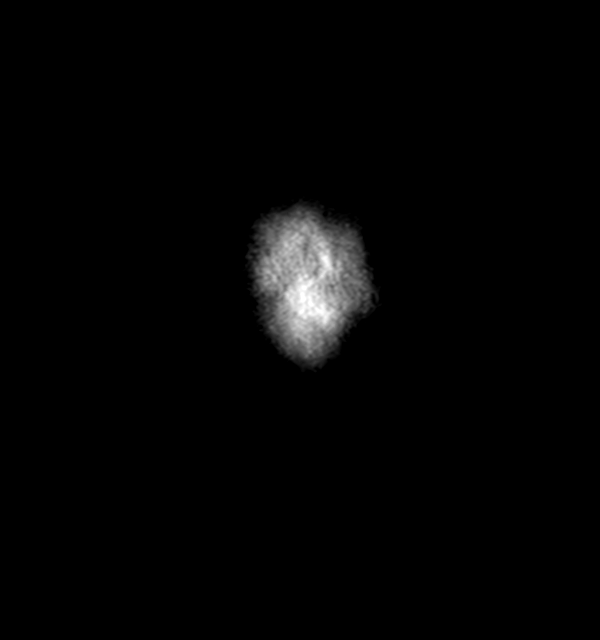

[Series 22: FLAIR · axial · 5.0mm · 0.90mm/px · 1 of 13 slices shown (2 of 2)]
[im 1/13]
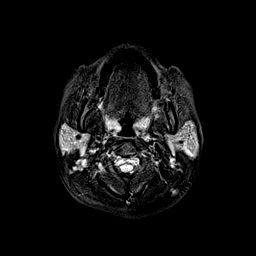

[44 of 48 positions shown; findings below may reference images not displayed]

FINDINGS: MRI HEAD FINDINGS

Brain: Examination mildly degraded by motion artifact.

Cerebral volume within normal limits. No focal parenchymal signal
abnormality. No abnormal foci of restricted diffusion to suggest
acute or subacute ischemia. Gray-white matter differentiation
maintained. No encephalomalacia to suggest chronic cortical
infarction. No foci of susceptibility artifact to suggest acute or
chronic intracranial hemorrhage.

No mass lesion, midline shift or mass effect. No hydrocephalus or
extra-axial fluid collection. Pituitary gland and suprasellar region
within normal limits. Midline structures intact and normal.

Vascular: Major intracranial vascular flow voids are maintained.

Skull and upper cervical spine: Mild cerebellar tonsillar ectopia of
approximately 3-4 mm without frank Chiari malformation. Visualized
upper cervical spine within normal limits. Bone marrow signal
intensity normal. No scalp soft tissue abnormality.

Sinuses/Orbits: Globes and orbital soft tissues demonstrate no acute
finding. Scattered mucosal thickening noted throughout the ethmoidal
air cells and maxillary sinuses. No air-fluid levels to suggest
acute sinusitis. No mastoid effusion. Inner ear structures grossly
normal.

Other: None.

MRA HEAD FINDINGS

Anterior circulation: Visualized distal cervical segments of the
internal carotid arteries are widely patent with antegrade flow.
Petrous, cavernous, and supraclinoid segments patent without
stenosis or other abnormality. A1 segments patent bilaterally.
Normal anterior communicating artery complex. Anterior cerebral
arteries patent without stenosis. No M1 stenosis or occlusion.
Normal MCA bifurcations. Distal MCA branches perfused and symmetric.

Posterior circulation: Vertebral arteries are largely codominant and
widely patent to the vertebrobasilar junction. Left PICA patent.
Right PICA not seen. Basilar patent to its distal aspect without
stenosis. Dominant right AICA. Superior cerebellar arteries patent
bilaterally. Left PCA supplied primarily via the basilar. Right PCA
supplied via a hypoplastic right P1 segment and robust right
posterior communicating artery. Both PCAs well perfused to their
distal aspects.

No intracranial aneurysm or other vascular malformation.

Anatomic variants: Predominant fetal type origin of the right PCA.

MRV HEAD FINDINGS

Normal flow related signal seen throughout the superior sagittal
sinus to the torcula. Transverse and sigmoid sinuses are patent as
are the visualized proximal internal jugular veins. Straight sinus,
vein of DEEQA RAYAAN, internal cerebral veins, and basal veins of DEEQA RAYAAN
are patent. No evidence for dural venous sinus thrombosis. No
appreciable cortical venous thrombosis.
IMPRESSION: 1. Normal MRI of the brain. No acute intracranial abnormality
identified.
2. Normal intracranial MRA.
3. Normal intracranial MRV. No evidence for dural venous sinus
thrombosis.

## 2021-09-29 IMAGING — MR MR [PERSON_NAME] HEAD
3 series · 41 of 48 positions shown · non-contrast
Comparison: None available.

CLINICAL DATA: Initial evaluation for acute headache, currently
pregnant.

EXAM:
MRI HEAD WITHOUT CONTRAST
MRA HEAD WITHOUT CONTRAST
MRV HEAD WITHOUT CONTRAST
TECHNIQUE: Multiplanar, multi-echo pulse sequences of the brain and surrounding
structures were acquired without intravenous contrast. Angiographic
images of the Circle of Willis were acquired using MRA technique
without intravenous contrast. Dedicated MRV images of the head using
the standard protocol without IV contrast was performed.

[Series 9: tof_fl2d_paracor · coronal · 2.0mm · 0.74mm/px · 12 of 142 slices shown]
[im 1/142]
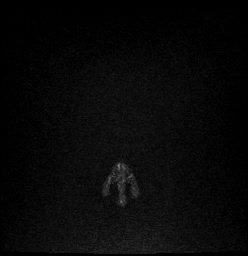
[im 13/142]
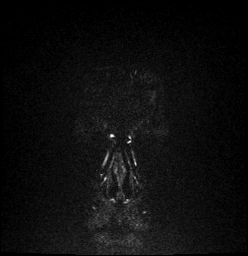
[im 26/142]
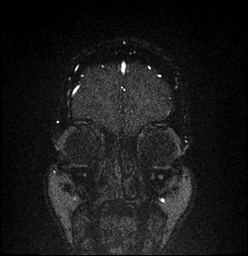
[im 39/142]
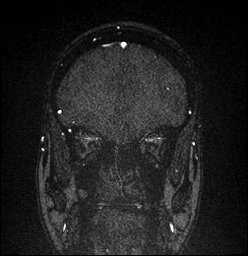
[im 52/142]
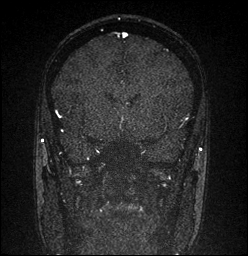
[im 65/142]
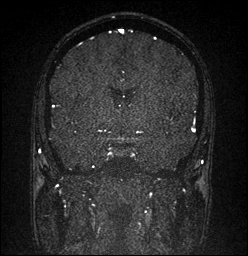
[im 77/142]
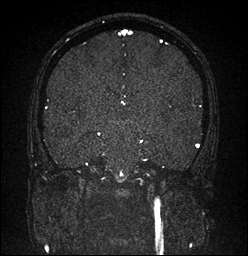
[im 90/142]
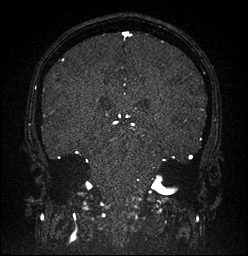
[im 103/142]
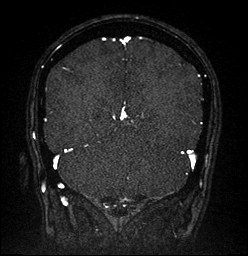
[im 116/142]
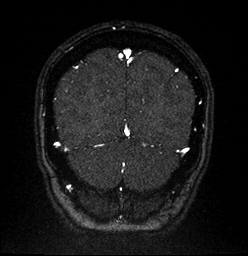
[im 129/142]
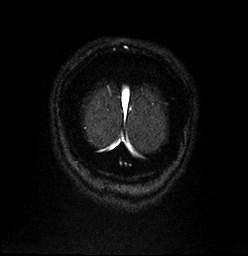
[im 142/142]
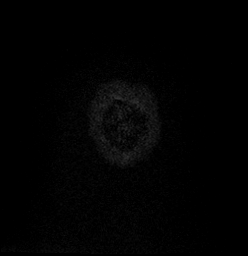

[Series 13: venous inhance coronal · coronal · portal-venous · 0.9mm · 0.57mm/px · 18 of 224 slices shown]
[im 1/224]
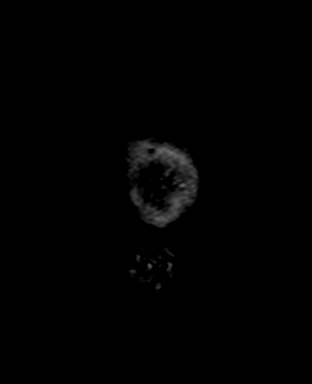
[im 14/224]
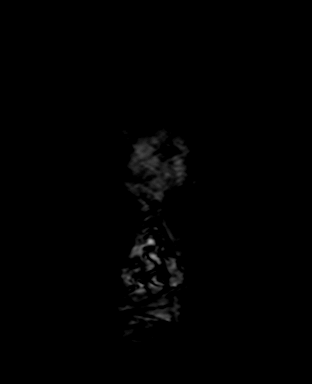
[im 27/224]
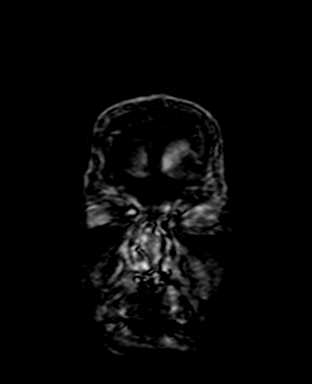
[im 40/224]
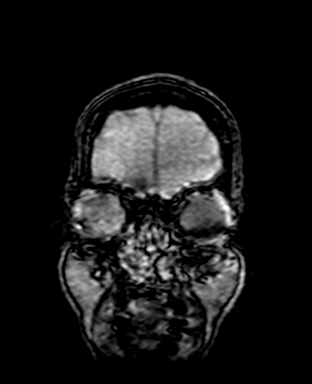
[im 53/224]
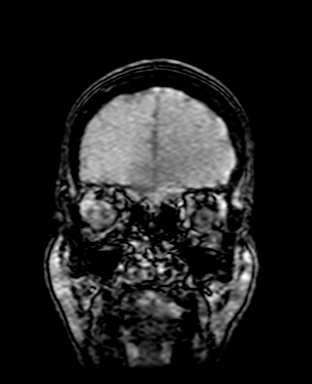
[im 66/224]
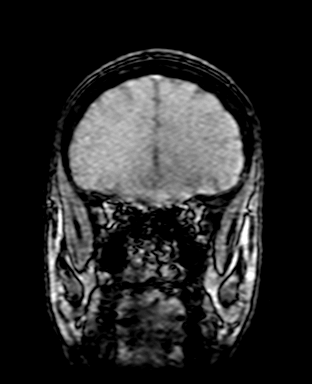
[im 79/224]
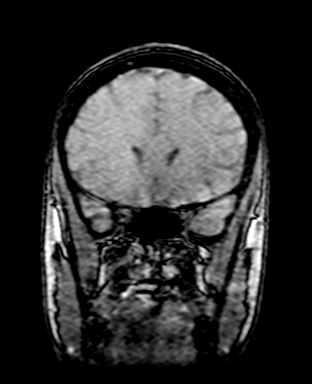
[im 92/224]
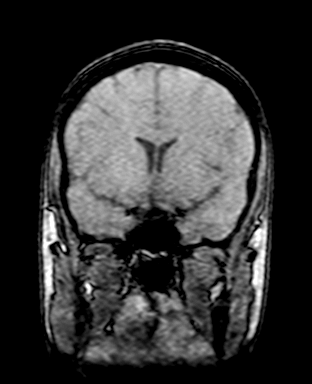
[im 105/224]
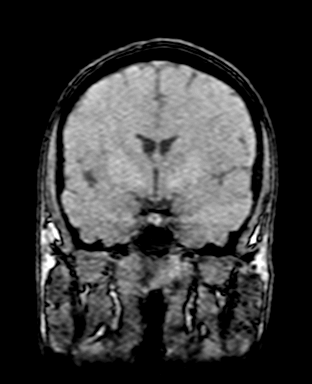
[im 119/224]
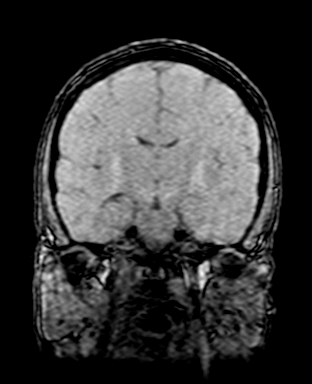
[im 132/224]
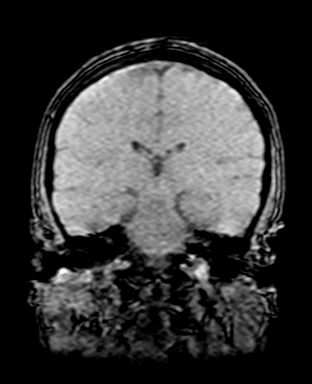
[im 145/224]
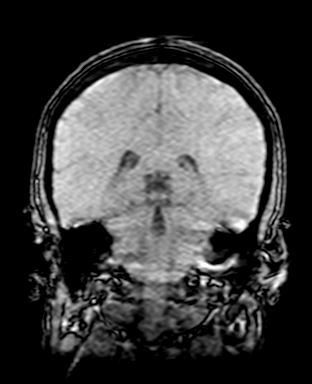
[im 158/224]
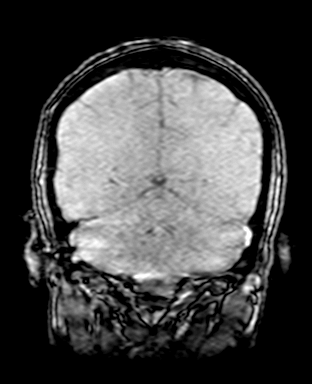
[im 171/224]
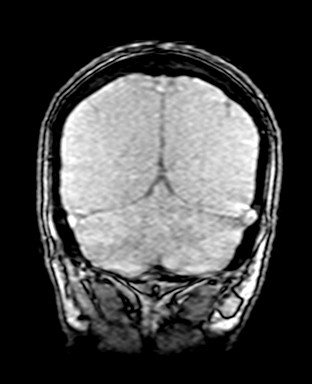
[im 184/224]
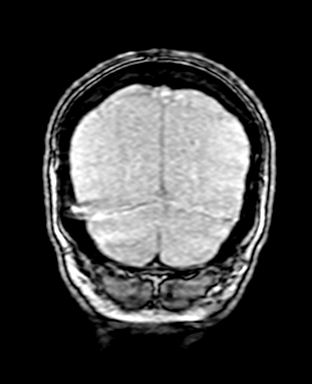
[im 197/224]
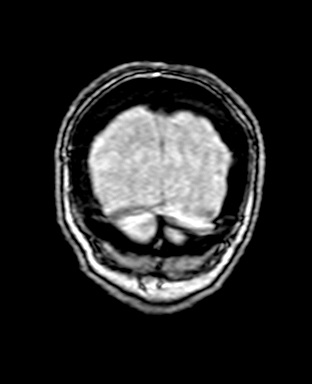
[im 210/224]
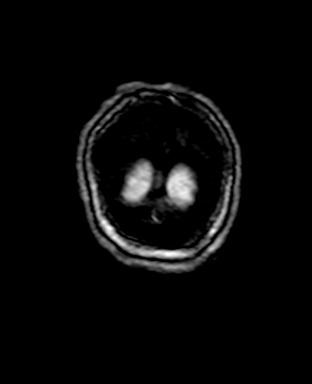
[im 224/224]
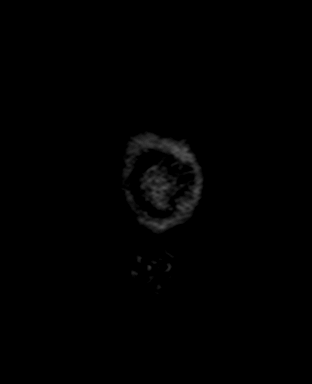

[Series 14: venous inhance coronal_msum · coronal · portal-venous · 0.9mm · 0.57mm/px · 11 of 216 slices shown]
[im 1/216]
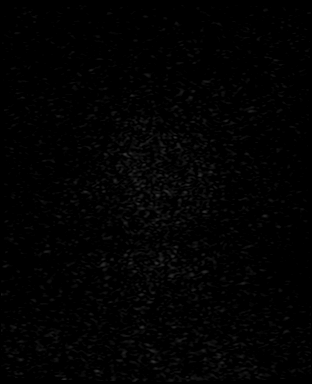
[im 13/216]
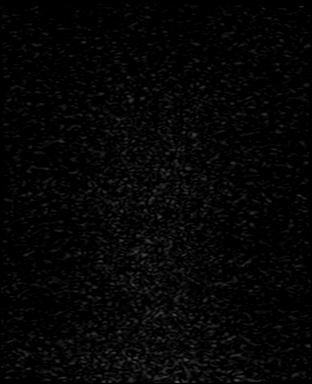
[im 38/216]
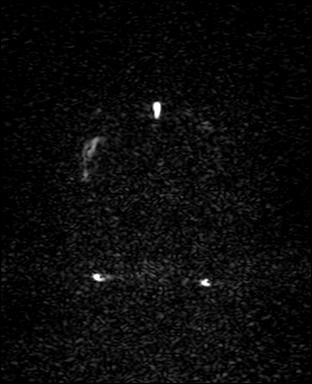
[im 64/216]
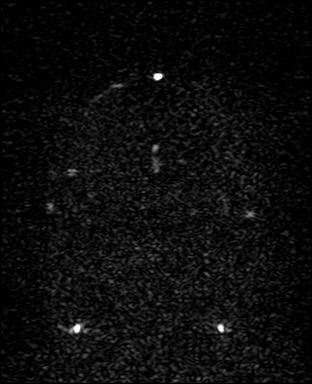
[im 89/216]
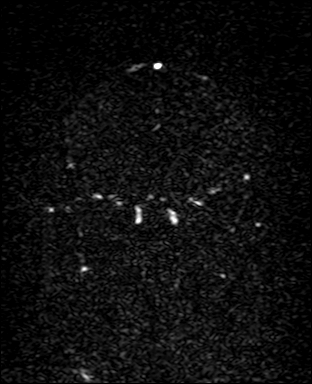
[im 114/216]
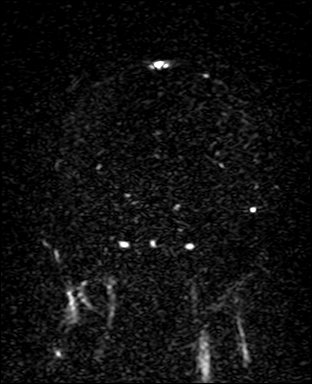
[im 127/216]
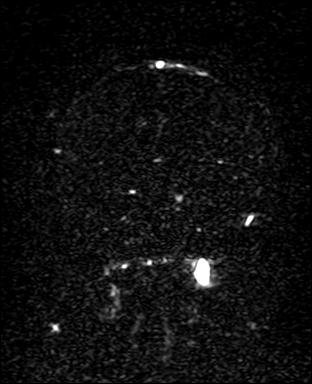
[im 152/216]
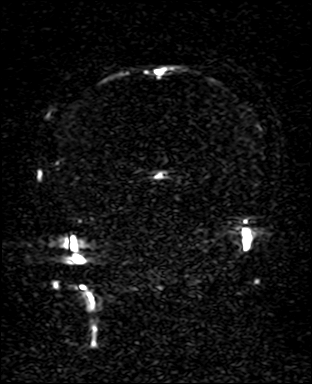
[im 178/216]
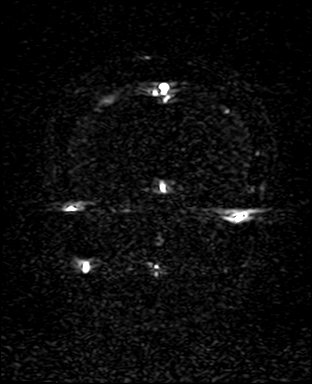
[im 190/216]
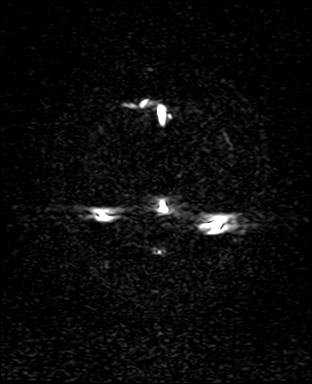
[im 203/216]
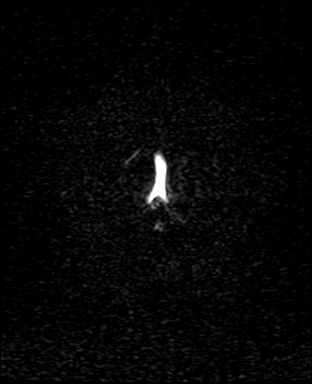

[41 of 48 positions shown; findings below may reference images not displayed]

FINDINGS: MRI HEAD FINDINGS

Brain: Examination mildly degraded by motion artifact.

Cerebral volume within normal limits. No focal parenchymal signal
abnormality. No abnormal foci of restricted diffusion to suggest
acute or subacute ischemia. Gray-white matter differentiation
maintained. No encephalomalacia to suggest chronic cortical
infarction. No foci of susceptibility artifact to suggest acute or
chronic intracranial hemorrhage.

No mass lesion, midline shift or mass effect. No hydrocephalus or
extra-axial fluid collection. Pituitary gland and suprasellar region
within normal limits. Midline structures intact and normal.

Vascular: Major intracranial vascular flow voids are maintained.

Skull and upper cervical spine: Mild cerebellar tonsillar ectopia of
approximately 3-4 mm without frank Chiari malformation. Visualized
upper cervical spine within normal limits. Bone marrow signal
intensity normal. No scalp soft tissue abnormality.

Sinuses/Orbits: Globes and orbital soft tissues demonstrate no acute
finding. Scattered mucosal thickening noted throughout the ethmoidal
air cells and maxillary sinuses. No air-fluid levels to suggest
acute sinusitis. No mastoid effusion. Inner ear structures grossly
normal.

Other: None.

MRA HEAD FINDINGS

Anterior circulation: Visualized distal cervical segments of the
internal carotid arteries are widely patent with antegrade flow.
Petrous, cavernous, and supraclinoid segments patent without
stenosis or other abnormality. A1 segments patent bilaterally.
Normal anterior communicating artery complex. Anterior cerebral
arteries patent without stenosis. No M1 stenosis or occlusion.
Normal MCA bifurcations. Distal MCA branches perfused and symmetric.

Posterior circulation: Vertebral arteries are largely codominant and
widely patent to the vertebrobasilar junction. Left PICA patent.
Right PICA not seen. Basilar patent to its distal aspect without
stenosis. Dominant right AICA. Superior cerebellar arteries patent
bilaterally. Left PCA supplied primarily via the basilar. Right PCA
supplied via a hypoplastic right P1 segment and robust right
posterior communicating artery. Both PCAs well perfused to their
distal aspects.

No intracranial aneurysm or other vascular malformation.

Anatomic variants: Predominant fetal type origin of the right PCA.

MRV HEAD FINDINGS

Normal flow related signal seen throughout the superior sagittal
sinus to the torcula. Transverse and sigmoid sinuses are patent as
are the visualized proximal internal jugular veins. Straight sinus,
vein of DEEQA RAYAAN, internal cerebral veins, and basal veins of DEEQA RAYAAN
are patent. No evidence for dural venous sinus thrombosis. No
appreciable cortical venous thrombosis.
IMPRESSION: 1. Normal MRI of the brain. No acute intracranial abnormality
identified.
2. Normal intracranial MRA.
3. Normal intracranial MRV. No evidence for dural venous sinus
thrombosis.

## 2021-09-29 MED ORDER — BUTALBITAL-APAP-CAFFEINE 50-325-40 MG PO TABS: 2.0000 | ORAL_TABLET | ORAL | 0 refills | Status: DC | PRN

## 2021-09-29 NOTE — MAU Note (Signed)
Hansel Feinstein, CNM at bedside. Discussed discharge instructions with pt and spouse. Pt and spouse both verbalized understanding.

## 2021-09-29 NOTE — MAU Note (Signed)
Transport team here to take pt to MRI.

## 2021-10-05 ENCOUNTER — Other Ambulatory Visit: Payer: Self-pay

## 2021-10-05 ENCOUNTER — Ambulatory Visit (INDEPENDENT_AMBULATORY_CARE_PROVIDER_SITE_OTHER): Payer: Medicaid Other

## 2021-10-05 VITALS — BP 120/82 | HR 90 | Wt 162.6 lb

## 2021-10-05 DIAGNOSIS — R519 Headache, unspecified: Secondary | ICD-10-CM

## 2021-10-05 DIAGNOSIS — Z0000 Encounter for general adult medical examination without abnormal findings: Secondary | ICD-10-CM

## 2021-10-05 NOTE — Progress Notes (Signed)
Pt visit today for BP check.  With [employer] # [id number] pt reports that she has headaches with no visual disturbances.  BP LA 120/82.  Per chart review pt was in MAU on 09/17/21 and was prescribed Fioricet for headaches.  I verified with pt if she is taking the medication pt states "no, it does not work."  Pt also informed me that the iron tablet that she is taking hurts her stomach.  I asked pt when her last bowel movement was pt stated two days ago.  Reviewed pt's concern with Dr. Kennon Rounds who reports pt can stop taking the iron tablet.  Pt advised that she can stop taking the iron tablet.  Pt questions/concerns addressed.  Pt requests food from the food market.  Pt provided with grab n go bag and informed that they will call her to schedule a shopping visit.  Pt verbalized understanding with no further questions.   Mel Almond, RN 10/06/21

## 2021-10-25 ENCOUNTER — Other Ambulatory Visit: Payer: Self-pay

## 2021-10-25 ENCOUNTER — Ambulatory Visit (INDEPENDENT_AMBULATORY_CARE_PROVIDER_SITE_OTHER): Payer: Medicaid Other | Admitting: Obstetrics and Gynecology

## 2021-10-25 ENCOUNTER — Encounter: Payer: Self-pay | Admitting: Obstetrics and Gynecology

## 2021-10-25 VITALS — BP 110/71 | HR 88 | Wt 161.3 lb

## 2021-10-25 DIAGNOSIS — N921 Excessive and frequent menstruation with irregular cycle: Secondary | ICD-10-CM

## 2021-10-25 DIAGNOSIS — Z975 Presence of (intrauterine) contraceptive device: Secondary | ICD-10-CM | POA: Diagnosis not present

## 2021-10-25 MED ORDER — NORETHINDRONE 0.35 MG PO TABS: 1.0000 | ORAL_TABLET | Freq: Every day | ORAL | 11 refills | Status: DC

## 2021-10-25 MED ORDER — IBUPROFEN 600 MG PO TABS
600.0000 mg | ORAL_TABLET | Freq: Four times a day (QID) | ORAL | 1 refills | Status: AC
Start: 2021-10-25 — End: 2021-11-01

## 2021-10-25 NOTE — Progress Notes (Signed)
Ibuporfen    Post Partum Visit Note  Jessica Robbins is a 21 y.o. [phone] female who presents for a postpartum visit. She is 6 weeks postpartum following a normal spontaneous vaginal delivery.  I have fully reviewed the prenatal and intrapartum course. The delivery was at 39.1 gestational weeks.  Anesthesia: epidural. Postpartum course has been uncomplicated. Baby is doing well. Baby is feeding by breast. Bleeding staining only. Bowel function is normal. Bladder function is normal. Patient is sexually active. Contraception method is Nexplanon. Postpartum depression screening: negative.   The pregnancy intention screening data noted above was reviewed. Potential methods of contraception were discussed. The patient elected to proceed with No data recorded.    Health Maintenance Due  Topic Date Due   HPV VACCINES (1 - 2-dose series) Never done   TETANUS/TDAP  Never done    The following portions of the patient's history were reviewed and updated as appropriate: allergies, current medications, past family history, past medical history, past social history, past surgical history, and problem list.  Review of Systems Pertinent items are noted in HPI.  Objective:  There were no vitals taken for this visit.   General:  alert, cooperative, and no distress   Breasts:  not indicated  Lungs: clear to auscultation bilaterally  Heart:  regular rate and rhythm  Abdomen: soft, non-tender; bowel sounds normal; no masses,  no organomegaly   Wound N/a  GU exam:  not indicated       Assessment:    There are no diagnoses linked to this encounter.  6wk  postpartum exam.   Plan:   Essential components of care per ACOG recommendations:  1.  Mood and well being: Patient with negative depression screening today. Reviewed local resources for support.  - Patient tobacco use? No.   - hx of drug use? No.    2. Infant care and feeding:  -Patient currently breastmilk feeding? Yes. Reviewed importance  of draining breast regularly to support lactation.  -Social determinants of health (SDOH) reviewed in EPIC. No concerns  3. Sexuality, contraception and birth spacing - Patient does not want a pregnancy in the next year.  Desired family size is 3 children for now.  - She has Nexplanon - reviewed strategies for BTB. She would like it removed but unsure what to replace with. She will talk to her friend and then come back for removal. Will do POP and NSAID for BTB for now.  - Discussed birth spacing of 18 months  4. Sleep and fatigue -Encouraged family/partner/community support of 4 hrs of uninterrupted sleep to help with mood and fatigue  5. Physical Recovery  - Discussed patients delivery and complications. She describes her labor as good. - Patient had a Vaginal, no problems at delivery. Patient had  no  laceration. Perineal healing reviewed. Patient expressed understanding - Patient has urinary incontinence? No. - Patient is safe to resume physical and sexual activity  6.  Health Maintenance - HM due items addressed Yes - Last pap smear  Diagnosis  Date Value Ref Range Status  03/06/2021   Final   - Negative for intraepithelial lesion or malignancy (NILM)   Pap smear not done at today's visit.  -Breast Cancer screening indicated? No.   7. Chronic Disease/Pregnancy Condition follow up: None  - PCP follow up  Isabell Jarvis, RN Center for [employer], [facility]

## 2021-11-23 DIAGNOSIS — Z23 Encounter for immunization: Secondary | ICD-10-CM | POA: Diagnosis not present

## 2021-12-25 ENCOUNTER — Ambulatory Visit (INDEPENDENT_AMBULATORY_CARE_PROVIDER_SITE_OTHER): Payer: Medicaid Other | Admitting: Family Medicine

## 2021-12-25 ENCOUNTER — Encounter: Payer: Self-pay | Admitting: Family Medicine

## 2021-12-25 VITALS — BP 116/78 | HR 87 | Wt 154.4 lb

## 2021-12-25 DIAGNOSIS — Z3009 Encounter for other general counseling and advice on contraception: Secondary | ICD-10-CM

## 2021-12-25 DIAGNOSIS — Z3046 Encounter for surveillance of implantable subdermal contraceptive: Secondary | ICD-10-CM

## 2021-12-25 NOTE — Progress Notes (Signed)
? ?  Subjective:  ? ? Patient ID: Jessica Robbins is a 21 y.o. female presenting with Procedure ? on 12/25/2021 ? ?HPI: ?Here today for Nexplanon removal. Has had ongoing bleeding due to this.  ?Does not desire any further contraception except condoms. ? ?Review of Systems  ?Constitutional:  Negative for chills and fever.  ?Respiratory:  Negative for shortness of breath.   ?Cardiovascular:  Negative for chest pain.  ?Gastrointestinal:  Negative for abdominal pain, nausea and vomiting.  ?Genitourinary:  Positive for vaginal bleeding. Negative for dysuria.  ?Skin:  Negative for rash.  ?   ?Objective:  ?  ?BP 116/78   Pulse 87   Wt 154 lb 6.4 oz (70 kg)   BMI 28.24 kg/m?  ?Physical Exam ?Exam conducted with a chaperone present.  ?Constitutional:   ?   General: She is not in acute distress. ?   Appearance: She is well-developed.  ?HENT:  ?   Head: Normocephalic and atraumatic.  ?Eyes:  ?   General: No scleral icterus. ?Cardiovascular:  ?   Rate and Rhythm: Normal rate.  ?Pulmonary:  ?   Effort: Pulmonary effort is normal.  ?Abdominal:  ?   Palpations: Abdomen is soft.  ?Musculoskeletal:  ?   Cervical back: Neck supple.  ?Skin: ?   General: Skin is warm and dry.  ?Neurological:  ?   Mental Status: She is alert and oriented to person, place, and time.  ? ?Procedure: ?Patient given informed consent for removal of her Nexplanon, time out was performed.  Signed copy in the chart.  Appropriate time out taken. Nexplanon site identified.  Area prepped in usual sterile fashon. One cc of 1% lidocaine was used to anesthetize the area at the distal end of the implant. A small stab incision was made right beside the implant on the distal portion.  The Nexplanon rod was grasped using hemostats and removed without difficulty.  There was less than 3 cc blood loss. There were no complications.  A small amount of antibiotic ointment and steri-strips were applied over the small incision.  A pressure bandage was applied to reduce any  bruising.  The patient tolerated the procedure well and was given post procedure instructions. ? ? ?   ?Assessment & Plan:  ? ?Problem List Items Addressed This Visit   ?None ?Visit Diagnoses   ? ? Nexplanon removal    -  Primary  ? Encounter for counseling regarding contraception      ? condom use and education given  ? ?  ? ? ?Return if symptoms worsen or fail to improve. ? ?Reva Bores, MD ?12/25/2021 ?10:48 AM ? ? ?

## 2022-03-14 ENCOUNTER — Emergency Department (HOSPITAL_COMMUNITY)
Admission: EM | Admit: 2022-03-14 | Discharge: 2022-03-14 | Discharge status: Against Medical Advice | Payer: Medicaid Other | Attending: Emergency Medicine | Admitting: Emergency Medicine

## 2022-03-14 ENCOUNTER — Encounter (HOSPITAL_COMMUNITY): Payer: Self-pay | Admitting: Pharmacy Technician

## 2022-03-14 DIAGNOSIS — Z5321 Procedure and treatment not carried out due to patient leaving prior to being seen by health care provider: Secondary | ICD-10-CM | POA: Diagnosis not present

## 2022-03-14 DIAGNOSIS — M79605 Pain in left leg: Secondary | ICD-10-CM | POA: Insufficient documentation

## 2022-03-14 DIAGNOSIS — M79662 Pain in left lower leg: Secondary | ICD-10-CM | POA: Diagnosis not present

## 2022-03-14 NOTE — ED Notes (Signed)
Patient stated that she is going home

## 2022-03-14 NOTE — ED Triage Notes (Signed)
Pt with left calf pain onset 3 days ago. Reports red painful area to calf. Denies chest pain/shob.

## 2022-03-14 NOTE — ED Provider Triage Note (Signed)
Emergency Medicine Provider Triage Evaluation Note  Jessica Robbins , a 21 y.o. female  was evaluated in triage.  Pt complains of calf pain. Atraumatic pain to L calf x 3 days, no fever, no cp/sob  Review of Systems  Positive: As above Negative: As above  Physical Exam  BP 124/71 (BP Location: Right Arm)   Pulse 82   Temp 98.1 F (36.7 C) (Oral)   Resp 18   SpO2 99%  Gen:   Awake, no distress   Resp:  Normal effort  MSK:   Moves extremities without difficulty  Other:  L calf: localize blister with surrounding erythema edema and warmth  Medical Decision Making  Medically screening exam initiated at 5:03 PM.  Appropriate orders placed.  Jessica Robbins was informed that the remainder of the evaluation will be completed by another provider, this initial triage assessment does not replace that evaluation, and the importance of remaining in the ED until their evaluation is complete.  Cellulitis vs abscess to L calf.  Doubt DVT   Fayrene Helper, PA-C 03/14/22 1707

## 2022-09-03 ENCOUNTER — Other Ambulatory Visit: Payer: Self-pay

## 2022-09-03 ENCOUNTER — Ambulatory Visit (INDEPENDENT_AMBULATORY_CARE_PROVIDER_SITE_OTHER): Payer: Medicaid Other | Admitting: Obstetrics and Gynecology

## 2022-09-03 VITALS — BP 111/79 | HR 89 | Wt 164.7 lb

## 2022-09-03 DIAGNOSIS — Z3202 Encounter for pregnancy test, result negative: Secondary | ICD-10-CM

## 2022-09-03 DIAGNOSIS — N912 Amenorrhea, unspecified: Secondary | ICD-10-CM | POA: Diagnosis not present

## 2022-09-03 LAB — POCT PREGNANCY, URINE: Preg Test, Ur: NEGATIVE

## 2022-09-03 NOTE — Progress Notes (Signed)
    GYNECOLOGY VISIT  Patient name: Jessica Robbins MRN [id number]  Date of birth: 2000-10-20 Chief Complaint:   Gynecologic Exam   History:  Jessica Robbins is a 21 y.o. [phone] being seen today for irregular/absent bleeding.  Reports having had irregular bleeding after delivery. Didn't bleed for 6 months after birth, then bled for 1 month.  Nexplanon removed in April and then bleeding after removal - 2 months of bleeding then no period after that. Having symptoms of menses without seeing blood. Not on birth control/contraception. No pregnancy test for the last 3 months but can tell she's not. [facility] menses 3d. During time of no bleeding. W/o period of having bloating and back pain. Constipation - no meds. No nausea but dizzy. No prior hx of abnl cycles that she reports prior to this episode  Per chart review, amenorrhea reported in 2021 and eventual hypothyroid diagnosis. Denies hx of thyroid issues or being on thyroid medication.   Thinks she is done having children but husband wants another in 5 years.   Past Medical History:  Diagnosis Date   Hyperthyroidism     Past Surgical History:  Procedure Laterality Date   NO PAST SURGERIES      The following portions of the patient's history were reviewed and updated as appropriate: allergies, current medications, past family history, past medical history, past social history, past surgical history and problem list.   Health Maintenance:   Last pap 02/2021. Results were: NILM w/ HRHPV negative.  Last mammogram: n/a.    Review of Systems:  Pertinent items are noted in HPI. Comprehensive review of systems was otherwise negative.   Objective:  Physical Exam BP 111/79   Pulse 89   Wt 164 lb 11.2 oz (74.7 kg)   LMP  (LMP Unknown)   BMI 30.12 kg/m    Physical Exam Vitals and nursing note reviewed.  Constitutional:      Appearance: Normal appearance.  HENT:     Head: Normocephalic and atraumatic.  Cardiovascular:     Rate and Rhythm:  Normal rate and regular rhythm.  Pulmonary:     Effort: Pulmonary effort is normal.     Breath sounds: Normal breath sounds.  Skin:    General: Skin is warm and dry.  Neurological:     General: No focal deficit present.     Mental Status: She is alert.  Psychiatric:        Mood and Affect: Mood normal.        Behavior: Behavior normal.        Thought Content: Thought content normal.        Judgment: Judgment normal.      Labs and Imaging Lab Results  Component Value Date   PREGTESTUR NEGATIVE 09/03/2022       Assessment & Plan:   1. Amenorrhea Negative pregnancy test. AUB labs to assess amenorrhea causes as well as pelvic US to assess for structural changes and treat accordingly.  - HgB A1c - Prolactin - TSH Rfx on Abnormal to Free T4 - US PELVIC COMPLETE WITH TRANSVAGINAL; Future  Routine preventative health maintenance measures emphasized.  Lorriane Shire, MD Minimally Invasive Gynecologic Surgery Center for [facility], [facility]

## 2022-09-04 ENCOUNTER — Telehealth: Payer: Self-pay

## 2022-09-04 ENCOUNTER — Other Ambulatory Visit: Payer: Self-pay | Admitting: Obstetrics and Gynecology

## 2022-09-04 DIAGNOSIS — R7989 Other specified abnormal findings of blood chemistry: Secondary | ICD-10-CM

## 2022-09-04 LAB — PROLACTIN: Prolactin: 55.2 ng/mL — ABNORMAL HIGH (ref 4.8–33.4)

## 2022-09-04 LAB — HEMOGLOBIN A1C
Est. average glucose Bld gHb Est-mCnc: 128 mg/dL
Hgb A1c MFr Bld: 6.1 % — ABNORMAL HIGH (ref 4.8–5.6)

## 2022-09-04 LAB — TSH RFX ON ABNORMAL TO FREE T4: TSH: 3.45 u[IU]/mL (ref 0.450–4.500)

## 2022-09-04 NOTE — Telephone Encounter (Deleted)
Attempted to call patient using [employer] #[id number]. Interpreter dial number and said she heard message "This phone number is not in use." I confirmed she dialed the right number. Will continue to try and contact patient.  

## 2022-09-04 NOTE — Telephone Encounter (Signed)
Attempted to call patient using [employer] [phone]. Interpreter dial number and said she heard message "This phone number is not in use." I confirmed she dialed the right number. Will continue to try and contact patient.

## 2022-09-04 NOTE — Telephone Encounter (Signed)
-----   Message from Lorriane Shire, MD sent at 09/04/2022 11:31 AM EST ----- Please contact patient and her let know that her A1c shows she has pre-diabetes and her prolactin level is elevated. Recommend repeat prolactin in about 1-2 weeks in the morning.

## 2022-09-06 NOTE — Telephone Encounter (Signed)
Contacted pt's sponsor and scheduled pt for 09/19/22 @ 1000 for prolactin only appt.   Leonette Nutting  09/06/22

## 2022-09-12 ENCOUNTER — Other Ambulatory Visit: Payer: Self-pay

## 2022-09-19 ENCOUNTER — Other Ambulatory Visit: Payer: Medicaid Other

## 2022-09-21 ENCOUNTER — Other Ambulatory Visit: Payer: Medicaid Other

## 2022-09-21 DIAGNOSIS — R7989 Other specified abnormal findings of blood chemistry: Secondary | ICD-10-CM | POA: Diagnosis not present

## 2022-09-22 LAB — PROLACTIN: Prolactin: 23.7 ng/mL (ref 4.8–33.4)

## 2022-09-25 ENCOUNTER — Telehealth: Payer: Self-pay

## 2022-09-25 ENCOUNTER — Other Ambulatory Visit: Payer: Self-pay | Admitting: Obstetrics and Gynecology

## 2022-09-25 DIAGNOSIS — N912 Amenorrhea, unspecified: Secondary | ICD-10-CM

## 2022-09-25 NOTE — Telephone Encounter (Signed)
-----   Message from Primitivo Gauze sent at 09/25/2022  9:41 AM EST ----- Got her scheduled  ----- Message ----- From: Martina Sinner, RN Sent: 09/25/2022   9:40 AM EST To: [facility] Admin Pool  Hi! Can you get this patient scheduled with Ajewole after 01/22? And then let me know when it is so I can call her and let her know. Thanks! ----- Message ----- From: Darliss Cheney, MD Sent: 09/25/2022   7:27 AM EST To: [facility] Clinical Pool  Contact patient and let her know that repeat prolactin normal. Will get additional labs and follow up ultrasound. Schedule visit for after ultrasound appointment.

## 2022-09-25 NOTE — Telephone Encounter (Signed)
Called patient sponsor. Made lab appointment for 01/22 and Sponsor aware of follow up appointment with C. Ajewole MD on 02/01.

## 2022-09-25 NOTE — Telephone Encounter (Signed)
Called patient at phone number on file using [employer] [phone]. Interpreter stated that when he called number it stated the number was invalid. Verified phone number and tried again with same result. Patient does not have active MyChart and there is not another phone number on file.

## 2022-10-02 ENCOUNTER — Ambulatory Visit: Payer: Medicaid Other | Admitting: Family Medicine

## 2022-10-03 ENCOUNTER — Other Ambulatory Visit: Payer: Medicaid Other

## 2022-10-04 ENCOUNTER — Encounter: Payer: Self-pay | Admitting: Family Medicine

## 2022-10-04 ENCOUNTER — Ambulatory Visit (INDEPENDENT_AMBULATORY_CARE_PROVIDER_SITE_OTHER): Payer: Medicaid Other | Admitting: Family Medicine

## 2022-10-04 VITALS — BP 104/64 | HR 78 | Ht 63.5 in | Wt 165.2 lb

## 2022-10-04 DIAGNOSIS — R109 Unspecified abdominal pain: Secondary | ICD-10-CM | POA: Diagnosis not present

## 2022-10-04 DIAGNOSIS — N390 Urinary tract infection, site not specified: Secondary | ICD-10-CM | POA: Diagnosis present

## 2022-10-04 DIAGNOSIS — G5600 Carpal tunnel syndrome, unspecified upper limb: Secondary | ICD-10-CM | POA: Diagnosis not present

## 2022-10-04 DIAGNOSIS — L282 Other prurigo: Secondary | ICD-10-CM | POA: Diagnosis not present

## 2022-10-04 DIAGNOSIS — R319 Hematuria, unspecified: Secondary | ICD-10-CM | POA: Diagnosis not present

## 2022-10-04 DIAGNOSIS — Z862 Personal history of diseases of the blood and blood-forming organs and certain disorders involving the immune mechanism: Secondary | ICD-10-CM

## 2022-10-04 DIAGNOSIS — Z8759 Personal history of other complications of pregnancy, childbirth and the puerperium: Secondary | ICD-10-CM | POA: Diagnosis not present

## 2022-10-04 DIAGNOSIS — R21 Rash and other nonspecific skin eruption: Secondary | ICD-10-CM

## 2022-10-04 DIAGNOSIS — R29898 Other symptoms and signs involving the musculoskeletal system: Secondary | ICD-10-CM | POA: Diagnosis not present

## 2022-10-04 DIAGNOSIS — Z7689 Persons encountering health services in other specified circumstances: Secondary | ICD-10-CM

## 2022-10-04 DIAGNOSIS — L301 Dyshidrosis [pompholyx]: Secondary | ICD-10-CM | POA: Diagnosis not present

## 2022-10-04 DIAGNOSIS — O99013 Anemia complicating pregnancy, third trimester: Secondary | ICD-10-CM | POA: Diagnosis not present

## 2022-10-04 LAB — POCT URINALYSIS DIP (MANUAL ENTRY)
Bilirubin, UA: NEGATIVE
Glucose, UA: NEGATIVE mg/dL
Ketones, POC UA: NEGATIVE mg/dL
Nitrite, UA: NEGATIVE
Protein Ur, POC: NEGATIVE mg/dL
Spec Grav, UA: >=1.03 — AB (ref 1.010–1.025)
Urobilinogen, UA: 0.2 E.U./dL
pH, UA: 5.5 (ref 5.0–8.0)

## 2022-10-04 LAB — POCT UA - MICROSCOPIC ONLY
Epithelial cells, urine per micros: >20
RBC, Urine, Miroscopic: NONE SEEN (ref 0–2)

## 2022-10-04 LAB — POCT SKIN KOH: Skin KOH, POC: NEGATIVE

## 2022-10-04 MED ORDER — TRIAMCINOLONE ACETONIDE 0.1 % EX CREA: 1.0000 | TOPICAL_CREAM | Freq: Two times a day (BID) | CUTANEOUS | 1 refills | Status: DC

## 2022-10-04 MED ORDER — CEPHALEXIN 500 MG PO CAPS: 500.0000 mg | ORAL_CAPSULE | Freq: Two times a day (BID) | ORAL | 0 refills | Status: AC

## 2022-10-04 NOTE — Patient Instructions (Addendum)
It was wonderful to see you today. Thank you for allowing me to be a part of your care. Below is a short summary of what we discussed at your visit today:  Urine complaint Your urine looks like you have a urinary tract infection. Take keflex twice daily for 5 days.   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 Use the triamcinolone steroid cream or ointment on your hands twice daily until it improves. Avoid contact with water frequently. If you can wear gloves while washing dishes or bathing children, this will avoid break outs.   For your back, get selson blue shampoo or generic form and apply to scalp and body every time you shower, at least three times weekly. Let the shampoo set on your skin for at least 3 minutes before rinsing off.   Other complaints Please come back in 2-3 weeks to follow up on how your skin is doing and address other complaints.     Please bring all of your medications to every appointment!  If you have any questions or concerns, please do not hesitate to contact us via phone or MyChart message.   Ezequiel Essex, MD

## 2022-10-04 NOTE — Progress Notes (Signed)
SUBJECTIVE:   CHIEF COMPLAINT / HPI:   Jessica Robbins is a pleasant 22 year old female here today to establish care.  She speaks Pashto.  She presents with an in person interpreter and her sponsor.  She also has several complaints she would like addressed including kidney pain, itchy skin, headache, body pain, and dizziness.  We discussed addressing the most urgent complaints and doing back for follow-up to adjust the rest.  Establish care Is [facility] sees Dr. Lorenza [place].  Was referred here to establish primary care.  Together with the interpreter, we reviewed her medical history, allergies, and medications.  Allergies: NKDA NKFA Meds: only OTC Tylenol, PRN, needs about 5-6 pills per week, moderate improvement PMHx: gestational HTN, anemia, hx IUFD, hypothyroid (resolved, not on medication),  carpal tunnel  Anemia note: Last CBC one year ago, was not taking iron pill, sponsor reports she needed several blood transfusions  Kidney complaint Pain and discomfort "in left kidney" intermittently Some fever and chills, low abdominal pain No known chronic UTI Scheduled to undergo [place] soon through OBGYN for amenorrhea and lower abdominal pain  Skin itchiness Including scalp, back, chest Duration about 1 year, since last pregnancy No one else in household has same rash Does have marks on skin per patient, but these are not pustular or draining  Pruritus hand rash Pruritic rash over bilateral hands Made worse with water Hands issue for past two months Is wearing gloves when doing dishes or bathing children to avoid flare  PERTINENT  PMH / PSH:  Patient Active Problem List   Diagnosis Date Noted   Carpal tunnel syndrome 10/05/2022   Encounter to establish care 10/05/2022   Dyshidrotic eczema 10/05/2022   Urinary tract infection with hematuria 10/05/2022   Pruritic rash 10/05/2022   Weakness of both hands 09/29/2021    History of gestational hypertension 09/27/2021   Anemia affecting pregnancy in third trimester-folate/b12 and Fe deficiency 06/28/2021   Hypothyroidism 04/14/2021   History of IUFD 02/27/2021   Language barrier 02/27/2021    OBJECTIVE:   BP 104/64   Pulse 78   Ht 5' 3.5" (1.613 m)   Wt 165 lb 3.2 oz (74.9 kg)   SpO2 98%   Breastfeeding Yes   BMI 28.80 kg/m    PHQ-9:     12/25/2021   11:34 AM 09/01/2021   10:27 AM 08/25/2021   12:06 PM  Depression screen PHQ 2/9  Decreased Interest 0 0 0  Down, Depressed, Hopeless 0 0 0  PHQ - 2 Score 0 0 0  Altered sleeping 0 0 0  Tired, decreased energy 0 2 2  Change in appetite 0 0 0  Feeling bad or failure about yourself  0 0 0  Trouble concentrating 0 0 0  Moving slowly or fidgety/restless 0 0 0  Suicidal thoughts  0 0  PHQ-9 Score 0 2 2  Difficult doing work/chores  Not difficult at all     Physical Exam General: Awake, alert, oriented Cardiovascular: Regular rate and rhythm, S1 and S2 present, no murmurs auscultated Respiratory: Lung fields clear to auscultation bilaterally GI/GU: No CVA tenderness bilaterally Extremities: Right hand with pruritic hyperpigmented plaque in webspace between thumb and first finger, scattered red pinpoint lesions over fingers bilaterally Skin: Scattered circular and ovular small 1 to 2 mm lesions across back, chest, and under breasts with normal skin centrally and hyperpigmented edges, no discharge or erythema  ASSESSMENT/PLAN:   Encounter to  establish care Medical history reviewed, updated as necessary.  Will obtain CBC and CMP to check status of anemia and screen for electrolyte abnormalities.  Anemia affecting pregnancy in third trimester-folate/b12 and Fe deficiency Currently not taking her iron pill.  Will obtain CBC today.  Urinary tract infection with hematuria Given complaint of lower abdominal pain and left kidney pain, obtained UA which demonstrated leukocytes and hematuria.  No CVA  tenderness or signs of complicated UTI.  Rx Keflex twice daily x 5 days.  Dyshidrotic eczema Scabies skin scraping of hands unable to be completed as apparently I did not collect enough skin sample on the slide.  However, history and physical appears consistent with dyshidrotic eczema.  Will treat with moderate dose triamcinolone cream twice daily until improvement.  Follow-up in 2 to 3 weeks.  Pruritic rash 1 year duration, covering scalp, chest, and back.  KOH skin scraping negative.  Given distribution and physical appearance, will trial Selsun Blue shampoo over body 3 times weekly.  Instructed patient to leave on for 3 minutes before rinsing off.  Follow-up in 2 to 3 weeks.     Jessica Essex, MD [place]

## 2022-10-05 DIAGNOSIS — N390 Urinary tract infection, site not specified: Secondary | ICD-10-CM

## 2022-10-05 DIAGNOSIS — G5600 Carpal tunnel syndrome, unspecified upper limb: Secondary | ICD-10-CM | POA: Insufficient documentation

## 2022-10-05 DIAGNOSIS — L301 Dyshidrosis [pompholyx]: Secondary | ICD-10-CM | POA: Insufficient documentation

## 2022-10-05 DIAGNOSIS — L282 Other prurigo: Secondary | ICD-10-CM | POA: Insufficient documentation

## 2022-10-05 DIAGNOSIS — Z7689 Persons encountering health services in other specified circumstances: Secondary | ICD-10-CM | POA: Insufficient documentation

## 2022-10-05 HISTORY — DX: Urinary tract infection, site not specified: N39.0

## 2022-10-05 LAB — COMPREHENSIVE METABOLIC PANEL
ALT: 24 IU/L (ref 0–32)
AST: 24 IU/L (ref 0–40)
Albumin/Globulin Ratio: 1.3 (ref 1.2–2.2)
Albumin: 4.5 g/dL (ref 4.0–5.0)
Alkaline Phosphatase: 122 IU/L — ABNORMAL HIGH (ref 44–121)
BUN/Creatinine Ratio: 19 (ref 9–23)
BUN: 11 mg/dL (ref 6–20)
Bilirubin Total: 0.3 mg/dL (ref 0.0–1.2)
CO2: 23 mmol/L (ref 20–29)
Calcium: 9.4 mg/dL (ref 8.7–10.2)
Chloride: 101 mmol/L (ref 96–106)
Creatinine, Ser: 0.57 mg/dL (ref 0.57–1.00)
Globulin, Total: 3.4 g/dL (ref 1.5–4.5)
Glucose: 92 mg/dL (ref 70–99)
Potassium: 4.4 mmol/L (ref 3.5–5.2)
Sodium: 138 mmol/L (ref 134–144)
Total Protein: 7.9 g/dL (ref 6.0–8.5)
eGFR: 130 mL/min/{1.73_m2} (ref >59)

## 2022-10-05 LAB — CBC
Hematocrit: 39 % (ref 34.0–46.6)
Hemoglobin: 13.2 g/dL (ref 11.1–15.9)
MCH: 27.4 pg (ref 26.6–33.0)
MCHC: 33.8 g/dL (ref 31.5–35.7)
MCV: 81 fL (ref 79–97)
Platelets: 348 10*3/uL (ref 150–450)
RBC: 4.81 x10E6/uL (ref 3.77–5.28)
RDW: 12.6 % (ref 11.7–15.4)
WBC: 7.4 10*3/uL (ref 3.4–10.8)

## 2022-10-05 NOTE — Assessment & Plan Note (Signed)
Given complaint of lower abdominal pain and left kidney pain, obtained UA which demonstrated leukocytes and hematuria.  No CVA tenderness or signs of complicated UTI.  Rx Keflex twice daily x 5 days.

## 2022-10-05 NOTE — Assessment & Plan Note (Signed)
Scabies skin scraping of hands unable to be completed as apparently I did not collect enough skin sample on the slide.  However, history and physical appears consistent with dyshidrotic eczema.  Will treat with moderate dose triamcinolone cream twice daily until improvement.  Follow-up in 2 to 3 weeks.

## 2022-10-05 NOTE — Assessment & Plan Note (Signed)
1 year duration, covering scalp, chest, and back.  KOH skin scraping negative.  Given distribution and physical appearance, will trial Selsun Blue shampoo over body 3 times weekly.  Instructed patient to leave on for 3 minutes before rinsing off.  Follow-up in 2 to 3 weeks.

## 2022-10-05 NOTE — Assessment & Plan Note (Signed)
Medical history reviewed, updated as necessary.  Will obtain CBC and CMP to check status of anemia and screen for electrolyte abnormalities.

## 2022-10-05 NOTE — Assessment & Plan Note (Signed)
Currently not taking her iron pill.  Will obtain CBC today.

## 2022-10-08 ENCOUNTER — Ambulatory Visit
Admission: RE | Admit: 2022-10-08 | Discharge: 2022-10-08 | Disposition: A | Discharge status: Home / Self Care | Payer: Medicaid Other | Source: Ambulatory Visit | Attending: Obstetrics and Gynecology | Admitting: Obstetrics and Gynecology

## 2022-10-08 ENCOUNTER — Other Ambulatory Visit: Payer: Self-pay

## 2022-10-08 ENCOUNTER — Other Ambulatory Visit: Payer: Medicaid Other

## 2022-10-08 DIAGNOSIS — R188 Other ascites: Secondary | ICD-10-CM | POA: Diagnosis not present

## 2022-10-08 DIAGNOSIS — N912 Amenorrhea, unspecified: Secondary | ICD-10-CM

## 2022-10-08 DIAGNOSIS — N926 Irregular menstruation, unspecified: Secondary | ICD-10-CM | POA: Diagnosis not present

## 2022-10-12 LAB — FOLLICLE STIMULATING HORMONE: FSH: 5.2 m[IU]/mL

## 2022-10-12 LAB — ANTI MULLERIAN HORMONE: ANTI-MULLERIAN HORMONE (AMH): 17.1 ng/mL — ABNORMAL HIGH

## 2022-10-12 LAB — ESTRADIOL: Estradiol: 25.9 pg/mL

## 2022-10-18 ENCOUNTER — Encounter: Payer: Self-pay | Admitting: Obstetrics and Gynecology

## 2022-10-18 ENCOUNTER — Ambulatory Visit (INDEPENDENT_AMBULATORY_CARE_PROVIDER_SITE_OTHER): Payer: Medicaid Other | Admitting: Obstetrics and Gynecology

## 2022-10-18 ENCOUNTER — Other Ambulatory Visit: Payer: Self-pay

## 2022-10-18 VITALS — BP 105/65 | HR 72

## 2022-10-18 DIAGNOSIS — N912 Amenorrhea, unspecified: Secondary | ICD-10-CM

## 2022-10-18 MED ORDER — NORETHIN ACE-ETH ESTRAD-FE 1-20 MG-MCG(24) PO TABS: 1.0000 | ORAL_TABLET | Freq: Every day | ORAL | 6 refills | Status: DC

## 2022-10-18 NOTE — Progress Notes (Signed)
    GYNECOLOGY VISIT  Patient name: Jessica Robbins MRN [id number]  Date of birth: [birth year] Chief Complaint:   Follow-up  History:  Jessica Robbins is a 22 y.o. [phone] being seen today for follow up for amenorrhea and continues to be amenorrheic.  Having pain but without actual menses.   Still breastfeeding as well (13 month) - 3 children. Do no want to get pregnant any time soon. Feed her 5-6 times a day. 2 menses following delivery.   CHC contraindications: none Nexplanon use previously  Past Medical History:  Diagnosis Date   Hyperthyroidism     Past Surgical History:  Procedure Laterality Date   NO PAST SURGERIES      The following portions of the patient's history were reviewed and updated as appropriate: allergies, current medications, past family history, past medical history, past social history, past surgical history and problem list.      Review of Systems:  Pertinent items are noted in HPI. Comprehensive review of systems was otherwise negative.   Objective:  Physical Exam BP 105/65   Pulse 72    Physical Exam   Labs and Imaging US PELVIC COMPLETE WITH TRANSVAGINAL  Result Date: 10/08/2022 CLINICAL DATA:  Irregular bleeding, LMP 07/28/2022 EXAM: TRANSABDOMINAL AND TRANSVAGINAL ULTRASOUND OF PELVIS TECHNIQUE: Both transabdominal and transvaginal ultrasound examinations of the pelvis were performed. Transabdominal technique was performed for global imaging of the pelvis including uterus, ovaries, adnexal regions, and pelvic cul-de-sac. It was necessary to proceed with endovaginal exam following the transabdominal exam to visualize the LEFT ovary. COMPARISON:  None Available. FINDINGS: Uterus Measurements: 8.1 x 3.4 x 6.0 cm = volume: 83 mL. Anteverted. Normal morphology without mass Endometrium Thickness: 4 mm.  No endometrial fluid or mass Right ovary Measurements: 4.1 x 2.6 x 2.3 cm = volume: 13.0 mL. Numerous small follicles. No dominant mass. Left ovary  Measurements: 3.8 x 2.4 x 3.6 cm = volume: 17.2 mL. Numerous small follicles. No dominant mass. Other findings Trace free pelvic fluid.  No adnexal masses. IMPRESSION: Unremarkable uterus and endometrial complex. Numerous small follicles within both ovaries, can be seen in patients with polycystic ovarian syndrome. Electronically Signed   By: Lavonia Dana M.D.   On: 10/08/2022 12:25       Assessment & Plan:   1. Amenorrhea Possible contribution of lactational amenorrhea given current continued frequent breast-feeding.  Lab work normal with unremarkable ultrasound.  Patient expressed interest in contraception, also wanting to have her menses return.  Recommend starting OCPs, and see if she has resumption of menses during the placebo week.  Reviewed that it is possible that she may not have typical menstrual cycle while continuing her breast-feeding.  Also recommend observation of bleeding patterns once breast-feeding is weaned..  Follow-up response in 3 months - Norethindrone Acetate-Ethinyl Estrad-FE (LOESTRIN 24 FE) 1-20 MG-MCG(24) tablet; Take 1 tablet by mouth daily.  Dispense: 60 tablet; Refill: 6   Routine preventative health maintenance measures emphasized.  Darliss Cheney, MD [facility]

## 2022-10-23 ENCOUNTER — Encounter: Payer: Self-pay | Admitting: Family Medicine

## 2022-10-23 ENCOUNTER — Ambulatory Visit (INDEPENDENT_AMBULATORY_CARE_PROVIDER_SITE_OTHER): Payer: Medicaid Other | Admitting: Family Medicine

## 2022-10-23 VITALS — BP 100/62 | HR 72 | Wt 164.8 lb

## 2022-10-23 DIAGNOSIS — G8929 Other chronic pain: Secondary | ICD-10-CM | POA: Diagnosis not present

## 2022-10-23 DIAGNOSIS — R4589 Other symptoms and signs involving emotional state: Secondary | ICD-10-CM | POA: Diagnosis not present

## 2022-10-23 DIAGNOSIS — Z23 Encounter for immunization: Secondary | ICD-10-CM | POA: Diagnosis present

## 2022-10-23 DIAGNOSIS — R519 Headache, unspecified: Secondary | ICD-10-CM | POA: Diagnosis not present

## 2022-10-23 DIAGNOSIS — M545 Low back pain, unspecified: Secondary | ICD-10-CM

## 2022-10-23 NOTE — Patient Instructions (Addendum)
I have translated the following text using Google translate.  As such, there are many errors.  I apologize for the poor written translation; however, we do not have written  translation services yet. It was wonderful to see you today. Thank you for allowing me to be a part of your care. Below is a short summary of what we discussed at your visit today: ?? ????? ??? ? ???? ????? ?? ?????? ??? ?????? ??. ?? ?? ????? ???? ???? ???? ???. ?? ? ???? ???? ??? ????? ????? ????? ?????? ?? ??????? ??? ?? ???? ? ????? ????? ??????? ?? ???. ?? ?? ??? ???? ?? ?? ?? ??? ???? ?????. ???? ?? ????? ????? ?? ????? ? ??????? ???? ??. ????? ? ??? ?? ????? ?? ?? ??? ?? ????? ?? ????? ?? ??? ???:   Back pain Likely from muscle spasms of the back. You are lifting quite a lot. Please refer to the hand out on back stretches. Please perform all these stretches once daily in order to stretch your back muscles and improve your pain.   Also, please apply one lidocaine patch to the skin of your lower back once daily for 12 hours. You can use lidocaine 4% patches from over the counter or a patch called "Salon Pas".  You could also take Aleve once a day for no more than 1-2 weeks at a time.  ? ??? ??? ?????? ? ?? ? ?????? ? ????????? ???. ???? ???? ??? ????? ???. ??????? ???? ? ?? ?? ????? ?? ??? ?? ?????? ????. ??????? ???? ?? ??? ???????? ?? ??? ?? ?? ?? ????? ??? ???? ????? ? ?? ????? ???? ??? ?? ????? ??? ?? ???. ?????? ? ??????? ???? ? 12 ??????? ????? ?? ??? ?? ?? ?? ????? ? ???? ?? ????? ?? ? ???????? ??? ??? ???. ???? ???? ?? ? ?????? ??? ? ???????? 4? ??? ?????? ?? ? "????? ???" ?? ??? ??? ??????. ???? ???? ?? ?? ??? ?? ?? ?? ? 1-2 ????? ????? ?? ?? ??? ?? Aleve ?????.   Headache Please drink more water. Goal of 8 or more bottles in a day. Try tylenol or aleve. Do not exceed the maximum amounts listed on the packaging.  ? ?? ??? ??????? ???? ???? ???? ????. ?? ??? ?? ? 8 ?? ???? ??????? ???. ??????? ?? ???? ??? ????. ?? ????  ???? ?? ???? ??? ????? ????? ??? ??? ?? ???.   Sadness This is understandable given your circumstances. Please see the handout for resources on therapy.  ????? ?? ????? ?????? ?? ?? ??? ??? ? ?????? ?? ??. ??????? ???? ? ?????? ?? ??? ? ?????? ????? ?????? ?????.   Please bring all of your medications to every appointment!  If you have any questions or concerns, please do not hesitate to contact us via phone or MyChart message.  ??????? ???? ?? ?????? ?? ??? ??? ???? ?????! ?? ???? ???? ?????? ?? ??????? ???? ??????? ???? ? ?????? ?? MyChart ????? ?? ???? ??? ??? ????? ?????.   Ezequiel Essex, MD

## 2022-10-23 NOTE — Progress Notes (Signed)
SUBJECTIVE:   CHIEF COMPLAINT / HPI:   In person Pashto interpreter Research scientist (physical sciences) utilized for entirety of appointment. Jessica Robbins presents with her female sponsor for care today of fatigue, headaches, back pain, and sadness.   Fatigue  Jessica Robbins reports 2-3 months duration of persistent fatigue. She finds this hinders her ability to care for her children and home. She has 3 small children at home and is the sole caregiver as her husband works outside of the home.   Does have history of amenorrhea since childbirth about a year ago. Has been following with OBGYN about this, they believe it is likely secondary to breast feeding. They have prescribed an OCP to provide regular periods at the patient's request. She will start this soon per patient and sponsor.   Prior labs 10/04/2022:  CBC 13.2, WBC 7.4 CMP w/ alk phos 122, all other wnl  TSH 12/18 normal  Back pain Jessica Robbins reports ongoing "kidney pain". Was treated for UTI at last appointment [facility] 1/18 with keflex BID x 5 days. She reports she has completed this treatment but continues to have "pain in both kidneys". Does not believe the keflex made any difference in the pain. Sponsor voices that she though Jessica Robbins told her the keflex made her feel better.   Patient reports pain in flank and low back bilaterally. Present all the time, but worse with standing, lifting, and changes in position. No weakness of legs or new focal neurological deficits. Has not tried anything for this pain.   Headache Jessica Robbins also reports headaches "all the time" that last all day every day. These are sometimes associated with dizziness, but no syncope. Endorses some weakness in her hands, but she has known bilateral carpal tunnel, so this accounts for perceived weakness. No known blood pressure issues. No acute vision changes. Did have headaches in pregnancy treated with Fioricet.   Sadness Jessica Robbins at the end of the visit also notes "sadness all the time  for no reason". Sponsor relays that her parents are still in [place] and her father is very ill. Patient is able to speak with her mother on the phone frequently. No thoughts of wanting to hurt herself or her children. Jessica Robbins reports she thinks this sadness is causing her to be short with her husband and therefore causing tension in her marriage.   PERTINENT  PMH / PSH:  Patient Active Problem List   Diagnosis Date Noted   Need for immunization against influenza 10/25/2022   Chronic bilateral low back pain without sciatica 10/25/2022   Chronic nonintractable headache 10/25/2022   Sad mood 10/25/2022   Carpal tunnel syndrome 10/05/2022   Dyshidrotic eczema 10/05/2022   Pruritic rash 10/05/2022   Weakness of both hands 09/29/2021   History of gestational hypertension 09/27/2021   Hypothyroidism 04/14/2021   History of IUFD 02/27/2021   Language barrier 02/27/2021    OBJECTIVE:   BP 100/62   Pulse 72   Wt 164 lb 12.8 oz (74.8 kg)   SpO2 98%   Breastfeeding Yes   BMI 28.74 kg/m    Gen: awake, alert, NAD Resp: speaking clearly in full sentences, CTAB Cardiac: RRR, no murmur, skin warm to touch MSK: no midline TTP over any area of spine, although moderate TTP of lumbar paraspinals and over SI joints bilaterally Neuro: strength 5/5 and equal bilaterally of hips, knees, and ankles, BLE sensation intact and equal  ASSESSMENT/PLAN:   Need for immunization against influenza Tolerated flu  vaccine well without adverse side effects.   Chronic bilateral low back pain without sciatica Likely MSK in nature. Physical exam re-assuring. Discussed MSK solutions. For acute relief suggested lidocaine 4% patches Q24 and/or NSAID such as Aleve BID for several days to reduce inflammation. For chronic solution, discussed importance of stretching, core strengthening, and proper lifting. Provided handout with diagrams on low back stretches, recommended she do these daily. Return precautions given,  see AVS for more. If no improvement, could consider muscle relaxer, would select baclofen for least sedative effect given child care requirements.   Chronic nonintractable headache Unclear etiology, sounds chronic. No red flags, physical exam reassuring. Discussed improving water intake to 6-8 bottles of water in a day. Answered sponsor's question about bottled vs filtered water. Suggested that MSK back spasms and pain could be causing headaches, in which case NSAIDs and stretching of low back may provide some headache relief. HA could also be secondary to sad mood, see that problem A&P for more. Return precautions given.   Sad mood Chronic, likely exogenous etiology. She has several external factors that would cause sad mood in any person: refugee status, ill parents far away, sole care giver to 3 young children, language barrier causing isolation, limited local personal support system. Discussed that at this time, I think this sad mood is appropriate and normal for her circumstances. We talked about how medication is an option but may be of limited help given exogenous factors. I firmly believe that counseling would be much more effective to help at this time. Provided information on culturally appropriate [place] refugee counseling program, handout provided to patient and sponsor. Discussed that medication is certainly an option if she would like in the future. All questions answered.    Ezequiel Essex, MD [place]

## 2022-10-25 ENCOUNTER — Encounter: Payer: Self-pay | Admitting: Family Medicine

## 2022-10-25 DIAGNOSIS — G8929 Other chronic pain: Secondary | ICD-10-CM | POA: Insufficient documentation

## 2022-10-25 DIAGNOSIS — Z23 Encounter for immunization: Secondary | ICD-10-CM | POA: Insufficient documentation

## 2022-10-25 DIAGNOSIS — R4589 Other symptoms and signs involving emotional state: Secondary | ICD-10-CM | POA: Insufficient documentation

## 2022-10-25 DIAGNOSIS — R519 Headache, unspecified: Secondary | ICD-10-CM | POA: Insufficient documentation

## 2022-10-25 DIAGNOSIS — M545 Low back pain, unspecified: Secondary | ICD-10-CM | POA: Insufficient documentation

## 2022-10-25 NOTE — Assessment & Plan Note (Signed)
Unclear etiology, sounds chronic. No red flags, physical exam reassuring. Discussed improving water intake to 6-8 bottles of water in a day. Answered sponsor's question about bottled vs filtered water. Suggested that MSK back spasms and pain could be causing headaches, in which case NSAIDs and stretching of low back may provide some headache relief. HA could also be secondary to sad mood, see that problem A&P for more. Return precautions given.

## 2022-10-25 NOTE — Assessment & Plan Note (Signed)
Tolerated flu vaccine well without adverse side effects. 

## 2022-10-25 NOTE — Assessment & Plan Note (Signed)
Likely MSK in nature. Physical exam re-assuring. Discussed MSK solutions. For acute relief suggested lidocaine 4% patches Q24 and/or NSAID such as Aleve BID for several days to reduce inflammation. For chronic solution, discussed importance of stretching, core strengthening, and proper lifting. Provided handout with diagrams on low back stretches, recommended she do these daily. Return precautions given, see AVS for more. If no improvement, could consider muscle relaxer, would select baclofen for least sedative effect given child care requirements.

## 2022-10-25 NOTE — Assessment & Plan Note (Signed)
Chronic, likely exogenous etiology. She has several external factors that would cause sad mood in any person: refugee status, ill parents far away, sole care giver to 3 young children, language barrier causing isolation, limited local personal support system. Discussed that at this time, I think this sad mood is appropriate and normal for her circumstances. We talked about how medication is an option but may be of limited help given exogenous factors. I firmly believe that counseling would be much more effective to help at this time. Provided information on culturally appropriate [place] refugee counseling program, handout provided to patient and sponsor. Discussed that medication is certainly an option if she would like in the future. All questions answered.

## 2023-02-18 ENCOUNTER — Ambulatory Visit: Payer: Medicaid Other | Admitting: Family Medicine

## 2023-02-21 ENCOUNTER — Ambulatory Visit (INDEPENDENT_AMBULATORY_CARE_PROVIDER_SITE_OTHER): Payer: Medicaid Other | Admitting: Family Medicine

## 2023-02-21 ENCOUNTER — Encounter: Payer: Self-pay | Admitting: Family Medicine

## 2023-02-21 VITALS — BP 118/72 | HR 76 | Ht 63.5 in | Wt 163.2 lb

## 2023-02-21 DIAGNOSIS — M5412 Radiculopathy, cervical region: Secondary | ICD-10-CM

## 2023-02-21 DIAGNOSIS — N926 Irregular menstruation, unspecified: Secondary | ICD-10-CM

## 2023-02-21 DIAGNOSIS — S4991XA Unspecified injury of right shoulder and upper arm, initial encounter: Secondary | ICD-10-CM | POA: Diagnosis not present

## 2023-02-21 LAB — POCT URINE PREGNANCY: Preg Test, Ur: NEGATIVE

## 2023-02-21 NOTE — Progress Notes (Signed)
SUBJECTIVE:   CHIEF COMPLAINT / HPI:   Shoulder and neck pain Jessica Robbins is a pleasant 22 yo woman who is here with her sponsor and an in-person Pashto interpreter for concern of neck and shoulder pain.   Previously seen in our clinic 2/06 by myself.  At that time, we discussed lower back and flank pains.  I recommended lidocaine patch and NSAID for short course.  Also discussed stretching, strengthening, proper lifting.  Provided low back stretches.  Today, she reports pain more so in her neck and right shoulder.  Lidocaine patches are not really helping.  She reports a previous injury to her right shoulder when living in [place] at the age of 33.  She believes she broke her shoulder, but did not receive care for this.  Now with bone pain in the shoulder, right arm throbbing, and intermittent right hand weakness.  Did drop something 1 time.  Also having some neck pain associated with hand numbness and tingling (although mainly on the right side).  She reports hands "feel like they have ants in them" and also oscillate between being cold and hot.  Does have previous diagnoses of carpal tunnel, for which a wrist splint helped greatly.   PERTINENT  PMH / PSH:  Patient Active Problem List   Diagnosis Date Noted   Irregular menses 02/23/2023   Injury of right shoulder 02/23/2023   Cervical radiculopathy 02/23/2023   Chronic bilateral low back pain without sciatica 10/25/2022   Chronic nonintractable headache 10/25/2022   Sad mood 10/25/2022   Carpal tunnel syndrome 10/05/2022   Dyshidrotic eczema 10/05/2022   Pruritic rash 10/05/2022   Weakness of both hands 09/29/2021   History of gestational hypertension 09/27/2021   Hypothyroidism 04/14/2021   History of IUFD 02/27/2021   Language barrier 02/27/2021    OBJECTIVE:   BP 118/72   Pulse 76   Ht 5' 3.5" (1.613 m)   Wt 163 lb 3.2 oz (74 kg)   LMP 12/17/2022 (Approximate)   SpO2 98%   BMI 28.46 kg/m     PHQ-9:      02/21/2023    3:19 PM 10/23/2022    9:19 AM 10/18/2022    9:59 AM  Depression screen PHQ 2/9  Decreased Interest 0 0 0  Down, Depressed, Hopeless 0 0 0  PHQ - 2 Score 0 0 0  Altered sleeping 1 0 0  Tired, decreased energy 0 0 0  Change in appetite 0 0 0  Feeling bad or failure about yourself  0 0 0  Trouble concentrating 0 0 0  Moving slowly or fidgety/restless 0 0 0  Suicidal thoughts 0 0 0  PHQ-9 Score 1 0 0  Difficult doing work/chores  Not difficult at all     GAD-7:     10/18/2022    9:59 AM 12/25/2021   11:34 AM 09/01/2021   10:28 AM 08/25/2021   12:06 PM  GAD 7 : Generalized Anxiety Score  Nervous, Anxious, on Edge 0 0 0 0  Control/stop worrying 0 0 0 0  Worry too much - different things 0 0 0 0  Trouble relaxing 0 0 0 0  Restless 0 0 0 0  Easily annoyed or irritable 0 0 0 0  Afraid - awful might happen 0 0 0 0  Total GAD 7 Score 0 0 0 0  Anxiety Difficulty   Not difficult at all    Physical Exam General: Awake, alert, oriented Cardiovascular: Regular rate and rhythm,  S1 and S2 present, no murmurs auscultated Respiratory: Lung fields clear to auscultation bilaterally MSK: Shoulders unremarkable visually, without gross swellling or deformity, moderate TTP over right distal clavicle, right AC joint, and right superior humeral head, mild TTP over bilateral trapezius muscles, no midline tenderness through cervical and thoracic spine, right upper extremity strength 4/5 of shoulder, push, pull, and grip; left upper extremity strength 5/5 in all areas. Special tests:  Spurling test positive bilaterally Right shoulder pain with resisted right hand pronation and supination Right shoulder pain with resisted internal and external rotation Negative Neer sign Positive painful cross body abduction of right arm Empty can test positive on right  ASSESSMENT/PLAN:   Cervical radiculopathy Spurling test positive bilaterally, at this time unclear if chronic muscle spasm versus vertebral  issues.  Will obtain C-spine x-ray.    Injury of right shoulder With right shoulder pain and radicular symptoms plus history of right shoulder injury, will obtain shoulder XR series.   Irregular menses Is on OCP from OBGYN. She is quite pleased with her return of menses during the placebo pills of the OCP. However, does have sub-optimal adherence, no menses last month, and unprotected intercourse. Urine pregnancy today negative, believe she is safe to go for XR.     Fayette Pho, MD [facility]

## 2023-02-21 NOTE — Patient Instructions (Addendum)
I have translated the following text using Google translate.  As such, there are many errors.  I apologize for the poor written translation; however, we do not have written  translation services yet. It was wonderful to see you today. Thank you for allowing me to be a part of your care. Below is a short summary of what we discussed at your visit today: ?? ????? ??? ? ???? ????? ?? ?????? ??? ?????? ??. ?? ?? ????? ???? ???? ???? ???. ?? ? ???? ???? ??? ????? ????? ????? ?????? ?? ??????? ??? ?? ???? ? ????? ????? ??????? ?? ???. ?? ?? ??? ???? ?? ?? ?? ??? ???? ?????. ???? ?? ?? ?? ????? ????? ?? ????? ? ??????? ???? ??. ????? ? ??? ?? ????? ?? ?? ??? ?? ????? ?? ????? ?? ??? ???:  Shoulder and neck pain Go for x-rays of the neck and right shoulder. Walk in any time during normal business hours.  [facility] [address], [place], [place] [address] [phone] Mon-Fri 8-5 ? ???? ?? ???? ??? ? ???? ?? ?? ???? ? ???? ?? ????? ?????. ? ???? ??????? ??????? ?? ????? ?? ?? ??? ?????. ????????? ?????? [address] [place], [place], [place] [address] [phone] ?????? - ???? 8-5      Lack of periods I am very glad your periods have returned with the daily birth control pills.  You have enough refills of this medication to last you a whole year.  Let your sponsor and pharmacy know when you have only one week left of the medicine so you can get some more.  Pharmacy information is below.  [id number] [place], [place] - [address] AT [facility] [address], [place] [address] Phone: [phone]  Fax: [phone]  ? ???? ??????? ?? ??? ??? ?? ?? ????? ???? ? ????? ????? ?????? ????? ??? ????? ??????? ???. ???? ? ?? ????? ???? ???? ??? ???? ???? ??? ??? ??? ???. ????? ????? ??? ?????? ?? ??????? ??? ??? ??? ?? ???? ? ????? ????? ??? ???? ???? ???? ?? ???? ???? ?? ?? ?? ??? ?????? ???. ? ?????? ??????? ????? ??. ? ??????? ? ????? ??????? # [id number]  - ?????????, [place] - [address] AT [facility] [address] ????? [address],  [place] [address] ??????: [phone]  ????: [phone]  Please bring all of your medications to every appointment! If you have any questions or concerns, please do not hesitate to contact us via phone or MyChart message.  ??????? ???? ?? ?????? ?? ??? ??? ???? ?????! ?? ???? ???? ?????? ?? ??????? ???? ??????? ???? ? ?????? ?? MyChart ????? ?? ???? ??? ??? ????? ?????.  Fayette Pho, MD

## 2023-02-23 DIAGNOSIS — S4991XA Unspecified injury of right shoulder and upper arm, initial encounter: Secondary | ICD-10-CM | POA: Insufficient documentation

## 2023-02-23 DIAGNOSIS — M5412 Radiculopathy, cervical region: Secondary | ICD-10-CM | POA: Insufficient documentation

## 2023-02-23 DIAGNOSIS — N926 Irregular menstruation, unspecified: Secondary | ICD-10-CM | POA: Insufficient documentation

## 2023-02-23 MED ORDER — BACLOFEN 10 MG PO TABS: 10.0000 mg | ORAL_TABLET | Freq: Three times a day (TID) | ORAL | 0 refills | Status: DC

## 2023-02-23 NOTE — Assessment & Plan Note (Signed)
Is on OCP from OBGYN. She is quite pleased with her return of menses during the placebo pills of the OCP. However, does have sub-optimal adherence, no menses last month, and unprotected intercourse. Urine pregnancy today negative, believe she is safe to go for XR.

## 2023-02-23 NOTE — Assessment & Plan Note (Signed)
With right shoulder pain and radicular symptoms plus history of right shoulder injury, will obtain shoulder XR series.

## 2023-02-23 NOTE — Assessment & Plan Note (Signed)
Spurling test positive bilaterally, at this time unclear if chronic muscle spasm versus vertebral issues.  Will obtain C-spine x-ray.

## 2023-02-26 ENCOUNTER — Ambulatory Visit
Admission: RE | Admit: 2023-02-26 | Discharge: 2023-02-26 | Disposition: A | Discharge status: Home / Self Care | Payer: Medicaid Other | Source: Ambulatory Visit | Attending: Family Medicine | Admitting: Family Medicine

## 2023-02-26 DIAGNOSIS — S4991XA Unspecified injury of right shoulder and upper arm, initial encounter: Secondary | ICD-10-CM

## 2023-02-26 DIAGNOSIS — M25511 Pain in right shoulder: Secondary | ICD-10-CM | POA: Diagnosis not present

## 2023-02-26 DIAGNOSIS — M5412 Radiculopathy, cervical region: Secondary | ICD-10-CM | POA: Diagnosis not present

## 2023-03-01 ENCOUNTER — Encounter: Payer: Self-pay | Admitting: Family Medicine

## 2023-04-24 ENCOUNTER — Encounter (HOSPITAL_COMMUNITY): Payer: Self-pay

## 2023-04-24 ENCOUNTER — Ambulatory Visit (HOSPITAL_COMMUNITY)
Admission: RE | Admit: 2023-04-24 | Discharge: 2023-04-24 | Disposition: A | Discharge status: Home / Self Care | Payer: Medicaid Other | Source: Ambulatory Visit | Attending: Internal Medicine | Admitting: Internal Medicine

## 2023-04-24 VITALS — BP 105/70 | HR 75 | Temp 98.1°F | Resp 18

## 2023-04-24 DIAGNOSIS — R109 Unspecified abdominal pain: Secondary | ICD-10-CM | POA: Diagnosis not present

## 2023-04-24 DIAGNOSIS — Z3202 Encounter for pregnancy test, result negative: Secondary | ICD-10-CM | POA: Diagnosis not present

## 2023-04-24 DIAGNOSIS — N898 Other specified noninflammatory disorders of vagina: Secondary | ICD-10-CM

## 2023-04-24 LAB — POCT URINALYSIS DIP (MANUAL ENTRY)
Bilirubin, UA: NEGATIVE
Glucose, UA: NEGATIVE mg/dL
Ketones, POC UA: NEGATIVE mg/dL
Leukocytes, UA: NEGATIVE
Nitrite, UA: NEGATIVE
Protein Ur, POC: NEGATIVE mg/dL
Spec Grav, UA: 1.025 (ref 1.010–1.025)
Urobilinogen, UA: 0.2 E.U./dL
pH, UA: 5.5 (ref 5.0–8.0)

## 2023-04-24 LAB — POCT URINE PREGNANCY: Preg Test, Ur: NEGATIVE

## 2023-04-24 MED ORDER — ONDANSETRON 4 MG PO TBDP: 4.0000 mg | ORAL_TABLET | Freq: Three times a day (TID) | ORAL | 0 refills | Status: DC | PRN

## 2023-04-24 NOTE — ED Triage Notes (Signed)
Per interpreter, pt c/o rt flank pain with burning on urination x3 wks. Denies taking any meds.

## 2023-04-24 NOTE — Discharge Instructions (Addendum)
Urinalysis today shows a little bit of blood, since you have pain in your back, I think you have a kidney stone.  Please drink a lot of water and start straining your urine.  You can take Tylenol [phone] mg every 6 hours for pain alternating with ibuprofen 800 mg every 8 hours for pain.  If you develop nausea, take Zofran every 8 hours as needed.   We will contact you if the vaginal swab shows anything that requires treatment.    If you develop fever, nausea/vomiting and are unable to keep fluids down, please go to the ER.

## 2023-04-24 NOTE — ED Provider Notes (Signed)
[facility]    CSN: [id number] Arrival date & time: 04/24/23  1601      History   Chief Complaint Chief Complaint  Patient presents with   Flank Pain    HPI Jessica Robbins is a 22 y.o. female.   Patient presents today with family friend, [name].  Interpreter was used for today's visit.  Patient complains of right flank pain, burning with urination for the past 3 weeks.  She endorses urinary frequency and urgency when she has the "kidney pain."  Also endorses intermittent nausea.  No new urinary incontinence, voiding smaller amounts, foul urinary odor, or blood in the urine.  No abdominal pain, back pain, suprapubic pain or pressure, fever, or vomiting.  No change in appetite.  Patient reports vaginal discharge that has been ongoing since her last delivery approximately 1.5 years ago.  No vaginal odor or new sexual partner.  No concern for STIs today.  Patient is currently breast-feeding, has irregular menstrual cycles.    Past Medical History:  Diagnosis Date   Anemia affecting pregnancy in third trimester-folate/b12 and Fe deficiency 06/28/2021   CBC  Latest Ref Rng & Units  06/27/2021  03/06/2021  02/27/2021  WBC  3.4 - 10.8 x10E3/uL  8.6  8.5  8.9  Hemoglobin  11.1 - 15.9 g/dL  0.9(N)  23.5  57.3  Hematocrit  34.0 - 46.6 %  29.4(L)  34.6  35.1  Platelets  150 - 450 x10E3/uL  302  329  324     Iron therapy recommended- Orders in signed held for venoferx2  Folate deficiency-- sent in Folic acid 1g  B12 deficiency- sent injection to pharmacy-   Hyperthyroidism    Urinary tract infection with hematuria 10/05/2022    Patient Active Problem List   Diagnosis Date Noted   Irregular menses 02/23/2023   Injury of right shoulder 02/23/2023   Cervical radiculopathy 02/23/2023   Chronic bilateral low back pain without sciatica 10/25/2022   Chronic nonintractable headache 10/25/2022   Sad mood 10/25/2022   Carpal tunnel syndrome 10/05/2022   Dyshidrotic eczema 10/05/2022    Pruritic rash 10/05/2022   Weakness of both hands 09/29/2021   History of gestational hypertension 09/27/2021   Hypothyroidism 04/14/2021   History of IUFD 02/27/2021   Language barrier 02/27/2021    Past Surgical History:  Procedure Laterality Date   NO PAST SURGERIES      OB History     Gravida  4   Para  4   Term  3   Preterm  1   AB  0   Living  3      SAB  0   IAB  0   Ectopic  0   Multiple  0   Live Births  3            Home Medications    Prior to Admission medications   Medication Sig Start Date End Date Taking? Authorizing Provider  ondansetron (ZOFRAN-ODT) 4 MG disintegrating tablet Take 1 tablet (4 mg total) by mouth every 8 (eight) hours as needed for nausea or vomiting. 04/24/23  Yes Valentino Nose, NP  baclofen (LIORESAL) 10 MG tablet Take 1 tablet (10 mg total) by mouth 3 (three) times daily. 02/23/23   Valetta Close, MD  butalbital-acetaminophen-caffeine (FIORICET) [phone] MG tablet Take 2 tablets by mouth every 4 (four) hours as needed for headache. Patient not taking: Reported on 12/25/2021 09/29/21   Aviva Signs, CNM  DODEX 1000 MCG/ML  injection  08/15/21   [provider]  ferrous sulfate (FERROUSUL) 325 (65 FE) MG tablet Take 1 tablet (325 mg total) by mouth every other day. Patient not taking: Reported on 12/25/2021 06/28/21   Tereso Newcomer, MD  Norethindrone Acetate-Ethinyl Estrad-FE (LOESTRIN 24 FE) 1-20 MG-MCG(24) tablet Take 1 tablet by mouth daily. 10/18/22   Lorriane Shire, MD  Prenatal Vit-Fe Fumarate-FA (MULTIVITAMIN-PRENATAL) 27-0.8 MG TABS tablet TAKE 1 TABLET BY MOUTH DAILY AT 12 NOON. Patient not taking: Reported on 12/25/2021 05/11/21   Allayne Stack, DO  triamcinolone cream (KENALOG) 0.1 % Apply 1 Application topically 2 (two) times daily. 10/04/22   Valetta Close, MD    Family History Family History  Problem Relation Age of Onset   Cancer Neg Hx    Diabetes Neg Hx    Hypertension Neg  Hx     Social History Social History   Tobacco Use   Smoking status: Never   Smokeless tobacco: Never  Vaping Use   Vaping status: Never Used  Substance Use Topics   Alcohol use: Never   Drug use: Never     Allergies   Patient has no known allergies.   Review of Systems Review of Systems Per HPI  Physical Exam Triage Vital Signs ED Triage Vitals  Encounter Vitals Group     BP 04/24/23 1016 105/70     Systolic BP Percentile --      Diastolic BP Percentile --      Pulse Rate 04/24/23 1016 75     Resp 04/24/23 1016 18     Temp 04/24/23 1016 98.1 F (36.7 C)     Temp Source 04/24/23 1016 Oral     SpO2 04/24/23 1016 97 %     Weight --      Height --      Head Circumference --      Peak Flow --      Pain Score 04/24/23 1019 10     Pain Loc --      Pain Education --      Exclude from Growth Chart --    No data found.  Updated Vital Signs BP 105/70 (BP Location: Left Arm)   Pulse 75   Temp 98.1 F (36.7 C) (Oral)   Resp 18   LMP 02/27/2023   SpO2 97%   Breastfeeding No   Visual Acuity Right Eye Distance:   Left Eye Distance:   Bilateral Distance:    Right Eye Near:   Left Eye Near:    Bilateral Near:     Physical Exam Vitals and nursing note reviewed.  Constitutional:      General: She is not in acute distress.    Appearance: She is not toxic-appearing.  HENT:     Right Ear: External ear normal.     Left Ear: External ear normal.     Nose: Nose normal. No congestion or rhinorrhea.     Mouth/Throat:     Mouth: Mucous membranes are moist.     Pharynx: Oropharynx is clear.  Cardiovascular:     Rate and Rhythm: Normal rate and regular rhythm.  Pulmonary:     Effort: Pulmonary effort is normal. No respiratory distress.     Breath sounds: No wheezing, rhonchi or rales.  Abdominal:     General: Abdomen is flat. Bowel sounds are normal. There is no distension.     Palpations: Abdomen is soft. There is no mass.     Tenderness: There is  no  abdominal tenderness. There is right CVA tenderness and left CVA tenderness. There is no guarding.  Genitourinary:    Comments: Deferred - self swab performed by patient Musculoskeletal:     Cervical back: Normal range of motion.  Lymphadenopathy:     Cervical: No cervical adenopathy.  Skin:    General: Skin is warm and dry.     Coloration: Skin is not jaundiced or pale.     Findings: No erythema.  Neurological:     Mental Status: She is alert and oriented to person, place, and time.     Motor: No weakness.     Gait: Gait normal.  Psychiatric:        Behavior: Behavior is cooperative.      UC Treatments / Results  Labs (all labs ordered are listed, but only abnormal results are displayed) Labs Reviewed  POCT URINALYSIS DIP (MANUAL ENTRY) - Abnormal; Notable for the following components:      Result Value   Clarity, UA hazy (*)    Blood, UA small (*)    All other components within normal limits  POCT URINE PREGNANCY  CERVICOVAGINAL ANCILLARY ONLY    EKG   Radiology No results found.  Procedures Procedures (including critical care time)  Medications Ordered in UC Medications - No data to display  Initial Impression / Assessment and Plan / UC Course  I have reviewed the triage vital signs and the nursing notes.  Pertinent labs & imaging results that were available during my care of the patient were reviewed by me and considered in my medical decision making (see chart for details).   Patient is well-appearing, normotensive, afebrile, not tachycardic, not tachypneic, oxygenating well on room air.    1. Flank pain Urinalysis is hazy with small mount of blood, I am suspicious for kidney stone Symptoms are not consistent with classic UTI and no white blood cells or nitrates in the urine, therefore urine culture deferred Patient has CVA tenderness as well Will treat with over-the-counter analgesics, Zofran as needed for nausea, pushing hydration with plenty of  water Strict ER precautions discussed with patient and family friend  2. Vaginal discharge Vaginal cytology is pending-treat as indicated  3. Urine pregnancy test negative  The patient was given the opportunity to ask questions.  All questions answered to their satisfaction.  The patient is in agreement to this plan.    Final Clinical Impressions(s) / UC Diagnoses   Final diagnoses:  Flank pain  Vaginal discharge  Urine pregnancy test negative     Discharge Instructions      Urinalysis today shows a little bit of blood, since you have pain in your back, I think you have a kidney stone.  Please drink a lot of water and start straining your urine.  You can take Tylenol [phone] mg every 6 hours for pain alternating with ibuprofen 800 mg every 8 hours for pain.  If you develop nausea, take Zofran every 8 hours as needed.   We will contact you if the vaginal swab shows anything that requires treatment.    If you develop fever, nausea/vomiting and are unable to keep fluids down, please go to the ER.       ED Prescriptions     Medication Sig Dispense Auth. Provider   ondansetron (ZOFRAN-ODT) 4 MG disintegrating tablet Take 1 tablet (4 mg total) by mouth every 8 (eight) hours as needed for nausea or vomiting. 20 tablet Valentino Nose, NP  PDMP not reviewed this encounter.   Valentino Nose, NP 04/24/23 1328

## 2023-04-25 ENCOUNTER — Telehealth: Payer: Self-pay

## 2023-04-25 MED ORDER — METRONIDAZOLE 500 MG PO TABS: 500.0000 mg | ORAL_TABLET | Freq: Two times a day (BID) | ORAL | 0 refills | Status: AC

## 2023-04-25 MED ORDER — FLUCONAZOLE 150 MG PO TABS: 150.0000 mg | ORAL_TABLET | Freq: Once | ORAL | 0 refills | Status: AC

## 2023-04-25 NOTE — Telephone Encounter (Signed)
Per protocol, pt requires tx with metronidazole and diflucan. Attempted to reach patient x2 with interpreter line. Tried both mobile and home numbers. Home number was reported to be a wrong number. Mobile, and primary number, listed is for pt's sponsor. LVM on mobile line.  Rx sent to pharmacy on file.

## 2023-06-01 ENCOUNTER — Other Ambulatory Visit: Payer: Self-pay

## 2023-06-01 ENCOUNTER — Ambulatory Visit (HOSPITAL_COMMUNITY)
Admission: RE | Admit: 2023-06-01 | Discharge: 2023-06-01 | Disposition: A | Discharge status: Home / Self Care | Payer: Self-pay | Source: Ambulatory Visit | Attending: Emergency Medicine

## 2023-06-01 ENCOUNTER — Encounter (HOSPITAL_COMMUNITY): Payer: Self-pay

## 2023-06-01 VITALS — BP 127/67 | HR 86 | Temp 97.9°F | Resp 16

## 2023-06-01 DIAGNOSIS — R519 Headache, unspecified: Secondary | ICD-10-CM | POA: Insufficient documentation

## 2023-06-01 DIAGNOSIS — Z1152 Encounter for screening for COVID-19: Secondary | ICD-10-CM | POA: Diagnosis not present

## 2023-06-01 LAB — POCT URINE PREGNANCY: Preg Test, Ur: NEGATIVE

## 2023-06-01 MED ORDER — METOCLOPRAMIDE HCL 5 MG/ML IJ SOLN: 5.0000 mg | Freq: Once | INTRAMUSCULAR | Status: DC

## 2023-06-01 MED ORDER — KETOROLAC TROMETHAMINE 30 MG/ML IJ SOLN
INTRAMUSCULAR | Status: AC
  Filled 2023-06-01: qty 1

## 2023-06-01 MED ORDER — KETOROLAC TROMETHAMINE 30 MG/ML IJ SOLN: 30.0000 mg | Freq: Once | INTRAMUSCULAR | Status: DC

## 2023-06-01 MED ORDER — EXCEDRIN MIGRAINE 250-250-65 MG PO TABS: 1.0000 | ORAL_TABLET | Freq: Four times a day (QID) | ORAL | 0 refills | Status: DC | PRN

## 2023-06-01 MED ORDER — DEXAMETHASONE SODIUM PHOSPHATE 10 MG/ML IJ SOLN: 10.0000 mg | Freq: Once | INTRAMUSCULAR | Status: DC

## 2023-06-01 MED ORDER — METOCLOPRAMIDE HCL 5 MG/ML IJ SOLN
5.0000 mg | Freq: Once | INTRAMUSCULAR | Status: AC
  Administered 2023-06-01: 5 mg via INTRAMUSCULAR

## 2023-06-01 MED ORDER — KETOROLAC TROMETHAMINE 30 MG/ML IJ SOLN
30.0000 mg | Freq: Once | INTRAMUSCULAR | Status: AC
  Administered 2023-06-01: 30 mg via INTRAMUSCULAR

## 2023-06-01 MED ORDER — METOCLOPRAMIDE HCL 5 MG/ML IJ SOLN
INTRAMUSCULAR | Status: AC
  Filled 2023-06-01: qty 2

## 2023-06-01 NOTE — Discharge Instructions (Addendum)
??? ???? ?? ?? ??? ?? ?????? ?? ????? ? ?? ??? ??? ? ????? ????? ??????? ?????. ??????? ???? ??? ?? ??? ?? ?? ?? ?? ???? ????? ??? ?????? ??? ?? ???? ???????   64 ???? ???? ???? ?? ??? ???? ????? ???. ?? ?? ???? ????? ? ?? ??? ?? ?? ???? ???? ? ??? ?? ???? ?????? ?? ???? ??? ??? ????? ???????? ???? ??????? ???? ???? ????? ????? ?? ? ???? ??????? ?????? ????.  ??? ?? ???? ??? ????? ????? ?? ???? ? COVID-19 ???? ???? ??. ??????? ???? ? Tylenol ?? ibuprofen ?? ??? ???? ? ?????? ?????? ?? ????? ?????? ??????? ???? ?? ?? ? ???? ???? ?????? ??? ?? ???? ??.  We have given you injections today in clinic to help with your headache.  Please go home and get some rest, ensure you are drinking at least 64 ounces of water, and managing your stress. If these medications do not resolve your headache, you develop any vision loss, or any new concerning symptoms please seek immediate care at the nearest emergency department.  We will contact you if the COVID-19 swab is positive.  Please continue symptomatic management alternating between Tylenol and ibuprofen, staying hydrated and getting plenty of rest if positive.

## 2023-06-01 NOTE — ED Provider Notes (Signed)
[facility]    CSN: [id number] Arrival date & time: 06/01/23  1519      History   Chief Complaint Chief Complaint  Patient presents with   Headache   Appt 1530    HPI Jessica Robbins is a 22 y.o. female.   Patient presents to clinic for evaluation of bad headache that has been present for the past month but worse over the past 5 or 6 days.  She takes care of 3 young children at home, and has her sister's 5 children staying with her currently because her sister recently had a stillborn.  Her husband works long shifts and she often has the children all day.  Reports she has not been getting much sleep recently.  She has tried ibuprofen and eating meals, these will work for short-term and then the headache resumes.  She did have dizziness and emesis yesterday when the headache was severe.  She has nausea when the headache is severe and reports her teeth will occasionally hurt.  Has bilateral ear pain.   Denies any light or noise sensitivity.  Did have a fever yesterday.  Denies sore throat.  Reports nasal congestion.  Denies sinus pain or pressure.  She is breast feeding.   Language interpreter used for this visit.  She is also accompanied by a friend who is fluent in [place].  The history is provided by the patient and medical records. The history is limited by a language barrier. A language interpreter was used.  Headache Associated symptoms: congestion, fever, nausea and vomiting   Associated symptoms: no abdominal pain, no cough, no diarrhea and no sore throat     Past Medical History:  Diagnosis Date   Anemia affecting pregnancy in third trimester-folate/b12 and Fe deficiency 06/28/2021   CBC  Latest Ref Rng & Units  06/27/2021  03/06/2021  02/27/2021  WBC  3.4 - 10.8 x10E3/uL  8.6  8.5  8.9  Hemoglobin  11.1 - 15.9 g/dL  4.0(J)  81.1  91.4  Hematocrit  34.0 - 46.6 %  29.4(L)  34.6  35.1  Platelets  150 - 450 x10E3/uL  302  329  324     Iron therapy recommended- Orders  in signed held for venoferx2  Folate deficiency-- sent in Folic acid 1g  B12 deficiency- sent injection to pharmacy-   Hyperthyroidism    Urinary tract infection with hematuria 10/05/2022    Patient Active Problem List   Diagnosis Date Noted   Irregular menses 02/23/2023   Injury of right shoulder 02/23/2023   Cervical radiculopathy 02/23/2023   Chronic bilateral low back pain without sciatica 10/25/2022   Chronic nonintractable headache 10/25/2022   Sad mood 10/25/2022   Carpal tunnel syndrome 10/05/2022   Dyshidrotic eczema 10/05/2022   Pruritic rash 10/05/2022   Weakness of both hands 09/29/2021   History of gestational hypertension 09/27/2021   Hypothyroidism 04/14/2021   History of IUFD 02/27/2021   Language barrier 02/27/2021    Past Surgical History:  Procedure Laterality Date   NO PAST SURGERIES      OB History     Gravida  4   Para  4   Term  3   Preterm  1   AB  0   Living  3      SAB  0   IAB  0   Ectopic  0   Multiple  0   Live Births  3  Home Medications    Prior to Admission medications   Medication Sig Start Date End Date Taking? Authorizing Provider  aspirin-acetaminophen-caffeine (EXCEDRIN MIGRAINE) [phone] MG tablet Take 1 tablet by mouth every 6 (six) hours as needed for headache. 06/01/23  Yes Rinaldo Ratel, [place] N, FNP  Norethindrone Acetate-Ethinyl Estrad-FE (LOESTRIN 24 FE) 1-20 MG-MCG(24) tablet Take 1 tablet by mouth daily. 10/18/22  Yes Lorriane Shire, MD  baclofen (LIORESAL) 10 MG tablet Take 1 tablet (10 mg total) by mouth 3 (three) times daily. 02/23/23   Valetta Close, MD  butalbital-acetaminophen-caffeine (FIORICET) [phone] MG tablet Take 2 tablets by mouth every 4 (four) hours as needed for headache. Patient not taking: Reported on 12/25/2021 09/29/21   Aviva Signs, CNM  DODEX 1000 MCG/ML injection  08/15/21   [provider]  ferrous sulfate (FERROUSUL) 325 (65 FE) MG tablet Take 1  tablet (325 mg total) by mouth every other day. Patient not taking: Reported on 12/25/2021 06/28/21   Anyanwu, Jethro Bastos, MD  ondansetron (ZOFRAN-ODT) 4 MG disintegrating tablet Take 1 tablet (4 mg total) by mouth every 8 (eight) hours as needed for nausea or vomiting. 04/24/23   Valentino Nose, NP  Prenatal Vit-Fe Fumarate-FA (MULTIVITAMIN-PRENATAL) 27-0.8 MG TABS tablet TAKE 1 TABLET BY MOUTH DAILY AT 12 NOON. Patient not taking: Reported on 12/25/2021 05/11/21   Allayne Stack, DO  triamcinolone cream (KENALOG) 0.1 % Apply 1 Application topically 2 (two) times daily. 10/04/22   Valetta Close, MD    Family History Family History  Problem Relation Age of Onset   Cancer Neg Hx    Diabetes Neg Hx    Hypertension Neg Hx     Social History Social History   Tobacco Use   Smoking status: Never   Smokeless tobacco: Never  Vaping Use   Vaping status: Never Used  Substance Use Topics   Alcohol use: Never   Drug use: Never     Allergies   Patient has no known allergies.   Review of Systems Review of Systems  Constitutional:  Positive for fever.  HENT:  Positive for congestion and rhinorrhea. Negative for sore throat.   Respiratory:  Negative for cough and shortness of breath.   Cardiovascular:  Negative for chest pain.  Gastrointestinal:  Positive for nausea and vomiting. Negative for abdominal pain and diarrhea.  Genitourinary:  Negative for menstrual problem.  Neurological:  Positive for headaches.     Physical Exam Triage Vital Signs ED Triage Vitals [06/01/23 1604]  Encounter Vitals Group     BP 127/67     Systolic BP Percentile      Diastolic BP Percentile      Pulse Rate 86     Resp 16     Temp 97.9 F (36.6 C)     Temp Source Oral     SpO2 96 %     Weight      Height      Head Circumference      Peak Flow      Pain Score      Pain Loc      Pain Education      Exclude from Growth Chart    No data found.  Updated Vital Signs BP 127/67   Pulse  86   Temp 97.9 F (36.6 C) (Oral)   Resp 16   SpO2 96%   Breastfeeding Yes   Visual Acuity Right Eye Distance:   Left Eye Distance:   Bilateral Distance:  Right Eye Near:   Left Eye Near:    Bilateral Near:     Physical Exam Vitals and nursing note reviewed.  Constitutional:      Appearance: Normal appearance. She is well-developed.  HENT:     Head: Normocephalic and atraumatic.     Right Ear: Tympanic membrane, ear canal and external ear normal.     Left Ear: Tympanic membrane, ear canal and external ear normal.     Nose: Nose normal.  Eyes:     Pupils: Pupils are equal, round, and reactive to light.  Cardiovascular:     Rate and Rhythm: Normal rate and regular rhythm.     Heart sounds: Normal heart sounds. No murmur heard. Pulmonary:     Effort: Pulmonary effort is normal. No respiratory distress.     Breath sounds: Normal breath sounds.  Musculoskeletal:        General: Normal range of motion.     Cervical back: Normal range of motion.  Skin:    General: Skin is warm and dry.  Neurological:     General: No focal deficit present.     Mental Status: She is alert and oriented to person, place, and time.     GCS: GCS eye subscore is 4. GCS verbal subscore is 5. GCS motor subscore is 6.  Psychiatric:        Mood and Affect: Mood normal.        Behavior: Behavior normal.      UC Treatments / Results  Labs (all labs ordered are listed, but only abnormal results are displayed) Labs Reviewed  SARS CORONAVIRUS 2 (TAT 6-24 HRS)  POCT URINE PREGNANCY    EKG   Radiology No results found.  Procedures Procedures (including critical care time)  Medications Ordered in UC Medications  ketorolac (TORADOL) 30 MG/ML injection 30 mg (30 mg Intramuscular Given 06/01/23 1706)  metoCLOPramide (REGLAN) injection 5 mg (5 mg Intramuscular Given 06/01/23 1706)    Initial Impression / Assessment and Plan / UC Course  I have reviewed the triage vital signs and the nursing  notes.  Pertinent labs & imaging results that were available during my care of the patient were reviewed by me and considered in my medical decision making (see chart for details).  Vitals and triage reviewed, patient is hemodynamically stable.  Lungs are vesicular and heart with regular rate and rhythm.  Bilateral auditory canals are clear, tympanic membranes are pearly gray.  Posterior pharynx with slight erythema and postnasal drip.  Without sinus tenderness to palpation. PERRLA.  Will treat with migraine cocktail for headache and Excedrin Migraine as needed.  Mixed reviews on safety during breast-feeding, RBA for one-time dose for severe HA and given cocktail. emergency precautions if cocktail does not relieve headache.  Will check COVID-19 for viral etiology to fever, ear pain and congestion they recently started.  Encouraged stress reduction and adequate sleep.  Plan of care, follow-up care and return precautions given, no questions at this time.    Final Clinical Impressions(s) / UC Diagnoses   Final diagnoses:  Bad headache     Discharge Instructions      ??? ???? ?? ?? ??? ?? ?????? ?? ????? ? ?? ??? ??? ? ????? ????? ??????? ?????. ??????? ???? ??? ?? ??? ?? ?? ?? ?? ???? ????? ??? ?????? ??? ?? ???? ??????? 64 ???? ???? ???? ?? ??? ???? ????? ???. ?? ?? ???? ????? ? ?? ??? ?? ?? ???? ???? ? ??? ?? ???? ?????? ?? ???? ??? ??? ????? ???????? ???? ??????? ???? ???? ????? ????? ?? ? ???? ??????? ?????? ????.  ??? ?? ???? ??? ????? ????? ?? ???? ?  COVID-19 ???? ???? ??. ??????? ???? ? Tylenol ?? ibuprofen ?? ??? ???? ? ?????? ?????? ?? ????? ?????? ??????? ???? ?? ?? ? ???? ???? ?????? ??? ?? ???? ??.  We have given you injections today in clinic to help with your headache.  Please go home and get some rest, ensure you are drinking at least 64 ounces of water, and managing your stress. If these medications do not resolve your headache, you develop any vision loss, or any new concerning  symptoms please seek immediate care at the nearest emergency department.  We will contact you if the COVID-19 swab is positive.  Please continue symptomatic management alternating between Tylenol and ibuprofen, staying hydrated and getting plenty of rest if positive.      ED Prescriptions     Medication Sig Dispense Auth. Provider   aspirin-acetaminophen-caffeine (EXCEDRIN MIGRAINE) [phone] MG tablet Take 1 tablet by mouth every 6 (six) hours as needed for headache. 30 tablet Jayveon Convey, [place], [place]      PDMP not reviewed this encounter.   Stephenie Navejas, [place] N, [place] 06/01/23 [phone]

## 2023-06-01 NOTE — ED Triage Notes (Signed)
Via audio interpreter: C/O HA x 1 month with worsening over past 5-6 days. States HA "moves around" to different areas of her head. Describes pain as constant, but worse "when I don't get any sleep". Has tried IBU without relief. Also c/o "tingling sensation" in head at times.  Pt also c/o dizziness today, and had some vomiting today, along with chills and fever, rhinorrhea, watery eyes.

## 2023-06-02 LAB — SARS CORONAVIRUS 2 (TAT 6-24 HRS): SARS Coronavirus 2: NEGATIVE

## 2023-06-03 ENCOUNTER — Ambulatory Visit (HOSPITAL_COMMUNITY): Payer: Medicaid Other

## 2023-07-29 ENCOUNTER — Emergency Department (HOSPITAL_COMMUNITY)
Admission: EM | Admit: 2023-07-29 | Discharge: 2023-07-29 | Disposition: A | Discharge status: Home / Self Care | Payer: Medicaid Other | Attending: Emergency Medicine | Admitting: Emergency Medicine

## 2023-07-29 ENCOUNTER — Emergency Department (HOSPITAL_COMMUNITY): Payer: Medicaid Other

## 2023-07-29 ENCOUNTER — Other Ambulatory Visit: Payer: Self-pay

## 2023-07-29 DIAGNOSIS — Q048 Other specified congenital malformations of brain: Secondary | ICD-10-CM | POA: Diagnosis not present

## 2023-07-29 DIAGNOSIS — I6523 Occlusion and stenosis of bilateral carotid arteries: Secondary | ICD-10-CM | POA: Diagnosis not present

## 2023-07-29 DIAGNOSIS — R946 Abnormal results of thyroid function studies: Secondary | ICD-10-CM | POA: Diagnosis not present

## 2023-07-29 DIAGNOSIS — Z20822 Contact with and (suspected) exposure to covid-19: Secondary | ICD-10-CM | POA: Diagnosis not present

## 2023-07-29 DIAGNOSIS — R200 Anesthesia of skin: Secondary | ICD-10-CM | POA: Insufficient documentation

## 2023-07-29 DIAGNOSIS — R4182 Altered mental status, unspecified: Secondary | ICD-10-CM | POA: Insufficient documentation

## 2023-07-29 DIAGNOSIS — R519 Headache, unspecified: Secondary | ICD-10-CM | POA: Diagnosis not present

## 2023-07-29 DIAGNOSIS — R0989 Other specified symptoms and signs involving the circulatory and respiratory systems: Secondary | ICD-10-CM | POA: Diagnosis not present

## 2023-07-29 DIAGNOSIS — R55 Syncope and collapse: Secondary | ICD-10-CM | POA: Diagnosis not present

## 2023-07-29 DIAGNOSIS — R7989 Other specified abnormal findings of blood chemistry: Secondary | ICD-10-CM

## 2023-07-29 LAB — COMPREHENSIVE METABOLIC PANEL
ALT: 17 U/L (ref 0–44)
AST: 21 U/L (ref 15–41)
Albumin: 3.5 g/dL (ref 3.5–5.0)
Alkaline Phosphatase: 54 U/L (ref 38–126)
Anion gap: 11 (ref 5–15)
BUN: 11 mg/dL (ref 6–20)
CO2: 19 mmol/L — ABNORMAL LOW (ref 22–32)
Calcium: 9.3 mg/dL (ref 8.9–10.3)
Chloride: 105 mmol/L (ref 98–111)
Creatinine, Ser: 0.65 mg/dL (ref 0.44–1.00)
GFR, Estimated: >60 mL/min (ref >60)
Glucose, Bld: 126 mg/dL — ABNORMAL HIGH (ref 70–99)
Potassium: 3.6 mmol/L (ref 3.5–5.1)
Sodium: 135 mmol/L (ref 135–145)
Total Bilirubin: 0.6 mg/dL (ref <1.2)
Total Protein: 7.1 g/dL (ref 6.5–8.1)

## 2023-07-29 LAB — CBG MONITORING, ED: Glucose-Capillary: 122 mg/dL — ABNORMAL HIGH (ref 70–99)

## 2023-07-29 LAB — CBC WITH DIFFERENTIAL/PLATELET
Abs Immature Granulocytes: 0.03 10*3/uL (ref 0.00–0.07)
Basophils Absolute: 0 10*3/uL (ref 0.0–0.1)
Basophils Relative: 0 %
Eosinophils Absolute: 0.3 10*3/uL (ref 0.0–0.5)
Eosinophils Relative: 3 %
HCT: 35 % — ABNORMAL LOW (ref 36.0–46.0)
Hemoglobin: 12.1 g/dL (ref 12.0–15.0)
Immature Granulocytes: 0 %
Lymphocytes Relative: 40 %
Lymphs Abs: 3.7 10*3/uL (ref 0.7–4.0)
MCH: 28.4 pg (ref 26.0–34.0)
MCHC: 34.6 g/dL (ref 30.0–36.0)
MCV: 82.2 fL (ref 80.0–100.0)
Monocytes Absolute: 0.7 10*3/uL (ref 0.1–1.0)
Monocytes Relative: 8 %
Neutro Abs: 4.5 10*3/uL (ref 1.7–7.7)
Neutrophils Relative %: 49 %
Platelets: 301 10*3/uL (ref 150–400)
RBC: 4.26 MIL/uL (ref 3.87–5.11)
RDW: 12.7 % (ref 11.5–15.5)
WBC: 9.2 10*3/uL (ref 4.0–10.5)
nRBC: 0 % (ref 0.0–0.2)

## 2023-07-29 LAB — URINALYSIS, ROUTINE W REFLEX MICROSCOPIC
Bilirubin Urine: NEGATIVE
Glucose, UA: NEGATIVE mg/dL
Hgb urine dipstick: NEGATIVE
Ketones, ur: NEGATIVE mg/dL
Leukocytes,Ua: NEGATIVE
Nitrite: NEGATIVE
Protein, ur: NEGATIVE mg/dL
Specific Gravity, Urine: 1.043 — ABNORMAL HIGH (ref 1.005–1.030)
pH: 6 (ref 5.0–8.0)

## 2023-07-29 LAB — RAPID URINE DRUG SCREEN, HOSP PERFORMED
Amphetamines: NOT DETECTED
Barbiturates: NOT DETECTED
Benzodiazepines: NOT DETECTED
Cocaine: NOT DETECTED
Opiates: NOT DETECTED
Tetrahydrocannabinol: NOT DETECTED

## 2023-07-29 LAB — HCG, SERUM, QUALITATIVE: Preg, Serum: NEGATIVE

## 2023-07-29 LAB — RESP PANEL BY RT-PCR (RSV, FLU A&B, COVID)  RVPGX2
Influenza A by PCR: NEGATIVE
Influenza B by PCR: NEGATIVE
Resp Syncytial Virus by PCR: NEGATIVE
SARS Coronavirus 2 by RT PCR: NEGATIVE

## 2023-07-29 LAB — TROPONIN I (HIGH SENSITIVITY)
Troponin I (High Sensitivity): 3 ng/L (ref <18)
Troponin I (High Sensitivity): 3 ng/L (ref <18)

## 2023-07-29 LAB — D-DIMER, QUANTITATIVE: D-Dimer, Quant: 0.29 ug{FEU}/mL (ref 0.00–0.50)

## 2023-07-29 LAB — ETHANOL: Alcohol, Ethyl (B): <10 mg/dL (ref <10)

## 2023-07-29 LAB — TSH: TSH: 5.299 u[IU]/mL — ABNORMAL HIGH (ref 0.350–4.500)

## 2023-07-29 LAB — PROTIME-INR
INR: 1 (ref 0.8–1.2)
Prothrombin Time: 13.3 s (ref 11.4–15.2)

## 2023-07-29 MED ORDER — IOHEXOL 350 MG/ML SOLN
75.0000 mL | Freq: Once | INTRAVENOUS | Status: AC | PRN
  Administered 2023-07-29: 75 mL via INTRAVENOUS

## 2023-07-29 MED ORDER — GADOBUTROL 1 MMOL/ML IV SOLN
7.5000 mL | Freq: Once | INTRAVENOUS | Status: AC | PRN
  Administered 2023-07-29: 7.5 mL via INTRAVENOUS

## 2023-07-29 MED ORDER — ONDANSETRON HCL 4 MG/2ML IJ SOLN
4.0000 mg | Freq: Once | INTRAMUSCULAR | Status: AC
  Administered 2023-07-29: 4 mg via INTRAVENOUS

## 2023-07-29 MED ORDER — LORAZEPAM 2 MG/ML IJ SOLN
1.0000 mg | INTRAMUSCULAR | Status: DC | PRN
  Administered 2023-07-29: 1 mg via INTRAVENOUS
  Filled 2023-07-29: qty 1

## 2023-07-29 MED ORDER — LORAZEPAM 2 MG/ML IJ SOLN: 1.0000 mg | Freq: Once | INTRAMUSCULAR | Status: DC

## 2023-07-29 NOTE — Discharge Instructions (Signed)
???? ?? ? Syncopal ???? ????? ? ????? ????? ???? ???. ????? ? ????????? ??? ????? ? ??? ? TSH ???? ??? ?? ?????.  ?? ???? ????? ? ??? ????? ???? ??.  ? ???? ?????? ????? ????? ? ?????? ??????? ???? ????? ??? ????? ???. ????? ??? ????? ????? ? ?? ??? ???? ????????? ??? ?? ???. ????? ???: ?????????.  ??? ???? ???? ?? ??? ???? ?? ?? ???????? ???? ?? ? ????? ??????? ????? ???.  ???? ???? ? ??? TSH ??? ?? ??? ? ?????? ??????? ???? ????? ??? ?? ????? ???.  ?? ? ??????? ??????? ??. ??????? ???? ?????? ??? ??????? ?????? ?? ???? ????? ??? ????? ???????? ???:  ???? ?? ???? ????.  ???? ??? ?? ???? ?? ? ?? ???? ???? ?????? ??? ??? ????.  ???? ? ?? ??? ????? ??? ?? ???? ????? ? ??? ?????? ?? ???? ???: ?? ?? ??? ?? ???? ?? ????? ?? ? ????? ??? ???? ???? ???? ????? ??? ?? ? ?????? ? ????? ?????? ???? ???. ??????? ???? ????? ???????? ????? ?? ????? ???.  ??? ???? ?? ???? ?? ???? ???? ???????? ?? ? ????? ?? ???? ?? ????? ?? ????? ? ?? ?? ???? ?? ???? ??? 911 ?? ??? ???? ?? ? ????? ??? ??????? ?????? ???? ?? ?? ??? ??.  ???? ???? ?? ??? ????????? ??????? ?? ? ??? ??????? ????? ?? ???? ??? ????? ?? MyChart ???? ?? ??????? ????.  ??????? ???? ? ???? ????? ?? ??? ?? ???? ??? ? ??? ?????? ??????? ???? ????? ?? ???? ????? ??? ????? ? ????? ????? ?? ??? ????.  ??????: ? ?? ??? ???? ???? ???? ? ?? ??????? ??????? ?? ?????? ??? ???? ??? ??. ? ??? ? ????? ???? ????? ??? ??? ??? ??? ?? ?? ??? ?? ?????? ???????? ???? ?? ???? ???? ??? ?? ???? ????.  You have been seen today for your complaint of syncopal episode. Your lab work was reassuring but did show an elevated TSH level.  This may be a cause for your symptoms.  Follow-up with your primary care provider for further assessment. Your imaging was reassuring and shows no acute abnormalities. Follow up with: Neurology.  They should call you to schedule an appointment within the next week.  You should also follow-up with a primary care provider regarding your TSH level.  This  is a thyroid test. Please seek immediate medical care if you develop any of the following symptoms: You faint. You hit your head or are injured after fainting. You have any of these symptoms that may indicate trouble with your heart: Fast or irregular heartbeats (palpitations). Unusual pain in your chest, abdomen, or back. Shortness of breath. You have a seizure. You have a severe headache. You are confused. You have vision problems. You have severe weakness or trouble walking. You are bleeding from your mouth or rectum, or you have black or tarry stool. At this time there does not appear to be the presence of an emergent medical condition, however there is always the potential for conditions to change. Please read and follow the below instructions.  Do not take your medicine if  develop an itchy rash, swelling in your mouth or lips, or difficulty breathing; call 911 and seek immediate emergency medical attention if this occurs.  You may review your lab tests and imaging results in their entirety on your MyChart account.  Please discuss all results of fully with your primary care provider and other specialist at your follow-up visit.  Note: Portions  of this text may have been transcribed using voice recognition software. Every effort was made to ensure accuracy; however, inadvertent computerized transcription errors may still be present.

## 2023-07-29 NOTE — ED Provider Notes (Signed)
  Physical Exam  BP 114/89   Pulse 71   Temp 97.8 F (36.6 C)   Resp 18   SpO2 100%   Physical Exam Vitals and nursing note reviewed.  Constitutional:      General: She is not in acute distress.    Appearance: Normal appearance. She is normal weight. She is not ill-appearing.  HENT:     Head: Normocephalic and atraumatic.  Pulmonary:     Effort: Pulmonary effort is normal. No respiratory distress.  Abdominal:     General: Abdomen is flat.  Musculoskeletal:        General: Normal range of motion.     Cervical back: Neck supple.  Skin:    General: Skin is warm and dry.  Neurological:     Mental Status: She is alert and oriented to person, place, and time.  Psychiatric:        Mood and Affect: Mood normal.        Behavior: Behavior normal.     Procedures  Procedures  ED Course / MDM   Clinical Course as of 07/29/23 0744  Mon Jul 29, 2023  1610 Reassess after MRI, consult neuro if abnormal, DC if normal with neuro follow up [AS]    Clinical Course User Index [AS] Lula Olszewski Edsel Petrin, PA-C   Medical Decision Making Amount and/or Complexity of Data Reviewed Labs: ordered. Radiology: ordered. ECG/medicine tests: ordered.  Risk Prescription drug management.  Assumed care at shift change from previous provider. Please see previous note for full HPI. In short, 22 year old female presenting for syncopal episode. Developed bilateral lower extremity paresthesias below level of the knee while in the department.  Lab workup reassuring.  CT head and CTA head and neck negative.  Plan is to reassess after MRI. If negative, will be stable for discharge home with neurology follow up. If abnormal, re-consult neurology.   0730-MRI brain and cervical spine with and without contrast largely unremarkable.  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  TSH mildly elevated at 5.299 raising concern for hypothyroidism which may be an etiology for her symptoms.  Patient  states that her bilateral lower extremity paresthesias have resolved on my evaluation.  She feels slightly lightheaded but is otherwise back to her baseline.  Will have patient follow-up with neurology outpatient.  She is encouraged to follow-up with her primary care provider regarding her TSH level.  Low suspicion for myxedema coma or thyroid storm at this time.  At this time there does not appear to be any evidence of an acute emergency medical condition and the patient appears stable for discharge with appropriate outpatient follow up. Diagnosis was discussed with patient who verbalizes understanding of care plan and is agreeable to discharge. I have discussed return precautions with patient who verbalizes understanding. Patient encouraged to follow-up with their PCP within 1 week. All questions answered.  Note: Portions of this report may have been transcribed using voice recognition software. Every effort was made to ensure accuracy; however, inadvertent computerized transcription errors may still be present.   Michelle Piper, PA-C 07/29/23 9604    Vanetta Mulders, MD 08/03/23 [phone]

## 2023-07-29 NOTE — ED Provider Notes (Signed)
[place] EMERGENCY DEPARTMENT AT [facility] Provider Note   CSN: [id number] Arrival date & time: 07/29/23  0050     History  Chief Complaint  Patient presents with   Loss of Consciousness   History provided by the patient's husband with the assistance of Pashto interpreter number [phone].  Level 5 caveat secondary to patient's altered mental status upon arrival and inability contributory history.  Jessica Robbins is a 22 y.o. female presents to the ED via POV with her husband at the bedside following syncopal episode at home.  Per patient's husband she was sitting on the ground at their home with her family when she subsequently syncopized.  He states that she was unconscious for 4 to 5 minutes but breathing on her own.  When he was able to arouse her she was very lethargic.  Complaining of bilateral lower extremity numbness below the knees and sensation that her left arm and leg are heavy.  No history of DVT or known intracranial abnormality.  I have reviewed her medical records.  She is history of hypothyroidism.  Patient is on no medications daily.  HPI     Home Medications Prior to Admission medications   Medication Sig Start Date End Date Taking? Authorizing Provider  aspirin-acetaminophen-caffeine (EXCEDRIN MIGRAINE) [phone] MG tablet Take 1 tablet by mouth every 6 (six) hours as needed for headache. 06/01/23   Garrison, [place] N, FNP  baclofen (LIORESAL) 10 MG tablet Take 1 tablet (10 mg total) by mouth 3 (three) times daily. 02/23/23   Valetta Close, MD  butalbital-acetaminophen-caffeine (FIORICET) [phone] MG tablet Take 2 tablets by mouth every 4 (four) hours as needed for headache. Patient not taking: Reported on 12/25/2021 09/29/21   Aviva Signs, CNM  DODEX 1000 MCG/ML injection  08/15/21   [provider]  ferrous sulfate (FERROUSUL) 325 (65 FE) MG tablet Take 1 tablet (325 mg total) by mouth every other day. Patient not taking: Reported on  12/25/2021 06/28/21   Tereso Newcomer, MD  Norethindrone Acetate-Ethinyl Estrad-FE (LOESTRIN 24 FE) 1-20 MG-MCG(24) tablet Take 1 tablet by mouth daily. 10/18/22   Lorriane Shire, MD  ondansetron (ZOFRAN-ODT) 4 MG disintegrating tablet Take 1 tablet (4 mg total) by mouth every 8 (eight) hours as needed for nausea or vomiting. 04/24/23   Valentino Nose, NP  Prenatal Vit-Fe Fumarate-FA (MULTIVITAMIN-PRENATAL) 27-0.8 MG TABS tablet TAKE 1 TABLET BY MOUTH DAILY AT 12 NOON. Patient not taking: Reported on 12/25/2021 05/11/21   Allayne Stack, DO  triamcinolone cream (KENALOG) 0.1 % Apply 1 Application topically 2 (two) times daily. 10/04/22   Valetta Close, MD      Allergies    Patient has no known allergies.    Review of Systems   Review of Systems  Unable to perform ROS: Acuity of condition  Neurological:  Positive for syncope and numbness.    Physical Exam Updated Vital Signs BP 114/89   Pulse 71   Temp 97.8 F (36.6 C)   Resp 18   SpO2 100%  Physical Exam Vitals and nursing note reviewed.  Constitutional:      Appearance: She is ill-appearing. She is not toxic-appearing.  HENT:     Head: Normocephalic and atraumatic.     Mouth/Throat:     Mouth: Mucous membranes are moist.     Pharynx: No oropharyngeal exudate or posterior oropharyngeal erythema.  Eyes:     General:        Right eye: No discharge.  Left eye: No discharge.     Extraocular Movements: Extraocular movements intact.     Conjunctiva/sclera: Conjunctivae normal.     Pupils: Pupils are equal, round, and reactive to light.  Cardiovascular:     Rate and Rhythm: Normal rate and regular rhythm.     Pulses: Normal pulses.     Heart sounds: Normal heart sounds. No murmur heard. Pulmonary:     Effort: Pulmonary effort is normal. No respiratory distress.     Breath sounds: Normal breath sounds. No wheezing or rales.  Abdominal:     General: Bowel sounds are normal. There is no distension.      Palpations: Abdomen is soft.     Tenderness: There is no abdominal tenderness. There is no guarding or rebound.  Musculoskeletal:        General: No deformity.     Cervical back: Neck supple.  Skin:    General: Skin is warm and dry.     Capillary Refill: Capillary refill takes less than 2 seconds.  Neurological:     General: No focal deficit present.     Mental Status: She is oriented to person, place, and time. Mental status is at baseline. She is lethargic.     GCS: GCS eye subscore is 4. GCS verbal subscore is 5. GCS motor subscore is 6.     Cranial Nerves: Cranial nerves 2-12 are intact.     Sensory: Sensory deficit present.     Motor: Motor function is intact.     Coordination: Coordination is intact.     Gait: Gait is intact.     Deep Tendon Reflexes:     Reflex Scores:      Patellar reflexes are 2+ on the right side and 2+ on the left side.      Achilles reflexes are 2+ on the right side and 2+ on the left side.    Comments: Patient reporting subjective sensory deficit in bilateral lower extremities distal to the knees.  Reports decreased sensation no sensation is symmetric.  Symmetric strength in grip, elbow flexion extension bilaterally.  Symmetric strength in knee flexion and extension and plantar/dorsiflexion.  Psychiatric:        Mood and Affect: Mood normal.     ED Results / Procedures / Treatments   Labs (all labs ordered are listed, but only abnormal results are displayed) Labs Reviewed  CBC WITH DIFFERENTIAL/PLATELET - Abnormal; Notable for the following components:      Result Value   HCT 35.0 (*)    All other components within normal limits  COMPREHENSIVE METABOLIC PANEL - Abnormal; Notable for the following components:   CO2 19 (*)    Glucose, Bld 126 (*)    All other components within normal limits  URINALYSIS, ROUTINE W REFLEX MICROSCOPIC - Abnormal; Notable for the following components:   Color, Urine STRAW (*)    Specific Gravity, Urine 1.043 (*)     All other components within normal limits  TSH - Abnormal; Notable for the following components:   TSH 5.299 (*)    All other components within normal limits  CBG MONITORING, ED - Abnormal; Notable for the following components:   Glucose-Capillary 122 (*)    All other components within normal limits  RESP PANEL BY RT-PCR (RSV, FLU A&B, COVID)  RVPGX2  HCG, SERUM, QUALITATIVE  PROTIME-INR  RAPID URINE DRUG SCREEN, HOSP PERFORMED  ETHANOL  D-DIMER, QUANTITATIVE  CBG MONITORING, ED  TROPONIN I (HIGH SENSITIVITY)  TROPONIN I (HIGH SENSITIVITY)  EKG EKG Interpretation Date/Time:  Monday July 29 2023 01:17:48 EST Ventricular Rate:  85 PR Interval:  172 QRS Duration:  82 QT Interval:  370 QTC Calculation: 440 R Axis:   113  Text Interpretation: Normal sinus rhythm Right axis deviation Low voltage QRS Nonspecific T wave abnormality Abnormal ECG No previous ECGs available Confirmed by Marily Memos [phone]) on 07/29/2023 1:54:30 AM  Radiology CT ANGIO HEAD NECK W WO CM  Result Date: 07/29/2023 CLINICAL DATA:  Syncopal event, headache, loss of consciousness EXAM: CT ANGIOGRAPHY HEAD AND NECK WITH AND WITHOUT CONTRAST TECHNIQUE: Multidetector CT imaging of the head and neck was performed using the standard protocol during bolus administration of intravenous contrast. Multiplanar CT image reconstructions and MIPs were obtained to evaluate the vascular anatomy. Carotid stenosis measurements (when applicable) are obtained utilizing NASCET criteria, using the distal internal carotid diameter as the denominator. RADIATION DOSE REDUCTION: This exam was performed according to the departmental dose-optimization program which includes automated exposure control, adjustment of the mA and/or kV according to patient size and/or use of iterative reconstruction technique. CONTRAST:  75mL OMNIPAQUE IOHEXOL 350 MG/ML SOLN COMPARISON:  None Available. FINDINGS: CT HEAD FINDINGS Brain: No evidence of acute  infarct, hemorrhage, mass, mass effect, or midline shift. No hydrocephalus or extra-axial fluid collection. Partial empty sella. Craniocervical junction within normal limits. Vascular: No hyperdense vessel. Skull: Negative for fracture or focal lesion. Sinuses/Orbits: Mild mucosal thickening in the maxillary sinuses. 3 no acute finding in the orbits. Other: The mastoid air cells are well aerated. CTA NECK FINDINGS Evaluation is somewhat limited by motion, despite repeat scanning. Aortic arch: Four-vessel arch, with the left vertebral artery originating from the aorta. Imaged portion shows no evidence of aneurysm or dissection. No significant stenosis of the major arch vessel origins. Right carotid system: No evidence of dissection, occlusion, or hemodynamically significant stenosis (greater than 50%). Left carotid system: No evidence of dissection, occlusion, or hemodynamically significant stenosis (greater than 50%). Vertebral arteries: No evidence of dissection, occlusion, or hemodynamically significant stenosis (greater than 50%). Skeleton: No acute osseous abnormality. Other neck: No acute finding. Upper chest: No focal pulmonary opacity or pleural effusion. Review of the MIP images confirms the above findings CTA HEAD FINDINGS Evaluation is significantly limited by motion on the first scan. Evaluation is somewhat limited by bolus timing and motion on a repeat scan. Within these limitations, no large vessel occlusion is seen. The intracranial ICAs are patent significant stenosis. The proximal ACA, MCA, and PCA branches are patent. The vertebral arteries are patent to the vertebrobasilar junction without significant stenosis. The venous sinuses appear well opacified, without evidence of occlusion. There is narrowing of distal transverse sinuses near the transverse-sigmoid junction. IMPRESSION: 1. Evaluation is somewhat limited by motion and bolus timing. Within these limitations, no large vessel occlusion or  significant stenosis in the head or neck. 2. No acute intracranial process. 3. Partial empty sella and narrowing of distal transverse sinuses near the transverse-sigmoid junction, which can be seen in the setting of idiopathic intracranial hypertension. Electronically Signed   By: Wiliam Ke M.D.   On: 07/29/2023 01:57   DG Chest Portable 1 View  Result Date: 07/29/2023 CLINICAL DATA:  Syncopal episode. EXAM: PORTABLE CHEST 1 VIEW COMPARISON:  None Available. FINDINGS: The heart size and mediastinal contours are within normal limits. Low lung volumes are noted with breast attenuation artifact seen overlying the bilateral lung bases. There is no evidence of an acute infiltrate, pleural effusion or pneumothorax. The visualized skeletal structures  are unremarkable. IMPRESSION: No active disease. Electronically Signed   By: Aram Candela M.D.   On: 07/29/2023 01:54    Procedures .Critical Care  Performed by: Paris Lore, PA-C Authorized by: Paris Lore, PA-C   Critical care provider statement:    Critical care time (minutes):  75   Critical care was time spent personally by me on the following activities:  Development of treatment plan with patient or surrogate, discussions with consultants, evaluation of patient's response to treatment, examination of patient, obtaining history from patient or surrogate, ordering and performing treatments and interventions, ordering and review of laboratory studies, ordering and review of radiographic studies, pulse oximetry and re-evaluation of patient's condition     Medications Ordered in ED Medications  LORazepam (ATIVAN) injection 1 mg (1 mg Intravenous Given 07/29/23 0559)  iohexol (OMNIPAQUE) 350 MG/ML injection 75 mL (75 mLs Intravenous Contrast Given 07/29/23 0147)  ondansetron (ZOFRAN) injection 4 mg (4 mg Intravenous Given 07/29/23 0156)  gadobutrol (GADAVIST) 1 MMOL/ML injection 7.5 mL (7.5 mLs Intravenous Contrast Given  07/29/23 1914)    ED Course/ Medical Decision Making/ A&P Clinical Course as of 07/29/23 0703  Mon Jul 29, 2023  0634 Reassess after MRI, consult neuro if abnormal, DC if normal with neuro follow up [AS]    Clinical Course User Index [AS] Michelle Piper, PA-C                                 Medical Decision Making 22 year old female who presents with concern for syncopal episode followed by report of numbness in the lower extremities.  Ill-appearing at time of my initial evaluation of the patient and triage.  Code medical activated for CT angiogram head and neck due to concern for possible brain bleed.  Vitals are normal on intake.  Cardiopulmonary exam unremarkable, abdominal exam is benign.  Patient initially was quite lethargic, however nonfocal neuroexam with exception of subjective deficit and sensation in bilateral lower extremities distal to the knee.  DDx includes not limited to dysrhythmia, ACS, PE,, CVA, demyelinating disease, brain bleed.  Of note patient did report developing chest pain while in the CT scanner for CT angiogram head and neck, however patient also became quite claustrophobic and was tearful, moving around significantly in the scanner  compromising imaging.  Amount and/or Complexity of Data Reviewed Labs: ordered.    Details: CBC unremarkable, CMP unremarkable, CBG 122.  Patient is not pregnant.  UDS pan negative UA unremarkable, dimer is negative, alcohol is negative, RVP is negative, troponin is negative x 2, 3.  INR is normal.  TSH elevated at 5.29.  Radiology: ordered.    Details:  Chest x-ray negative for acute cardiopulmonary disease.  CTA head and neck limited secondary to motion and bolus timing but no LVO or significant stenosis in the head or neck.  No acute intracranial process.  Partial empty sella, can be seen in IIH. ECG/medicine tests: ordered.  Risk Prescription drug management.    Case was discussed with Dr. Derry Lory, neurology who  recommends MRI brain and C-spine with and without contrast to evaluate for possible demyelinating disease.  Also recommends TSH.  Patient's mental status significantly improved throughout her stay in the emergency department.  She did have a few episodes of NBNB emesis in the ED which improved after antiemetic.  Patient primarily became nauseated during CT imaging.  Additionally became nauseated whenever it was discussed that she will  require MRI of the brain and C-spine.  Question contribution of anxiety to this presentation given lack of nausea vomiting or emesis unrelated to imaging.  Care of this patient signed out to oncoming ED provider A. Schutt, PA-C at time of shift change. All pertinent HPI, physical exam, and laboratory findings were discussed with them prior to my departure. Disposition of patient pending completion of workup, reevaluation, and clinical judgement of oncoming ED provider.    If MRIs are reassuring, may follow-up in the outpatient setting with both cardiology and neurology.  This chart was dictated using voice recognition software, Dragon. Despite the best efforts of this provider to proofread and correct errors, errors may still occur which can change documentation meaning.        Final Clinical Impression(s) / ED Diagnoses Final diagnoses:  None    Rx / DC Orders ED Discharge Orders     None         Sherrilee Gilles 07/29/23 0703    Mesner, Barbara Cower, MD 07/29/23 2257

## 2023-07-29 NOTE — ED Triage Notes (Signed)
Pt arrives to ED c/o syncopal event while at home. Pt reports having HA while sitting on ground and then losing consciousness for 3 mins. Pt endorses n/v when waking up. Pt's family member reports that pt regained consciousness soon afterwards, but has been lethargic since. Pt reports that she has sensory deficits in limbs. Pt speaks Pashto

## 2023-07-29 NOTE — ED Notes (Signed)
Patient transported to MRI 

## 2023-07-29 NOTE — ED Notes (Signed)
Orthostatic Vital Signs   Orthostatic VS for the past 24 hrs:  BP- Lying Pulse- Lying BP- Sitting Pulse- Sitting BP- Standing at 0 minutes Pulse- Standing at 0 minutes  07/29/23 0533 112/67 67 97/65 74 106/81 90   Pt endorses dizziness described as like the room is spinning when changing positions. Was able to stand and change positions independently.    PA Loleta Dicker informed of finding.

## 2023-07-29 NOTE — ED Notes (Signed)
XR at bedside

## 2023-07-30 ENCOUNTER — Encounter: Payer: Self-pay | Admitting: Neurology

## 2023-09-13 ENCOUNTER — Encounter: Payer: Self-pay | Admitting: Neurology

## 2023-09-13 ENCOUNTER — Ambulatory Visit: Payer: Medicaid Other | Admitting: Neurology

## 2023-09-13 ENCOUNTER — Other Ambulatory Visit: Payer: Medicaid Other

## 2023-09-13 VITALS — BP 105/68 | HR 74 | Ht 65.0 in | Wt 166.0 lb

## 2023-09-13 DIAGNOSIS — G4486 Cervicogenic headache: Secondary | ICD-10-CM | POA: Diagnosis not present

## 2023-09-13 DIAGNOSIS — M542 Cervicalgia: Secondary | ICD-10-CM | POA: Diagnosis not present

## 2023-09-13 MED ORDER — CYCLOBENZAPRINE HCL 5 MG PO TABS: ORAL_TABLET | ORAL | 6 refills | Status: AC

## 2023-09-13 NOTE — Progress Notes (Unsigned)
NEUROLOGY CONSULTATION NOTE  Jessica Robbins MRN: [id number] DOB: ***  Referring provider: *** Primary care provider: ***  Reason for consult:  ***  Dear Dr ***:  Thank you for your kind referral of Jessica Robbins for consultation of the above symptoms. Although *** history is well known to you, please allow me to reiterate it for the purpose of our medical record. The patient was accompanied to the clinic by *** who also provides collateral information. Records and images were personally reviewed where available.  HISTORY OF PRESENT ILLNESS: ***.  Jessica Robbins known for 3 yrs Jessica Robbins [id number] Right hand Pain first in right now on left Acc to husband, having a lot of HAs, he took her to ER bec she could not get up Not sure if she actually passed out HAs on the fronta Also has pain in shoulders, also has back pain; has 3 little children Denies any loss of consciousness HAs: 2-3 mos, with the pain some dizziness and nausea; when takes pain killers fels a little bit better Sometimes once or twice, yest very severe sponsor gave tablet and helped; tylenol ES Pain on left side, something bursting on my head;  A year ago, tooth on left lower jaw still hurt, impacted wisdom tooth, oral surgeon missed Sometimes has numbnes in arms, sometimes tingling both; other day worried had numbness in legs, knees having pain No bowel/bladder  Children affecting sleep otherwise sleeping ok, 5,3,24yo; still breastfeeding the 24yo No fhx of HAs Half-sister also has HAs, did not have it before coming here; [place] All the time, med causing prob with stomach, sometimes uses the wax/ointment applied on forehead for relief (Vicks); took for a few days  while child was in hospital over a week ago  Some dizziness and nausea when moving around and standing   07/29/23:she was sitting on the ground at their home with her family when she subsequently syncopized. He states that she was unconscious for 4 to 5 minutes but  breathing on her own. When he was able to arouse her she was very lethargic. Complaining of bilateral lower extremity numbness below the knees and sensation that her left arm and leg are heavy.   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 No sagging appearance of the brainstem. Other basilar cisterns appear normal. No dural thickening.   Solitary subcentimeter area of subcortical white matter T2 and FLAIR hyperintensity in the anterior right frontal lobe on series 11, image 40 is stable from last year, nonspecific, essentially within normal limits for age. CTA: Partial empty sella and narrowing of distal transverse sinuses near the transverse-sigmoid junction, which can be seen in the setting of idiopathic intracranial hypertension. TSH 5.299 CBC, CMP, UDS neg   Seizure symptoms: The patient denies any olfactory/gustatory hallucinations, deja vu, rising epigastric sensation, focal numbness/tingling/weakness, myoclonic jerks.  Epilepsy Risk Factors:  *** had a normal birth and early development.  There is no history of febrile convulsions, CNS infections such as meningitis/encephalitis, significant traumatic brain injury, neurosurgical procedures, or family history of seizures.  Prior AEDs: Laboratory Data:  EEGs: MRI:   PAST MEDICAL HISTORY: Past Medical History:  Diagnosis Date   Anemia affecting pregnancy in third trimester-folate/b12 and Fe deficiency 06/28/2021   CBC  Latest Ref Rng & Units  06/27/2021  03/06/2021  02/27/2021  WBC  3.4 - 10.8 x10E3/uL  8.6  8.5  8.9  Hemoglobin  11.1 - 15.9 g/dL  9.1(Y)  78.2  95.6  Hematocrit  34.0 - 46.6 %  29.4(L)  34.6  35.1  Platelets  150 - 450 x10E3/uL  302  329  324     Iron therapy recommended- Orders in signed held for venoferx2  Folate deficiency-- sent in Folic acid 1g  B12 deficiency- sent injection to pharmacy-   Hyperthyroidism    Urinary tract infection with hematuria 10/05/2022    PAST SURGICAL  HISTORY: Past Surgical History:  Procedure Laterality Date   NO PAST SURGERIES      MEDICATIONS: Current Outpatient Medications on File Prior to Visit  Medication Sig Dispense Refill   Norethindrone Acetate-Ethinyl Estrad-FE (LOESTRIN 24 FE) 1-20 MG-MCG(24) tablet Take 1 tablet by mouth daily. (Patient not taking: Reported on 07/29/2023) 60 tablet 6   triamcinolone cream (KENALOG) 0.1 % Apply 1 Application topically 2 (two) times daily. (Patient not taking: Reported on 07/29/2023) 80 g 1   No current facility-administered medications on file prior to visit.    ALLERGIES: No Known Allergies  FAMILY HISTORY: Family History  Problem Relation Age of Onset   Cancer Neg Hx    Diabetes Neg Hx    Hypertension Neg Hx     SOCIAL HISTORY: Social History   Socioeconomic History   Marital status: Married    Spouse name: Not on file   Number of children: 2   Years of education: Not on file   Highest education level: Not on file  Occupational History   Not on file  Tobacco Use   Smoking status: Never   Smokeless tobacco: Never  Vaping Use   Vaping status: Never Used  Substance and Sexual Activity   Alcohol use: Never   Drug use: Never   Sexual activity: Not Currently    Birth control/protection: Pill  Other Topics Concern   Not on file  Social History Narrative   Are you right handed or left handed? Right handed    Are you currently employed ? no   What is your current occupation?   Do you live at home alone? No    Who lives with you? Husband and kids   What type of home do you live in: 1 story or 2 story?  2 story with steps        Social Drivers of Health   Financial Resource Strain: Not on file  Food Insecurity: No Food Insecurity (12/25/2021)   Hunger Vital Sign    Worried About Running Out of Food in the Last Year: Never true    Ran Out of Food in the Last Year: Never true  Transportation Needs: No Transportation Needs (12/25/2021)   PRAPARE - Therapist, art (Medical): No    Lack of Transportation (Non-Medical): No  Physical Activity: Not on file  Stress: Not on file  Social Connections: Not on file  Intimate Partner Violence: Not on file     PHYSICAL EXAM: Vitals:   09/13/23 1012  BP: 105/68  Pulse: 74  SpO2: 98%   General: No acute distress Head:  Normocephalic/atraumatic Skin/Extremities: No rash, no edema Neurological Exam: Mental status: alert and oriented to person, place, and time, no dysarthria or aphasia, Fund of knowledge is appropriate.  Recent and remote memory are intact.  Attention and concentration are normal.    Able to name objects and repeat phrases. Cranial nerves: CN I: not tested CN II: pupils equal, round and reactive to light, visual fields intact CN III, IV, VI:  full range of motion, no nystagmus, no ptosis CN V: facial sensation  intact CN VII: upper and lower face symmetric CN VIII: hearing intact to conversation Bulk & Tone: normal, no fasciculations. Motor: 5/5 throughout with no pronator drift. Sensation: intact to light touch, cold, pin, vibration and joint position sense.  No extinction to double simultaneous stimulation.  Romberg test *** Deep Tendon Reflexes: +2 throughout, no ankle clonus Plantar responses: downgoing bilaterally Cerebellar: no incoordination on finger to nose, heel to shin. No dysdiadochokinesia Gait: narrow-based and steady, able to tandem walk adequately. Tremor: ***   IMPRESSION: This is a *** year old ***-handed *** with a history of ***.  [place] driving laws were discussed with the patient, and *** knows to stop driving after a seizure, until 6 months seizure-free.    The duration of this appointment visit was *** minutes of face-to-face time with the patient.  Greater than 50% of this time was spent in counseling, explanation of diagnosis, planning of further management, and coordination of care.  Thank you for allowing me to participate in the care of  this patient. Please do not hesitate to call for any questions or concerns.   Patrcia Dolly, M.D.  CC: ***

## 2023-09-13 NOTE — Patient Instructions (Addendum)
?? ???????.    1. Flexeril 5mg  ??? ???: ??? ??? 1 ?????? ????? ???? ? ?? ??? ?? ? ???? / ???? ??? ??? ????? ????  2. ? ESR? ANA ????? ? ???? ??? ????? ???  3. ?? 4 ?????? ?? ????? ???? ? ?? ??? ????? ?????? ???? kha raghlast.  1. Flexeril 5mg  pel kri: valara shpa 1 tableet wakhle tarso da sar dard ao da ghary / ugo dard sara marsta wakri  2. da ESR, ANA lapara da weny kar tarsara kri  3. paa 4 miashto ke taqib kri, da valar dol badloon ghukhtana wakri    Good to meet you.  Start Flexeril 5mg : take 1 tablet every night to help with headaches and neck/shoulder pain  2. Have bloodwork done for ESR, ANA  3. Follow-up in 4 months, call for any changes

## 2023-09-15 LAB — ANA: Anti Nuclear Antibody (ANA): POSITIVE — AB

## 2023-09-15 LAB — ANTI-NUCLEAR AB-TITER (ANA TITER): ANA Titer 1: 1:40 {titer} — ABNORMAL HIGH

## 2023-09-15 LAB — SEDIMENTATION RATE: Sed Rate: 25 mm/h — ABNORMAL HIGH (ref 0–20)

## 2023-09-19 ENCOUNTER — Telehealth: Payer: Self-pay | Admitting: *Deleted

## 2023-09-19 DIAGNOSIS — N912 Amenorrhea, unspecified: Secondary | ICD-10-CM

## 2023-09-19 NOTE — Telephone Encounter (Signed)
 Received message from [place] (pt sponsor) who called on behalf of Jessica Robbins stating that she needs medication refills of birth control (Norethindrone) and Triamcinolone cream.

## 2023-09-23 ENCOUNTER — Encounter: Payer: Self-pay | Admitting: Neurology

## 2023-09-26 MED ORDER — NORETHIN ACE-ETH ESTRAD-FE 1-20 MG-MCG(24) PO TABS: 1.0000 | ORAL_TABLET | Freq: Every day | ORAL | 1 refills | Status: DC

## 2023-09-26 NOTE — Telephone Encounter (Signed)
 Per chart was seen last 10/18/22. I called patient with [employer] [phone] and number was for her sponsor Almarie- signed DPR in chart for [facility] noted. I informed Almarie I am returning call and was calling for Jessica Robbins She confirms the number I called is Jessica Robbins's number but Jessica Robbins number is [phone] and that she can not be reached at the moment. I asked her to let Jessica Robbins know we sent in rx for 3 months of birth control and she will need to schedule an appointment for annual exam. I also explained we did not prescribe triminalone cream and to contact that doctor. She voices understanding and that she will contact Jessica Robbins with the information and will schedule the annual exam. Rock Skip PEAK

## 2023-09-27 ENCOUNTER — Telehealth: Payer: Self-pay

## 2023-09-27 NOTE — Telephone Encounter (Signed)
 Pt called no answer per DPR left a voice mail that  bloodwork was overall okay

## 2023-09-27 NOTE — Telephone Encounter (Signed)
-----   Message from Van Clines sent at 09/23/2023 12:37 PM EST ----- Pls let her sponsor know the bloodwork was overall okay. Thanks

## 2023-11-05 ENCOUNTER — Telehealth: Payer: Self-pay | Admitting: Obstetrics and Gynecology

## 2023-11-05 NOTE — Telephone Encounter (Signed)
Called patient with [employer] [phone]. Jessica Robbins, her sponsor answered and stated since she was bringing her, it depends on the weather. She said if she can't make it she would call the office.

## 2023-11-06 ENCOUNTER — Ambulatory Visit: Payer: Medicaid Other | Admitting: Obstetrics and Gynecology

## 2023-11-18 ENCOUNTER — Other Ambulatory Visit: Payer: Self-pay | Admitting: Obstetrics and Gynecology

## 2023-11-18 DIAGNOSIS — N912 Amenorrhea, unspecified: Secondary | ICD-10-CM

## 2023-11-28 ENCOUNTER — Ambulatory Visit: Payer: Medicaid Other | Admitting: Advanced Practice Midwife

## 2023-12-16 NOTE — Progress Notes (Unsigned)
 ANNUAL EXAM Patient name: Jessica Robbins MRN [id number]  Date of birth: February 25, 2001 Chief Complaint:   No chief complaint on file.  History of Present Illness:   Jessica Robbins is a 23 y.o. [phone] {race:25618} female being seen today for a routine annual exam.  Current complaints: ***  No LMP recorded. (Menstrual status: Lactating).   The pregnancy intention screening data noted above was reviewed. Potential methods of contraception were discussed. The patient elected to proceed with No data recorded.   Last pap 03/06/2021 NILM, HR HPV negative. H/O abnormal pap: {yes/yes***/no:23866} Last mammogram: Not indicated due to age. Results were: {normal, abnormal, n/a:23837}. Family h/o breast cancer: {yes***/no:23838} Last colonoscopy: Not indicated due to age. Results were: {normal, abnormal, n/a:23837}. Family h/o colorectal cancer: {yes***/no:23838}     02/21/2023    3:19 PM 10/23/2022    9:19 AM 10/18/2022    9:59 AM 12/25/2021   11:34 AM 09/01/2021   10:27 AM  Depression screen PHQ 2/9  Decreased Interest 0 0 0 0 0  Down, Depressed, Hopeless 0 0 0 0 0  PHQ - 2 Score 0 0 0 0 0  Altered sleeping 1 0 0 0 0  Tired, decreased energy 0 0 0 0 2  Change in appetite 0 0 0 0 0  Feeling bad or failure about yourself  0 0 0 0 0  Trouble concentrating 0 0 0 0 0  Moving slowly or fidgety/restless 0 0 0 0 0  Suicidal thoughts 0 0 0  0  PHQ-9 Score 1 0 0 0 2  Difficult doing work/chores  Not difficult at all   Not difficult at all        10/18/2022    9:59 AM 12/25/2021   11:34 AM 09/01/2021   10:28 AM 08/25/2021   12:06 PM  GAD 7 : Generalized Anxiety Score  Nervous, Anxious, on Edge 0 0 0 0  Control/stop worrying 0 0 0 0  Worry too much - different things 0 0 0 0  Trouble relaxing 0 0 0 0  Restless 0 0 0 0  Easily annoyed or irritable 0 0 0 0  Afraid - awful might happen 0 0 0 0  Total GAD 7 Score 0 0 0 0  Anxiety Difficulty   Not difficult at all      Review of Systems:   Pertinent  items are noted in HPI Denies any headaches, blurred vision, fatigue, shortness of breath, chest pain, abdominal pain, abnormal vaginal discharge/itching/odor/irritation, problems with periods, bowel movements, urination, or intercourse unless otherwise stated above. Pertinent History Reviewed:  Reviewed past medical,surgical, social and family history.  Reviewed problem list, medications and allergies. Physical Assessment:  There were no vitals filed for this visit.There is no height or weight on file to calculate BMI.        Physical Examination:   General appearance - well appearing, and in no distress  Mental status - alert, oriented to person, place, and time  Psych:  She has a normal mood and affect  Skin - warm and dry, normal color, no suspicious lesions noted  Chest - effort normal, all lung fields clear to auscultation bilaterally  Heart - normal rate and regular rhythm  Neck:  midline trachea, no thyromegaly or nodules  Breasts - breasts appear normal, no suspicious masses, no skin or nipple changes or  axillary nodes  Abdomen - soft, nontender, nondistended, no masses or organomegaly  Pelvic - VULVA: normal appearing vulva with no masses, tenderness  or lesions  VAGINA: normal appearing vagina with normal color and discharge, no lesions  CERVIX: normal appearing cervix without discharge or lesions, no CMT  Thin prep pap is {Desc; done/not:10129} *** HR HPV cotesting  UTERUS: uterus is felt to be normal size, shape, consistency and nontender   ADNEXA: No adnexal masses or tenderness noted.  Extremities:  No swelling or varicosities noted  Chaperone present for exam  No results found for this or any previous visit (from the past 24 hours).  Assessment & Plan:  1. Encounter for annual routine gynecological examination (Primary) 2. Cervical cancer screening  - Cervical cancer screening: Discussed screening Q3 years. Reviewed importance of annual exams and limits of pap smear. Pap  with reflex HPV *** - GC/CT: Discussed and recommended. Pt  {Blank single:19197::"accepts","declines"} - Gardasil: {Blank single:19197::"***","has not yet had. Will provide information","completed","has not yet had. Counseling provided and she declines","Has not yet had. Counseling provided and pt accepts"} - Birth Control: {Birth control type:23956} - Breast Health: Encouraged self breast awareness/exams. Teaching provided. - Mammogram: {Mammo f/u:25212::"@ 25yo"}, or sooner if problems - Colonoscopy: {TCS f/u:25213::"@ 25yo"}, or sooner if problems - Follow-up: 12 months and prn   No orders of the defined types were placed in this encounter.  Meds: No orders of the defined types were placed in this encounter.  Follow-up: No follow-ups on file.  Ralene Muskrat, [place] 12/16/2023 5:15 PM

## 2023-12-19 ENCOUNTER — Ambulatory Visit: Admitting: Physician Assistant

## 2023-12-19 VITALS — BP 122/73 | HR 77 | Ht 65.0 in | Wt 165.8 lb

## 2023-12-19 DIAGNOSIS — N644 Mastodynia: Secondary | ICD-10-CM | POA: Diagnosis not present

## 2023-12-19 DIAGNOSIS — Z01419 Encounter for gynecological examination (general) (routine) without abnormal findings: Secondary | ICD-10-CM

## 2023-12-19 DIAGNOSIS — Z3202 Encounter for pregnancy test, result negative: Secondary | ICD-10-CM | POA: Diagnosis not present

## 2023-12-19 DIAGNOSIS — Z124 Encounter for screening for malignant neoplasm of cervix: Secondary | ICD-10-CM

## 2023-12-19 LAB — POCT PREGNANCY, URINE: Preg Test, Ur: NEGATIVE

## 2023-12-19 MED ORDER — NORETHINDRONE 0.35 MG PO TABS: 1.0000 | ORAL_TABLET | Freq: Every day | ORAL | 11 refills | Status: AC

## 2023-12-27 ENCOUNTER — Telehealth: Payer: Self-pay

## 2023-12-27 NOTE — Telephone Encounter (Signed)
 Patient's sponsor [name] called RN line regarding previous appointment in our office. Reports that patient had UPT in office and based on that results, patient was told that the physician would prescribe patient some medication. [name] would like a call back regarding the medication.   1148 Called patient's sponsor and verified DPR. Elizabeth verified patient's 2 identifiers. Request the review of patient's medication for birth control. Informed that patient has medications sent to her pharmacy's [facility] and should be ready for pick up.  Marcelino Duster, RN

## 2024-01-20 ENCOUNTER — Ambulatory Visit: Payer: Medicaid Other | Admitting: Neurology

## 2024-01-22 ENCOUNTER — Ambulatory Visit
Admission: RE | Admit: 2024-01-22 | Discharge: 2024-01-22 | Disposition: A | Discharge status: Home / Self Care | Source: Ambulatory Visit | Attending: Physician Assistant | Admitting: Physician Assistant

## 2024-01-22 DIAGNOSIS — N644 Mastodynia: Secondary | ICD-10-CM

## 2024-01-22 DIAGNOSIS — N6310 Unspecified lump in the right breast, unspecified quadrant: Secondary | ICD-10-CM | POA: Diagnosis not present

## 2024-01-28 ENCOUNTER — Ambulatory Visit: Payer: Medicaid Other | Admitting: Neurology

## 2024-04-07 ENCOUNTER — Other Ambulatory Visit: Payer: Self-pay | Admitting: Family Medicine

## 2024-04-07 DIAGNOSIS — L301 Dyshidrosis [pompholyx]: Secondary | ICD-10-CM

## 2024-06-01 ENCOUNTER — Ambulatory Visit: Admitting: Family Medicine

## 2024-06-15 ENCOUNTER — Ambulatory Visit (INDEPENDENT_AMBULATORY_CARE_PROVIDER_SITE_OTHER): Admitting: Family Medicine

## 2024-06-15 VITALS — BP 104/62 | HR 84 | Ht 65.0 in | Wt 172.4 lb

## 2024-06-15 DIAGNOSIS — R21 Rash and other nonspecific skin eruption: Secondary | ICD-10-CM

## 2024-06-15 MED ORDER — DOXYCYCLINE HYCLATE 100 MG PO TABS: 100.0000 mg | ORAL_TABLET | Freq: Two times a day (BID) | ORAL | 0 refills | Status: AC

## 2024-06-15 NOTE — Patient Instructions (Addendum)
 Thank you for coming in today! Here is a summary of what we discussed:  -We will try Doxycycline twice daily for 2 weeks for your rash.   -You should use a gentle cleanser for your face (Cerave, Cetaphil) and unscented lotions to help sooth the skin  -Please make an appt for follow up in 2-4 weeks to check on the rash and complete health maintenance tasks  Please call the clinic at [phone] if your symptoms worsen or you have any concerns.  Best, Dr Adele

## 2024-06-15 NOTE — Progress Notes (Signed)
    SUBJECTIVE:   CHIEF COMPLAINT / HPI:   Rash -on hands, back, scalp, face, breasts -several months -very itchy, scratches a lot -worse when it is warm -no exposures, no sick contacts -no recent illness, no fevers, feels well otherwise -tried a dandruff and Selsun blue shampoo, does not work longer than 30 minutes. Was using this daily -KOH scraping in Jan 24 negative -has not oral medicines for this  -Patient is no longer breast-feeding.  Just finished her menstrual cycle on 9/26   PERTINENT  PMH / PSH: Hypothyroid, low back pain  OBJECTIVE:   BP 104/62   Pulse 84   Ht 5' 5 (1.651 m)   Wt 172 lb 6.4 oz (78.2 kg)   LMP 06/12/2024   SpO2 97%   BMI 28.69 kg/m   General: Awake and conversant, no acute distress Pulm: normal work of breathing on room air, no W/R/R. Skin: Clusters of 3 to 4 mm erythematous pustules on nape of neck, shoulders, face, and fingers.  Acne with scarring on face.  No lesions on palms or soles Neuro: No focal deficits Psych: Appropriate mood and affect   ASSESSMENT/PLAN:   Assessment & Plan Rash of both hands Rash of back Diffuse pustular and pruritic rash on scalp, trunk, face, extremities.  Workup in the past including negative KOH scraping, treatment with Selsun Blue/dandruff shampoo, topical corticosteroids with limited improvement in symptoms for patient.   --Will trial oral doxycycline to treat presumptive acne versus folliculitis.  -- Patient has also been using topical tretinoin (unsure strength/frequency), advised continued use of this for facial acne --Also advised gentle, unscented soaps/lotions --Advised follow-up in 2 to 3 weeks with PCP to monitor for symptom improvement and complete health maintenance tasks.     Jessica Raring, MD [facility]

## 2024-07-06 NOTE — Progress Notes (Unsigned)
    SUBJECTIVE:   CHIEF COMPLAINT / HPI:   F/u Rash ***   Hindel 9/29: Rash of both hands Rash of back Diffuse pustular and pruritic rash on scalp, trunk, face, extremities.  Workup in the past including negative KOH scraping, treatment with Selsun Blue/dandruff shampoo, topical corticosteroids with limited improvement in symptoms for patient.   --Will trial oral doxycycline to treat presumptive acne versus folliculitis.  -- Patient has also been using topical tretinoin (unsure strength/frequency), advised continued use of this for facial acne --Also advised gentle, unscented soaps/lotions --Advised follow-up in 2 to 3 weeks with PCP to monitor for symptom improvement and complete health maintenance tasks.  PERTINENT  PMH / PSH: Reviewed. Dyshidrotic eczema  OBJECTIVE:   LMP 06/12/2024   General: ***-appearing, no acute distress. HEENT: normocephalic, PERRLA, EOM grossly intact, MMM, bilateral TM visualized without erythema or bulging. Cardio: Regular rate, *** rhythm, no murmurs on exam. Pulm: Clear, no wheezing, no crackles. No increased work of breathing. Abdominal: bowel sounds present, soft, non-tender, non-distended. No HSM. Extremities: no peripheral edema. Moves all extremities equally. Neuro: Alert and oriented x3, speech normal in content, no facial asymmetry, strength intact and equal bilaterally in UE and LE, pupils equal and reactive to light.  Psych:  Cognition and judgment appear intact. Alert, communicative, and cooperative with normal attention span and concentration. No apparent delusions, illusions, hallucinations    ASSESSMENT/PLAN:   Assessment & Plan      Lauraine Norse, DO [facility]

## 2024-07-07 ENCOUNTER — Ambulatory Visit: Admitting: Family Medicine

## 2024-08-11 ENCOUNTER — Encounter: Payer: Self-pay | Admitting: Family Medicine

## 2024-08-11 ENCOUNTER — Ambulatory Visit: Admitting: Family Medicine

## 2024-08-11 VITALS — BP 111/65 | HR 85 | Wt 170.4 lb

## 2024-08-11 DIAGNOSIS — L282 Other prurigo: Secondary | ICD-10-CM | POA: Diagnosis not present

## 2024-08-11 DIAGNOSIS — L858 Other specified epidermal thickening: Secondary | ICD-10-CM | POA: Diagnosis not present

## 2024-08-11 MED ORDER — CLINDAMYCIN PHOS (TWICE-DAILY) 1 % EX GEL: Freq: Two times a day (BID) | CUTANEOUS | 3 refills | Status: AC

## 2024-08-11 NOTE — Progress Notes (Signed)
    SUBJECTIVE:  Accompanied by husband and young son, husband provides interpretation today.  CHIEF COMPLAINT / HPI:   Rash follow up Seen in this clinic 9/29 by Dr. Adele for a rash on back and hands. Several treatments tried previously and KOH scraping negative - at last visit trial of PO doxy for presumptive acne versus folliculitis. Today she tells me she has not been using any treatments for this for some time. Did take doxy for 2 weeks but then stopped (thought this was instructions). Itchy, dry skin.  Due for cervical cancer screening.  PERTINENT  PMH / PSH: Reviewed.  OBJECTIVE:   BP 111/65   Pulse 85   Wt 170 lb 6.4 oz (77.3 kg)   SpO2 99%   BMI 28.36 kg/m   General: well-appearing, no acute distress. HEENT: normocephalic, PERRLA, EOM grossly intact, MMM Cardio: Regular rate, regular rhythm, no murmurs on exam. Pulm: Clear, no wheezing, no crackles. No increased work of breathing. Extremities: no peripheral edema. Moves all extremities equally. Skin: small bumps in broad distribution across back, neck, chest, extremities, as well as into hairline and scalp. Some with hyperkeratotic appearance. Excoriations. Neuro: Alert and oriented x3, speech normal in content Psych:  Cognition and judgment appear intact.   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 - rx'd clindgel to use BID on all affected areas - supportive care instructions given, including gentle cleanser and an OTC lotion for KP - RPR today - if not improving in 2 weeks she will return for re-evaluation. Would be good candidate for scheduling in our skin clinic.    Lauraine Norse, DO [facility]

## 2024-08-11 NOTE — Assessment & Plan Note (Signed)
 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 - rx'd clindgel to use BID on all affected areas - supportive care instructions given, including gentle cleanser and an OTC lotion for KP - RPR today - if not improving in 2 weeks she will return for re-evaluation. Would be good candidate for scheduling in our skin clinic.

## 2024-08-11 NOTE — Patient Instructions (Addendum)
 Start using prescription cream once a day to all areas with rash. Wash your face and body with a gentle soap. Do not scrub this skin as this can cause more irritation. Also buy a lotion from the store to help heal dry, rough skin. Use this 1-2 times a day. Some examples are below:  ??? ??? ? ???? ???? ? ?????? ? ?????? ???? ???? ?? ??????.   ??? ?? ?? ??? ? ???? ???????? ??? ?????. ?? ?????? ?? ???? ?? ??? ??? ?? ?? ???? ?? ????? ????? ??????? ???.   ???????? ?? ????? ??? ?? ???? ????? ?? ?? ?? ?? ??? ?????? ?? ????? ????. ?? ?-? ??? ?? ??? ?? ??????. ???? ??????? ????? ??:

## 2024-08-12 ENCOUNTER — Ambulatory Visit: Payer: Self-pay | Admitting: Family Medicine

## 2024-08-12 LAB — BASIC METABOLIC PANEL WITH GFR
BUN/Creatinine Ratio: 19 (ref 9–23)
BUN: 13 mg/dL (ref 6–20)
CO2: 20 mmol/L (ref 20–29)
Calcium: 9.8 mg/dL (ref 8.7–10.2)
Chloride: 104 mmol/L (ref 96–106)
Creatinine, Ser: 0.69 mg/dL (ref 0.57–1.00)
Glucose: 102 mg/dL — ABNORMAL HIGH (ref 70–99)
Potassium: 4 mmol/L (ref 3.5–5.2)
Sodium: 142 mmol/L (ref 134–144)
eGFR: 123 mL/min/1.73 (ref >59)

## 2024-08-12 LAB — SYPHILIS: RPR W/REFLEX TO RPR TITER AND TREPONEMAL ANTIBODIES, TRADITIONAL SCREENING AND DIAGNOSIS ALGORITHM: RPR Ser Ql: NONREACTIVE

## 2024-10-06 ENCOUNTER — Telehealth: Payer: Self-pay | Admitting: Family Medicine

## 2024-10-06 NOTE — Telephone Encounter (Signed)
Disregard this error
